# Patient Record
Sex: Female | Born: 1985 | Race: Black or African American | Hispanic: No | Smoking: Never smoker
Health system: Southern US, Community
[De-identification: ages and names within clinical notes are randomized; demographics above are authoritative.]

## PROBLEM LIST (undated history)

## (undated) ENCOUNTER — Inpatient Hospital Stay (HOSPITAL_COMMUNITY): Payer: Self-pay

## (undated) ENCOUNTER — Emergency Department (HOSPITAL_COMMUNITY): Payer: 59

## (undated) DIAGNOSIS — N83209 Unspecified ovarian cyst, unspecified side: Secondary | ICD-10-CM

## (undated) DIAGNOSIS — Z90710 Acquired absence of both cervix and uterus: Secondary | ICD-10-CM

## (undated) DIAGNOSIS — N760 Acute vaginitis: Secondary | ICD-10-CM

## (undated) DIAGNOSIS — B9689 Other specified bacterial agents as the cause of diseases classified elsewhere: Secondary | ICD-10-CM

## (undated) DIAGNOSIS — N736 Female pelvic peritoneal adhesions (postinfective): Secondary | ICD-10-CM

## (undated) DIAGNOSIS — IMO0002 Reserved for concepts with insufficient information to code with codable children: Secondary | ICD-10-CM

## (undated) DIAGNOSIS — R87619 Unspecified abnormal cytological findings in specimens from cervix uteri: Secondary | ICD-10-CM

## (undated) DIAGNOSIS — Z9289 Personal history of other medical treatment: Secondary | ICD-10-CM

## (undated) DIAGNOSIS — Z87442 Personal history of urinary calculi: Secondary | ICD-10-CM

## (undated) DIAGNOSIS — F410 Panic disorder [episodic paroxysmal anxiety] without agoraphobia: Secondary | ICD-10-CM

## (undated) DIAGNOSIS — B009 Herpesviral infection, unspecified: Secondary | ICD-10-CM

## (undated) DIAGNOSIS — J302 Other seasonal allergic rhinitis: Secondary | ICD-10-CM

## (undated) DIAGNOSIS — R51 Headache: Secondary | ICD-10-CM

## (undated) DIAGNOSIS — D649 Anemia, unspecified: Secondary | ICD-10-CM

## (undated) HISTORY — DX: Reserved for concepts with insufficient information to code with codable children: IMO0002

## (undated) HISTORY — DX: Personal history of urinary calculi: Z87.442

## (undated) HISTORY — PX: WISDOM TOOTH EXTRACTION: SHX21

## (undated) HISTORY — DX: Acute vaginitis: N76.0

## (undated) HISTORY — DX: Other specified bacterial agents as the cause of diseases classified elsewhere: B96.89

## (undated) HISTORY — PX: ABDOMINAL HYSTERECTOMY: SHX81

## (undated) HISTORY — DX: Unspecified abnormal cytological findings in specimens from cervix uteri: R87.619

---
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
## 2005-09-14 ENCOUNTER — Emergency Department (HOSPITAL_COMMUNITY): Admission: EM | Admit: 2005-09-14 | Discharge: 2005-09-15 | Payer: Self-pay | Admitting: Emergency Medicine

## 2005-10-28 ENCOUNTER — Emergency Department (HOSPITAL_COMMUNITY): Admission: EM | Admit: 2005-10-28 | Discharge: 2005-10-28 | Payer: Self-pay | Admitting: Emergency Medicine

## 2006-04-19 ENCOUNTER — Emergency Department (HOSPITAL_COMMUNITY): Admission: EM | Admit: 2006-04-19 | Discharge: 2006-04-19 | Payer: Self-pay | Admitting: Emergency Medicine

## 2006-11-09 ENCOUNTER — Emergency Department (HOSPITAL_COMMUNITY): Admission: EM | Admit: 2006-11-09 | Discharge: 2006-11-09 | Payer: Self-pay | Admitting: Emergency Medicine

## 2006-11-12 ENCOUNTER — Emergency Department (HOSPITAL_COMMUNITY): Admission: EM | Admit: 2006-11-12 | Discharge: 2006-11-12 | Payer: Self-pay | Admitting: *Deleted

## 2007-05-02 ENCOUNTER — Ambulatory Visit (HOSPITAL_COMMUNITY): Admission: RE | Admit: 2007-05-02 | Discharge: 2007-05-02 | Payer: Self-pay | Admitting: Internal Medicine

## 2008-08-13 ENCOUNTER — Inpatient Hospital Stay (HOSPITAL_COMMUNITY): Admission: AD | Admit: 2008-08-13 | Discharge: 2008-08-13 | Payer: Self-pay | Admitting: Obstetrics & Gynecology

## 2008-08-29 ENCOUNTER — Emergency Department (HOSPITAL_COMMUNITY): Admission: EM | Admit: 2008-08-29 | Discharge: 2008-08-29 | Payer: Self-pay | Admitting: Emergency Medicine

## 2008-10-26 ENCOUNTER — Inpatient Hospital Stay (HOSPITAL_COMMUNITY): Admission: AD | Admit: 2008-10-26 | Discharge: 2008-10-26 | Payer: Self-pay | Admitting: Obstetrics and Gynecology

## 2008-11-30 ENCOUNTER — Ambulatory Visit (HOSPITAL_COMMUNITY): Admission: RE | Admit: 2008-11-30 | Discharge: 2008-11-30 | Payer: Self-pay | Admitting: Obstetrics and Gynecology

## 2009-01-09 ENCOUNTER — Inpatient Hospital Stay (HOSPITAL_COMMUNITY): Admission: AD | Admit: 2009-01-09 | Discharge: 2009-01-09 | Payer: Self-pay | Admitting: Obstetrics and Gynecology

## 2009-03-30 ENCOUNTER — Emergency Department (HOSPITAL_COMMUNITY): Admission: EM | Admit: 2009-03-30 | Discharge: 2009-03-30 | Payer: Self-pay | Admitting: Emergency Medicine

## 2009-04-08 ENCOUNTER — Observation Stay (HOSPITAL_COMMUNITY): Admission: AD | Admit: 2009-04-08 | Discharge: 2009-04-08 | Payer: Self-pay | Admitting: Obstetrics and Gynecology

## 2009-04-12 ENCOUNTER — Inpatient Hospital Stay (HOSPITAL_COMMUNITY): Admission: AD | Admit: 2009-04-12 | Discharge: 2009-04-17 | Payer: Self-pay | Admitting: Obstetrics and Gynecology

## 2009-04-14 ENCOUNTER — Encounter (INDEPENDENT_AMBULATORY_CARE_PROVIDER_SITE_OTHER): Payer: Self-pay | Admitting: Obstetrics and Gynecology

## 2009-04-22 ENCOUNTER — Inpatient Hospital Stay (HOSPITAL_COMMUNITY): Admission: AD | Admit: 2009-04-22 | Discharge: 2009-04-22 | Payer: Self-pay | Admitting: Obstetrics and Gynecology

## 2009-06-15 ENCOUNTER — Emergency Department (HOSPITAL_COMMUNITY): Admission: EM | Admit: 2009-06-15 | Discharge: 2009-06-15 | Payer: Self-pay | Admitting: Family Medicine

## 2009-09-09 ENCOUNTER — Emergency Department (HOSPITAL_COMMUNITY): Admission: EM | Admit: 2009-09-09 | Discharge: 2009-09-09 | Payer: Self-pay | Admitting: Emergency Medicine

## 2010-02-16 ENCOUNTER — Encounter: Payer: Self-pay | Admitting: Internal Medicine

## 2010-04-11 LAB — POCT I-STAT, CHEM 8
BUN: 14 mg/dL (ref 6–23)
Calcium, Ion: 1.21 mmol/L (ref 1.12–1.32)
Chloride: 107 mEq/L (ref 96–112)
Creatinine, Ser: 0.9 mg/dL (ref 0.4–1.2)
Glucose, Bld: 92 mg/dL (ref 70–99)
HCT: 43 % (ref 36.0–46.0)
Hemoglobin: 14.6 g/dL (ref 12.0–15.0)
Potassium: 3.9 mEq/L (ref 3.5–5.1)
Sodium: 141 mEq/L (ref 135–145)
TCO2: 25 mmol/L (ref 0–100)

## 2010-04-11 LAB — PREGNANCY, URINE: Preg Test, Ur: NEGATIVE

## 2010-04-14 LAB — CULTURE, ROUTINE-ABSCESS: Gram Stain: NONE SEEN

## 2010-04-21 LAB — CBC
HCT: 34.3 % — ABNORMAL LOW (ref 36.0–46.0)
HCT: 34.8 % — ABNORMAL LOW (ref 36.0–46.0)
HCT: 36.2 % (ref 36.0–46.0)
HCT: 37.7 % (ref 36.0–46.0)
Hemoglobin: 11.6 g/dL — ABNORMAL LOW (ref 12.0–15.0)
Hemoglobin: 11.8 g/dL — ABNORMAL LOW (ref 12.0–15.0)
Hemoglobin: 12.3 g/dL (ref 12.0–15.0)
Hemoglobin: 12.6 g/dL (ref 12.0–15.0)
MCHC: 33.4 g/dL (ref 30.0–36.0)
MCHC: 33.8 g/dL (ref 30.0–36.0)
MCHC: 33.9 g/dL (ref 30.0–36.0)
MCHC: 33.9 g/dL (ref 30.0–36.0)
MCV: 97.4 fL (ref 78.0–100.0)
MCV: 97.6 fL (ref 78.0–100.0)
MCV: 98.3 fL (ref 78.0–100.0)
MCV: 98.8 fL (ref 78.0–100.0)
Platelets: 199 10*3/uL (ref 150–400)
Platelets: 222 10*3/uL (ref 150–400)
Platelets: 228 10*3/uL (ref 150–400)
Platelets: 445 10*3/uL — ABNORMAL HIGH (ref 150–400)
RBC: 3.51 MIL/uL — ABNORMAL LOW (ref 3.87–5.11)
RBC: 3.54 MIL/uL — ABNORMAL LOW (ref 3.87–5.11)
RBC: 3.72 MIL/uL — ABNORMAL LOW (ref 3.87–5.11)
RBC: 3.81 MIL/uL — ABNORMAL LOW (ref 3.87–5.11)
RDW: 13.1 % (ref 11.5–15.5)
RDW: 13.9 % (ref 11.5–15.5)
RDW: 14.1 % (ref 11.5–15.5)
RDW: 14.2 % (ref 11.5–15.5)
WBC: 14.9 10*3/uL — ABNORMAL HIGH (ref 4.0–10.5)
WBC: 15.8 10*3/uL — ABNORMAL HIGH (ref 4.0–10.5)
WBC: 8.1 10*3/uL (ref 4.0–10.5)
WBC: 9 10*3/uL (ref 4.0–10.5)

## 2010-04-21 LAB — URINALYSIS, ROUTINE W REFLEX MICROSCOPIC
Bilirubin Urine: NEGATIVE
Glucose, UA: NEGATIVE mg/dL
Ketones, ur: NEGATIVE mg/dL
Leukocytes, UA: NEGATIVE
Nitrite: NEGATIVE
Protein, ur: NEGATIVE mg/dL
Specific Gravity, Urine: 1.025 (ref 1.005–1.030)
Urobilinogen, UA: 0.2 mg/dL (ref 0.0–1.0)
pH: 5.5 (ref 5.0–8.0)

## 2010-04-21 LAB — RPR
RPR Ser Ql: NONREACTIVE
RPR Ser Ql: NONREACTIVE

## 2010-04-21 LAB — URINALYSIS, DIPSTICK ONLY
Bilirubin Urine: NEGATIVE
Glucose, UA: 250 mg/dL — AB
Ketones, ur: NEGATIVE mg/dL
Leukocytes, UA: NEGATIVE
Nitrite: NEGATIVE
Protein, ur: NEGATIVE mg/dL
Specific Gravity, Urine: 1.01 (ref 1.005–1.030)
Urobilinogen, UA: 0.2 mg/dL (ref 0.0–1.0)
pH: 7 (ref 5.0–8.0)

## 2010-04-21 LAB — URINE MICROSCOPIC-ADD ON

## 2010-04-21 LAB — COMPREHENSIVE METABOLIC PANEL
ALT: 20 U/L (ref 0–35)
AST: 23 U/L (ref 0–37)
Albumin: 3.1 g/dL — ABNORMAL LOW (ref 3.5–5.2)
Alkaline Phosphatase: 104 U/L (ref 39–117)
BUN: 13 mg/dL (ref 6–23)
CO2: 23 mEq/L (ref 19–32)
Calcium: 9.4 mg/dL (ref 8.4–10.5)
Chloride: 108 mEq/L (ref 96–112)
Creatinine, Ser: 0.71 mg/dL (ref 0.4–1.2)
GFR calc Af Amer: >60 mL/min (ref >60)
GFR calc non Af Amer: >60 mL/min (ref >60)
Glucose, Bld: 75 mg/dL (ref 70–99)
Potassium: 4.2 mEq/L (ref 3.5–5.1)
Sodium: 140 mEq/L (ref 135–145)
Total Bilirubin: 0.7 mg/dL (ref 0.3–1.2)
Total Protein: 6.4 g/dL (ref 6.0–8.3)

## 2010-04-21 LAB — URINE CULTURE
Colony Count: NO GROWTH
Culture: NO GROWTH
Special Requests: NEGATIVE

## 2010-04-21 LAB — CCBB MATERNAL DONOR DRAW

## 2010-04-21 LAB — URIC ACID: Uric Acid, Serum: 5.2 mg/dL (ref 2.4–7.0)

## 2010-04-29 LAB — URINALYSIS, ROUTINE W REFLEX MICROSCOPIC
Bilirubin Urine: NEGATIVE
Glucose, UA: 500 mg/dL — AB
Hgb urine dipstick: NEGATIVE
Ketones, ur: NEGATIVE mg/dL
Nitrite: NEGATIVE
Protein, ur: NEGATIVE mg/dL
Specific Gravity, Urine: 1.015 (ref 1.005–1.030)
Urobilinogen, UA: 0.2 mg/dL (ref 0.0–1.0)
pH: 6.5 (ref 5.0–8.0)

## 2010-04-29 LAB — CBC
HCT: 32 % — ABNORMAL LOW (ref 36.0–46.0)
Hemoglobin: 11 g/dL — ABNORMAL LOW (ref 12.0–15.0)
MCHC: 34.3 g/dL (ref 30.0–36.0)
MCV: 98.9 fL (ref 78.0–100.0)
Platelets: 240 10*3/uL (ref 150–400)
RBC: 3.23 MIL/uL — ABNORMAL LOW (ref 3.87–5.11)
RDW: 12.8 % (ref 11.5–15.5)
WBC: 10.6 10*3/uL — ABNORMAL HIGH (ref 4.0–10.5)

## 2010-04-29 LAB — GLUCOSE, CAPILLARY: Glucose-Capillary: 93 mg/dL (ref 70–99)

## 2010-05-03 LAB — URINALYSIS, ROUTINE W REFLEX MICROSCOPIC
Bilirubin Urine: NEGATIVE
Glucose, UA: NEGATIVE mg/dL
Ketones, ur: NEGATIVE mg/dL
Nitrite: NEGATIVE
Protein, ur: 100 mg/dL — AB
Specific Gravity, Urine: 1.021 (ref 1.005–1.030)
Urobilinogen, UA: 1 mg/dL (ref 0.0–1.0)
pH: 6.5 (ref 5.0–8.0)

## 2010-05-03 LAB — WET PREP, GENITAL
Clue Cells Wet Prep HPF POC: NONE SEEN
Trich, Wet Prep: NONE SEEN
Yeast Wet Prep HPF POC: NONE SEEN

## 2010-05-03 LAB — RPR: RPR Ser Ql: NONREACTIVE

## 2010-05-03 LAB — GC/CHLAMYDIA PROBE AMP, GENITAL
Chlamydia, DNA Probe: NEGATIVE
GC Probe Amp, Genital: NEGATIVE

## 2010-05-03 LAB — PREGNANCY, URINE: Preg Test, Ur: POSITIVE

## 2010-05-03 LAB — URINE MICROSCOPIC-ADD ON

## 2010-05-04 LAB — DIFFERENTIAL
Band Neutrophils: 0 % (ref 0–10)
Basophils Absolute: 0 10*3/uL (ref 0.0–0.1)
Basophils Relative: 0 % (ref 0–1)
Eosinophils Absolute: 0 10*3/uL (ref 0.0–0.7)
Eosinophils Relative: 0 % (ref 0–5)
Lymphocytes Relative: 34 % (ref 12–46)
Lymphs Abs: 2.8 10*3/uL (ref 0.7–4.0)
Monocytes Absolute: 0.2 10*3/uL (ref 0.1–1.0)
Monocytes Relative: 3 % (ref 3–12)
Neutro Abs: 5.2 10*3/uL (ref 1.7–7.7)
Neutrophils Relative %: 63 % (ref 43–77)

## 2010-05-04 LAB — URINALYSIS, ROUTINE W REFLEX MICROSCOPIC
Bilirubin Urine: NEGATIVE
Glucose, UA: NEGATIVE mg/dL
Hgb urine dipstick: NEGATIVE
Ketones, ur: NEGATIVE mg/dL
Nitrite: NEGATIVE
Protein, ur: NEGATIVE mg/dL
Specific Gravity, Urine: 1.01 (ref 1.005–1.030)
Urobilinogen, UA: 0.2 mg/dL (ref 0.0–1.0)
pH: 7 (ref 5.0–8.0)

## 2010-05-04 LAB — ABO/RH: ABO/RH(D): O POS

## 2010-05-04 LAB — CBC
HCT: 34.2 % — ABNORMAL LOW (ref 36.0–46.0)
Hemoglobin: 11.9 g/dL — ABNORMAL LOW (ref 12.0–15.0)
MCHC: 34.8 g/dL (ref 30.0–36.0)
MCV: 94.8 fL (ref 78.0–100.0)
Platelets: 262 10*3/uL (ref 150–400)
RBC: 3.61 MIL/uL — ABNORMAL LOW (ref 3.87–5.11)
RDW: 11.7 % (ref 11.5–15.5)
WBC: 8.2 10*3/uL (ref 4.0–10.5)

## 2010-05-04 LAB — WET PREP, GENITAL
Trich, Wet Prep: NONE SEEN
Yeast Wet Prep HPF POC: NONE SEEN

## 2010-05-04 LAB — HCG, QUANTITATIVE, PREGNANCY: hCG, Beta Chain, Quant, S: 15776 m[IU]/mL — ABNORMAL HIGH (ref <5)

## 2010-06-06 DIAGNOSIS — G43909 Migraine, unspecified, not intractable, without status migrainosus: Secondary | ICD-10-CM | POA: Insufficient documentation

## 2010-10-25 IMAGING — US US OB TRANSVAGINAL MODIFY
1 series · 14 of 28 positions shown · non-contrast
Comparison: None

CLINICAL DATA: Early pregnancy, severe pain, no quantitative beta
HCG available for correlation

OBSTETRIC <14 WK US AND TRANSVAGINAL OB US
TECHNIQUE: Both transabdominal and transvaginal ultrasound
examinations were performed for complete evaluation of the
gestation as well as the maternal uterus, adnexal regions, and
pelvic cul-de-sac.

[Series 1: us ob comp less 14 wks · 0.24mm/px · 14 of 36 slices shown]
[im 2/36]
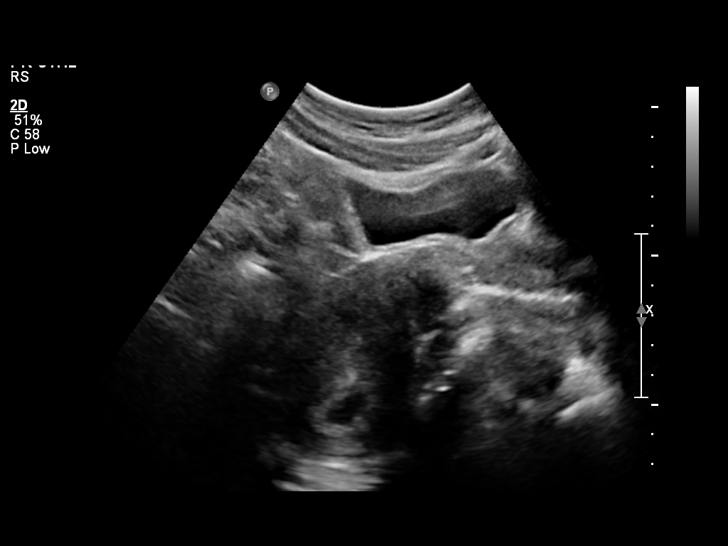
[im 4/36]
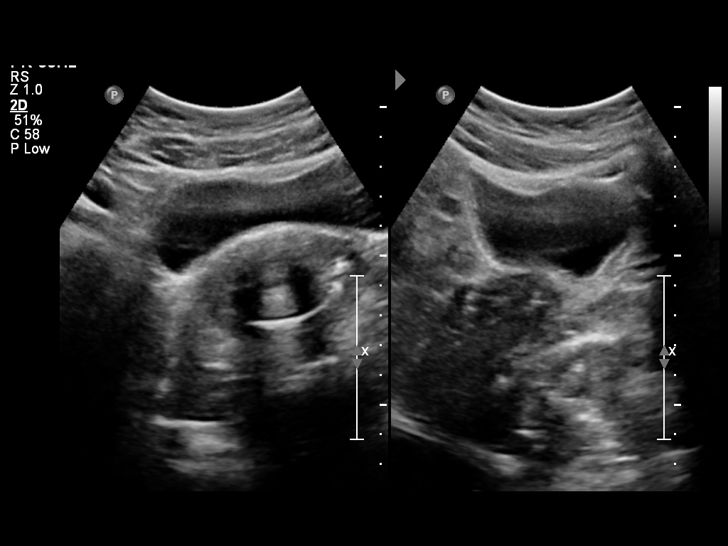
[im 7/36]
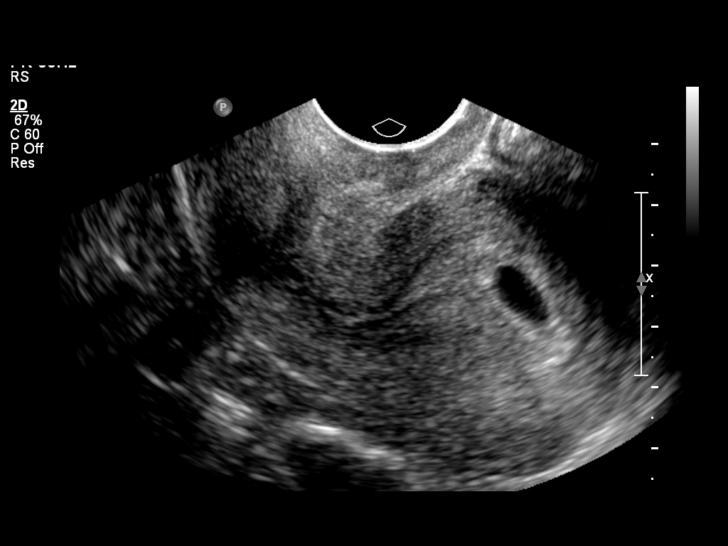
[im 10/36]
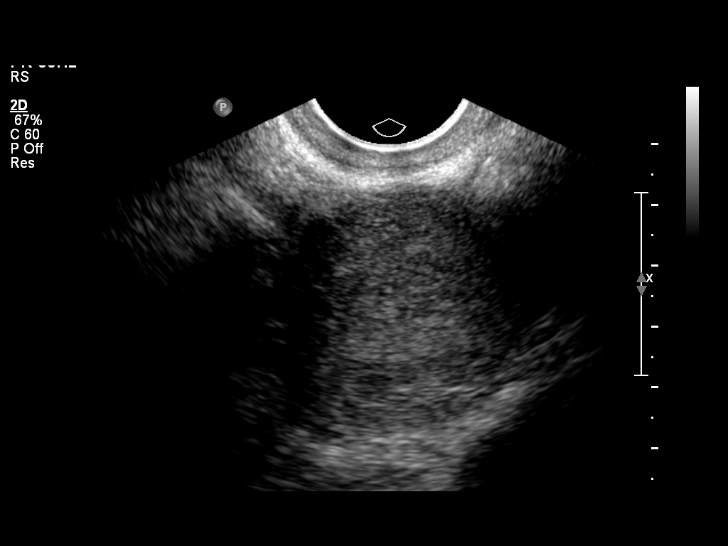
[im 12/36]
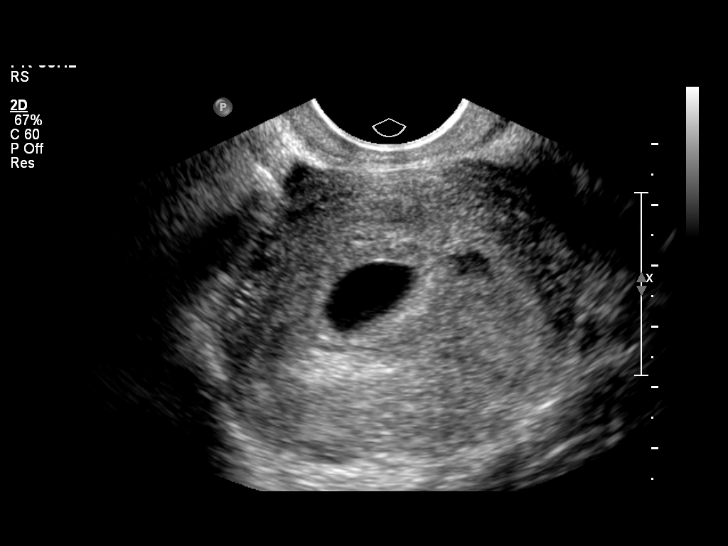
[im 15/36]
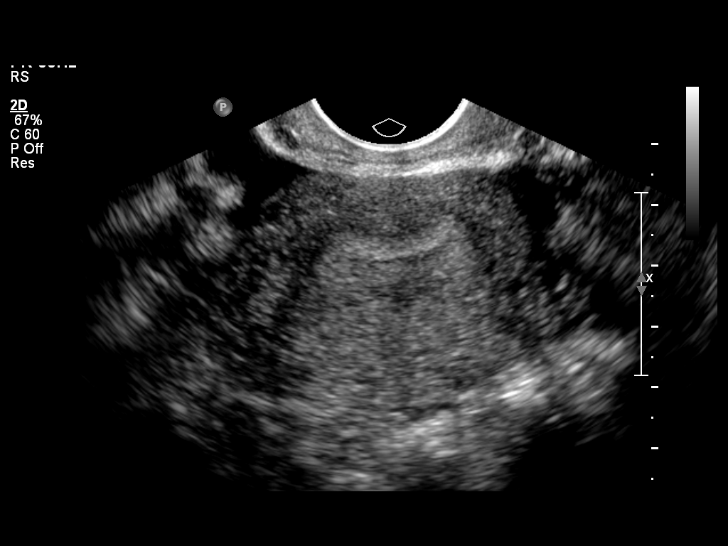
[im 17/36]
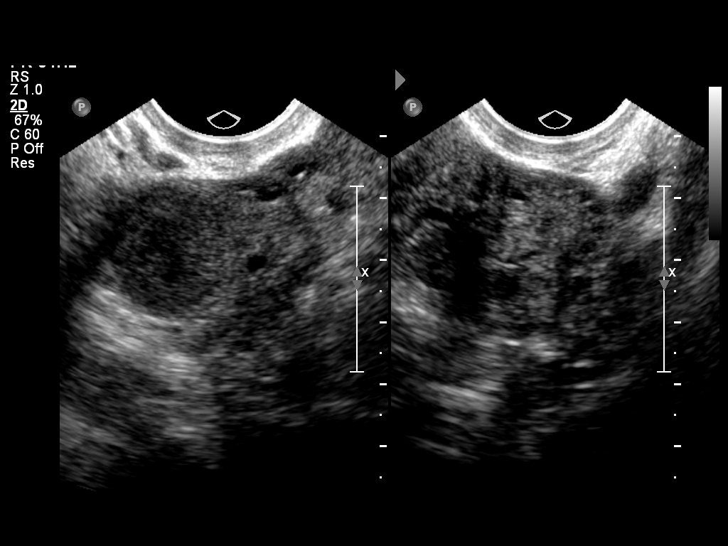
[im 20/36]
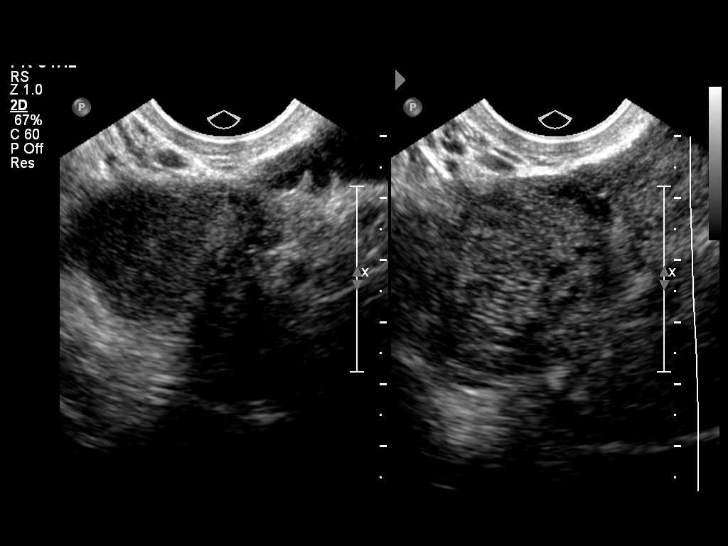
[im 23/36]
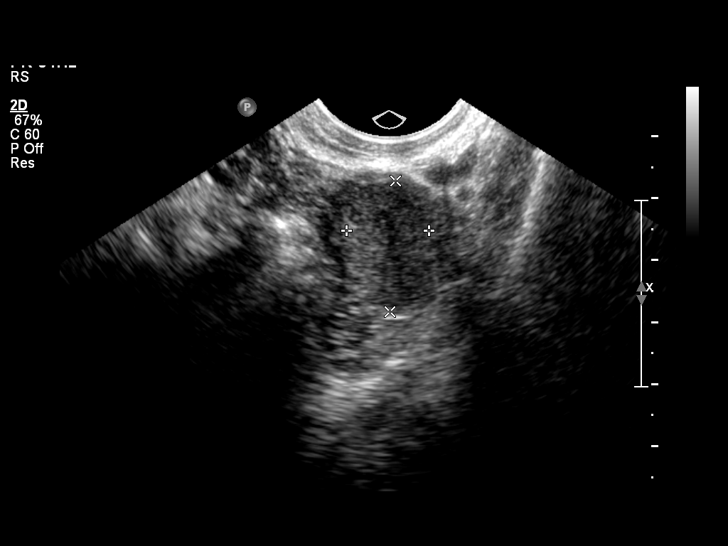
[im 25/36]
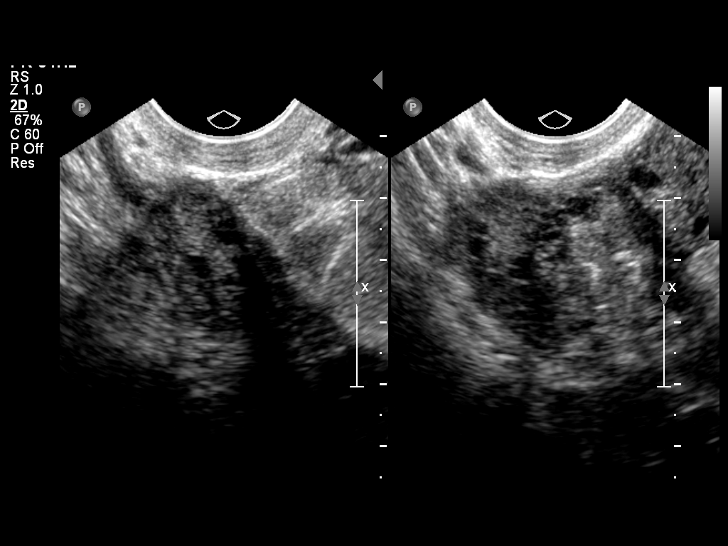
[im 28/36]
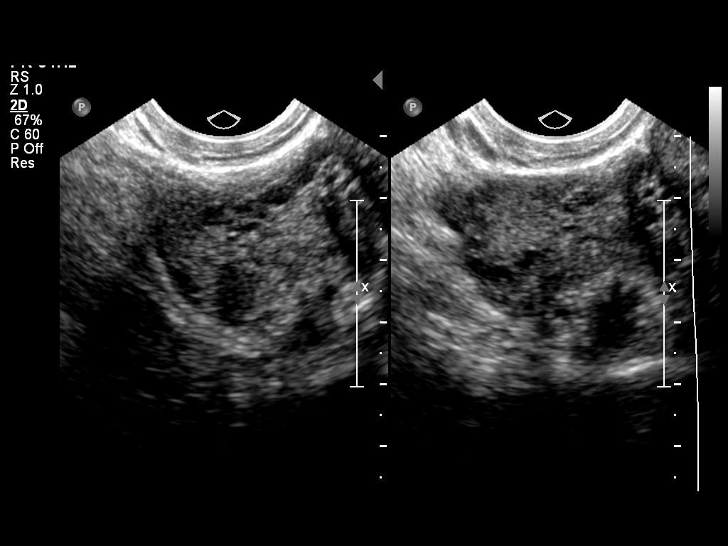
[im 30/36]
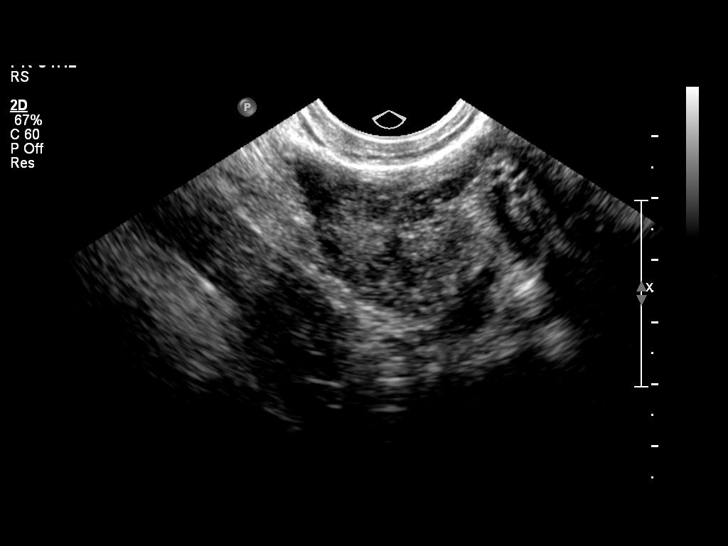
[im 33/36]
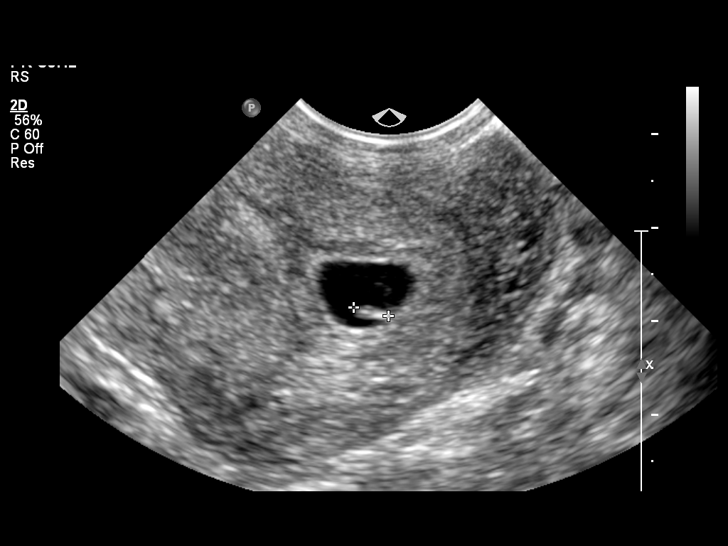
[im 36/36]
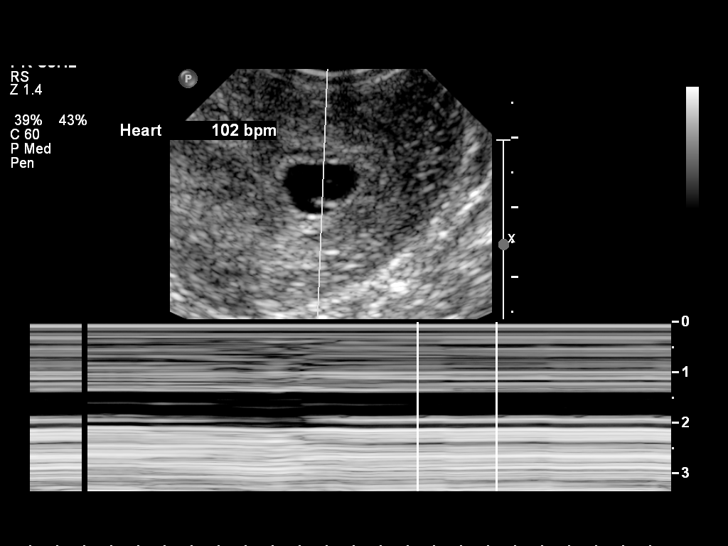

[14 of 28 positions shown; findings below may reference images not displayed]

Intrauterine gestational sac: Present
Yolk sac: Present
Embryo: Present
Cardiac Activity: Present
Heart Rate: 102 bpm

CRL: 4 mm           6 w    1 d            US EDC: 04/07/2009

Maternal uterus/adnexae:
Small subchorionic hemorrhage.
Right ovary normal size and morphology, 1.5 x 2.9 x 1.8 cm.
Left ovary normal size and morphology, 3.1 x 3.0 x 2.0 cm,
containing a 2 cm hemorrhagic corpus luteum.
No free pelvic fluid or adnexal masses.
IMPRESSION: Single live early intrauterine gestation measured at 6 weeks 1 day
EGA.
Very small subchorionic hemorrhage.
Correlation with quantitative beta HCG recommended when available.

## 2010-11-06 DIAGNOSIS — N200 Calculus of kidney: Secondary | ICD-10-CM | POA: Insufficient documentation

## 2010-11-06 DIAGNOSIS — Z98891 History of uterine scar from previous surgery: Secondary | ICD-10-CM | POA: Insufficient documentation

## 2010-11-06 LAB — OB RESULTS CONSOLE RPR: RPR: NONREACTIVE

## 2010-11-06 LAB — OB RESULTS CONSOLE RUBELLA ANTIBODY, IGM: Rubella: IMMUNE

## 2010-11-06 LAB — OB RESULTS CONSOLE ABO/RH: RH Type: POSITIVE

## 2010-11-06 LAB — OB RESULTS CONSOLE ANTIBODY SCREEN: Antibody Screen: NEGATIVE

## 2010-11-06 LAB — OB RESULTS CONSOLE HIV ANTIBODY (ROUTINE TESTING): HIV: NONREACTIVE

## 2010-11-06 LAB — OB RESULTS CONSOLE HEPATITIS B SURFACE ANTIGEN: Hepatitis B Surface Ag: NEGATIVE

## 2010-12-09 ENCOUNTER — Inpatient Hospital Stay (HOSPITAL_COMMUNITY)
Admission: AD | Admit: 2010-12-09 | Discharge: 2010-12-09 | Disposition: A | Discharge status: Home / Self Care | Payer: Medicaid Other | Source: Ambulatory Visit | Attending: Obstetrics and Gynecology | Admitting: Obstetrics and Gynecology

## 2010-12-09 ENCOUNTER — Encounter (HOSPITAL_COMMUNITY): Payer: Self-pay

## 2010-12-09 DIAGNOSIS — O99891 Other specified diseases and conditions complicating pregnancy: Secondary | ICD-10-CM | POA: Insufficient documentation

## 2010-12-09 DIAGNOSIS — R109 Unspecified abdominal pain: Secondary | ICD-10-CM | POA: Insufficient documentation

## 2010-12-09 DIAGNOSIS — O210 Mild hyperemesis gravidarum: Secondary | ICD-10-CM | POA: Insufficient documentation

## 2010-12-09 HISTORY — DX: Panic disorder (episodic paroxysmal anxiety): F41.0

## 2010-12-09 LAB — DIFFERENTIAL
Basophils Absolute: 0 10*3/uL (ref 0.0–0.1)
Basophils Relative: 1 % (ref 0–1)
Eosinophils Absolute: 0.1 10*3/uL (ref 0.0–0.7)
Eosinophils Relative: 1 % (ref 0–5)
Lymphocytes Relative: 25 % (ref 12–46)
Lymphs Abs: 2.2 10*3/uL (ref 0.7–4.0)
Monocytes Absolute: 0.5 10*3/uL (ref 0.1–1.0)
Monocytes Relative: 6 % (ref 3–12)
Neutro Abs: 6 10*3/uL (ref 1.7–7.7)
Neutrophils Relative %: 68 % (ref 43–77)

## 2010-12-09 LAB — WET PREP, GENITAL
Clue Cells Wet Prep HPF POC: NONE SEEN
Trich, Wet Prep: NONE SEEN
Yeast Wet Prep HPF POC: NONE SEEN

## 2010-12-09 LAB — URINALYSIS, ROUTINE W REFLEX MICROSCOPIC
Bilirubin Urine: NEGATIVE
Glucose, UA: NEGATIVE mg/dL
Hgb urine dipstick: NEGATIVE
Ketones, ur: NEGATIVE mg/dL
Leukocytes, UA: NEGATIVE
Nitrite: NEGATIVE
Protein, ur: NEGATIVE mg/dL
Specific Gravity, Urine: 1.02 (ref 1.005–1.030)
Urobilinogen, UA: 0.2 mg/dL (ref 0.0–1.0)
pH: 7.5 (ref 5.0–8.0)

## 2010-12-09 LAB — CBC
HCT: 33.7 % — ABNORMAL LOW (ref 36.0–46.0)
Hemoglobin: 11.2 g/dL — ABNORMAL LOW (ref 12.0–15.0)
MCH: 31.2 pg (ref 26.0–34.0)
MCHC: 33.2 g/dL (ref 30.0–36.0)
MCV: 93.9 fL (ref 78.0–100.0)
Platelets: 267 10*3/uL (ref 150–400)
RBC: 3.59 MIL/uL — ABNORMAL LOW (ref 3.87–5.11)
RDW: 12.1 % (ref 11.5–15.5)
WBC: 8.8 10*3/uL (ref 4.0–10.5)

## 2010-12-09 MED ORDER — ACETAMINOPHEN 325 MG PO TABS
650.0000 mg | ORAL_TABLET | Freq: Once | ORAL | Status: AC
  Administered 2010-12-09: 650 mg via ORAL
  Filled 2010-12-09: qty 2

## 2010-12-09 NOTE — Progress Notes (Signed)
Patient states she has been having lower abdominal cramping since last night. Has had nausea for a while. Has a Rx for Zofran but has not had it filled. Has had an increase in vaginal discharge.
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
## 2010-12-10 LAB — GC/CHLAMYDIA PROBE AMP, GENITAL
Chlamydia, DNA Probe: NEGATIVE
GC Probe Amp, Genital: NEGATIVE

## 2011-02-09 ENCOUNTER — Encounter (HOSPITAL_COMMUNITY): Payer: Self-pay | Admitting: *Deleted

## 2011-02-09 ENCOUNTER — Inpatient Hospital Stay (HOSPITAL_COMMUNITY)
Admission: AD | Admit: 2011-02-09 | Discharge: 2011-02-09 | Disposition: A | Discharge status: Home / Self Care | Payer: Medicaid Other | Source: Ambulatory Visit | Attending: Obstetrics and Gynecology | Admitting: Obstetrics and Gynecology

## 2011-02-09 DIAGNOSIS — M549 Dorsalgia, unspecified: Secondary | ICD-10-CM

## 2011-02-09 DIAGNOSIS — R51 Headache: Secondary | ICD-10-CM | POA: Insufficient documentation

## 2011-02-09 DIAGNOSIS — R109 Unspecified abdominal pain: Secondary | ICD-10-CM | POA: Insufficient documentation

## 2011-02-09 DIAGNOSIS — K5900 Constipation, unspecified: Secondary | ICD-10-CM | POA: Insufficient documentation

## 2011-02-09 DIAGNOSIS — O99891 Other specified diseases and conditions complicating pregnancy: Secondary | ICD-10-CM | POA: Insufficient documentation

## 2011-02-09 DIAGNOSIS — O26899 Other specified pregnancy related conditions, unspecified trimester: Secondary | ICD-10-CM

## 2011-02-09 DIAGNOSIS — R519 Headache, unspecified: Secondary | ICD-10-CM

## 2011-02-09 HISTORY — DX: Unspecified ovarian cyst, unspecified side: N83.209

## 2011-02-09 HISTORY — DX: Headache: R51

## 2011-02-09 LAB — URINALYSIS, ROUTINE W REFLEX MICROSCOPIC
Bilirubin Urine: NEGATIVE
Glucose, UA: NEGATIVE mg/dL
Hgb urine dipstick: NEGATIVE
Ketones, ur: NEGATIVE mg/dL
Leukocytes, UA: NEGATIVE
Nitrite: NEGATIVE
Protein, ur: NEGATIVE mg/dL
Specific Gravity, Urine: 1.01 (ref 1.005–1.030)
Urobilinogen, UA: 0.2 mg/dL (ref 0.0–1.0)
pH: 6 (ref 5.0–8.0)

## 2011-02-09 LAB — WET PREP, GENITAL
Trich, Wet Prep: NONE SEEN
Yeast Wet Prep HPF POC: NONE SEEN

## 2011-02-09 MED ORDER — CYCLOBENZAPRINE HCL 10 MG PO TABS: 10.0000 mg | ORAL_TABLET | Freq: Two times a day (BID) | ORAL | Status: AC | PRN

## 2011-02-09 MED ORDER — IBUPROFEN 800 MG PO TABS
800.0000 mg | ORAL_TABLET | Freq: Once | ORAL | Status: AC
  Administered 2011-02-09: 800 mg via ORAL
  Filled 2011-02-09: qty 1

## 2011-02-09 MED ORDER — CYCLOBENZAPRINE HCL 10 MG PO TABS
10.0000 mg | ORAL_TABLET | Freq: Once | ORAL | Status: AC
  Administered 2011-02-09: 10 mg via ORAL
  Filled 2011-02-09: qty 1

## 2011-02-09 NOTE — Progress Notes (Signed)
Pt states she has been cramping x 1 month-it is worse, she if feeling more pressure and states it "hurts" to urinate but does not feel like a UTI
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
## 2011-02-10 LAB — GC/CHLAMYDIA PROBE AMP, GENITAL
Chlamydia, DNA Probe: NEGATIVE
GC Probe Amp, Genital: NEGATIVE

## 2011-03-25 ENCOUNTER — Other Ambulatory Visit: Payer: Medicaid Other

## 2011-03-25 ENCOUNTER — Encounter (INDEPENDENT_AMBULATORY_CARE_PROVIDER_SITE_OTHER): Payer: Medicaid Other | Admitting: Obstetrics and Gynecology

## 2011-03-25 DIAGNOSIS — Z331 Pregnant state, incidental: Secondary | ICD-10-CM

## 2011-04-01 ENCOUNTER — Encounter (HOSPITAL_COMMUNITY): Payer: Self-pay | Admitting: *Deleted

## 2011-04-01 ENCOUNTER — Inpatient Hospital Stay (HOSPITAL_COMMUNITY)
Admission: AD | Admit: 2011-04-01 | Discharge: 2011-04-01 | Disposition: A | Discharge status: Home / Self Care | Payer: Medicaid Other | Source: Ambulatory Visit | Attending: Obstetrics and Gynecology | Admitting: Obstetrics and Gynecology

## 2011-04-01 DIAGNOSIS — N949 Unspecified condition associated with female genital organs and menstrual cycle: Secondary | ICD-10-CM | POA: Insufficient documentation

## 2011-04-01 DIAGNOSIS — O99891 Other specified diseases and conditions complicating pregnancy: Secondary | ICD-10-CM | POA: Insufficient documentation

## 2011-04-01 LAB — URINALYSIS, ROUTINE W REFLEX MICROSCOPIC
Bilirubin Urine: NEGATIVE
Glucose, UA: NEGATIVE mg/dL
Hgb urine dipstick: NEGATIVE
Ketones, ur: NEGATIVE mg/dL
Leukocytes, UA: NEGATIVE
Nitrite: NEGATIVE
Protein, ur: NEGATIVE mg/dL
Specific Gravity, Urine: 1.025 (ref 1.005–1.030)
Urobilinogen, UA: 0.2 mg/dL (ref 0.0–1.0)
pH: 6 (ref 5.0–8.0)

## 2011-04-01 LAB — FETAL FIBRONECTIN: Fetal Fibronectin: NEGATIVE

## 2011-04-01 NOTE — Progress Notes (Signed)
Pt reprots having lower back and abd pain/cramping and pressure most of the morning. reprots good fetal movment

## 2011-04-01 NOTE — Discharge Instructions (Signed)
[name] will call your prescriptions to your pharmacy. Keep your scheduled appointment for prenatal care. Call the office or midwife on call with further concerns.

## 2011-04-01 NOTE — ED Provider Notes (Signed)
History   26 yo G2P1001 at 56 2/7 weeks presented c/o pelvic and hip pain, cramping.  Denies leaking, bleeding, reports + FM.  Pregnancy remarkable for: Previous C/S for FTP, plans repeat Hx kidney stones Hx migraines Recent tooth extraction--currently on Clindamycin Hx anxiety/panic  Chief Complaint  Patient presents with  . Abdominal Pain     OB History    Grav Para Term Preterm Abortions TAB SAB Ect Mult Living   2 1 1       1       Past Medical History  Diagnosis Date  . Kidney stones   . Panic attack   . Kidney stones   . Ovarian cyst   . Headache   . Migraine     Past Surgical History  Procedure Date  . Cesarean section   . Wisdom tooth extraction 2008    Family History  Problem Relation Age of Onset  . Hypotension Neg Hx   . Anesthesia problems Neg Hx   . Malignant hyperthermia Neg Hx   . Pseudochol deficiency Neg Hx     History  Substance Use Topics  . Smoking status: Never Smoker   . Smokeless tobacco: Not on file  . Alcohol Use: No    Allergies: No Known Allergies  Prescriptions prior to admission  Medication Sig Dispense Refill  . clindamycin (CLEOCIN) 300 MG capsule Take 300 mg by mouth every 6 (six) hours. 7 days course, not yet completed      . HYDROcodone-acetaminophen (VICODIN) 5-500 MG per tablet Take 1 tablet by mouth every 6 (six) hours as needed. For tooth pain      . ibuprofen (ADVIL,MOTRIN) 600 MG tablet Take 600 mg by mouth every 6 (six) hours as needed. Patient took this medication for pain.      Marland Kitchen OVER THE COUNTER MEDICATION Take 2 tablets by mouth daily. Patient takes prenatal gummies         Physical Exam   Blood pressure 107/63, pulse 83, temperature 98.7 F (37.1 C), temperature source Oral, resp. rate 18, height 5' 2.5" (1.588 m), weight 76.204 kg (168 lb), last menstrual period 09/15/2010.  Chest clear Heart RRR without murmur Abd gravid, NT Pelvic:  Cervix closed, long, pp OOP Ext WNL  FFN negative FHR  reactive Mild irritability.  Results for orders placed during the hospital encounter of 04/01/11 (from the past 24 hour(s))  URINALYSIS, ROUTINE W REFLEX MICROSCOPIC     Status: Normal   Collection Time   04/01/11 12:20 PM      Component Value Range   Color, Urine YELLOW  YELLOW    APPearance CLEAR  CLEAR    Specific Gravity, Urine 1.025  1.005 - 1.030    pH 6.0  5.0 - 8.0    Glucose, UA NEGATIVE  NEGATIVE (mg/dL)   Hgb urine dipstick NEGATIVE  NEGATIVE    Bilirubin Urine NEGATIVE  NEGATIVE    Ketones, ur NEGATIVE  NEGATIVE (mg/dL)   Protein, ur NEGATIVE  NEGATIVE (mg/dL)   Urobilinogen, UA 0.2  0.0 - 1.0 (mg/dL)   Nitrite NEGATIVE  NEGATIVE    Leukocytes, UA NEGATIVE  NEGATIVE   FETAL FIBRONECTIN     Status: Normal   Collection Time   04/01/11 12:45 PM      Component Value Range   Fetal Fibronectin NEGATIVE  NEGATIVE      ED Course  IUP at 26 2/7 weeks Musculoskeletal discomforts of pregnancy Uterine irritability  Plan: D/C home with Rx for Procardia  10 mg po q 4-6 hours prn > 5 UCs per hour, Flexeril 10 mg po TID prn musculoskeletal discomforts. S/S PTL reviewed. Keep scheduled appointment at [facility] or call prn. Stop Motrin  Nigel Bridgeman, CNM, [place] 04/01/11 3pm

## 2011-04-17 ENCOUNTER — Encounter (INDEPENDENT_AMBULATORY_CARE_PROVIDER_SITE_OTHER): Payer: Medicaid Other

## 2011-04-17 DIAGNOSIS — Z331 Pregnant state, incidental: Secondary | ICD-10-CM

## 2011-04-17 DIAGNOSIS — J40 Bronchitis, not specified as acute or chronic: Secondary | ICD-10-CM

## 2011-04-20 ENCOUNTER — Encounter (INDEPENDENT_AMBULATORY_CARE_PROVIDER_SITE_OTHER): Payer: Medicaid Other | Admitting: Registered Nurse

## 2011-04-20 ENCOUNTER — Other Ambulatory Visit (INDEPENDENT_AMBULATORY_CARE_PROVIDER_SITE_OTHER): Payer: Medicaid Other

## 2011-04-20 DIAGNOSIS — O368190 Decreased fetal movements, unspecified trimester, not applicable or unspecified: Secondary | ICD-10-CM

## 2011-05-04 ENCOUNTER — Encounter (INDEPENDENT_AMBULATORY_CARE_PROVIDER_SITE_OTHER): Payer: Medicaid Other | Admitting: Obstetrics and Gynecology

## 2011-05-04 DIAGNOSIS — Z331 Pregnant state, incidental: Secondary | ICD-10-CM

## 2011-05-21 ENCOUNTER — Ambulatory Visit (INDEPENDENT_AMBULATORY_CARE_PROVIDER_SITE_OTHER): Payer: Medicaid Other | Admitting: Obstetrics and Gynecology

## 2011-05-21 ENCOUNTER — Encounter: Payer: Self-pay | Admitting: Obstetrics and Gynecology

## 2011-05-21 VITALS — BP 110/64 | Wt 178.0 lb

## 2011-05-21 DIAGNOSIS — Z331 Pregnant state, incidental: Secondary | ICD-10-CM

## 2011-05-21 DIAGNOSIS — Z9889 Other specified postprocedural states: Secondary | ICD-10-CM

## 2011-05-21 DIAGNOSIS — E663 Overweight: Secondary | ICD-10-CM | POA: Insufficient documentation

## 2011-05-21 DIAGNOSIS — Z98891 History of uterine scar from previous surgery: Secondary | ICD-10-CM

## 2011-05-21 LAB — OB RESULTS CONSOLE GBS: GBS: NEGATIVE

## 2011-05-21 NOTE — Progress Notes (Signed)
Pt c/o headache x 4 days some swelling, no blurred vision, some dizziness some relief with tylenol

## 2011-05-21 NOTE — Progress Notes (Signed)
GC, Chl, Bstrep sent. Wants me to do CS at 40 weeks. No BTL. Papers signed. Cx Cl50%/-3, vertex Reflexes nl, trace edema Abd nontender RTO 1 week.  AVS

## 2011-05-22 ENCOUNTER — Telehealth: Payer: Self-pay | Admitting: Obstetrics and Gynecology

## 2011-05-22 LAB — GC/CHLAMYDIA PROBE AMP, GENITAL
Chlamydia, DNA Probe: NEGATIVE
GC Probe Amp, Genital: NEGATIVE

## 2011-05-24 LAB — CULTURE, BETA STREP (GROUP B ONLY)

## 2011-05-25 ENCOUNTER — Other Ambulatory Visit: Payer: Self-pay | Admitting: Obstetrics and Gynecology

## 2011-05-27 ENCOUNTER — Ambulatory Visit (INDEPENDENT_AMBULATORY_CARE_PROVIDER_SITE_OTHER): Payer: Medicaid Other | Admitting: Obstetrics and Gynecology

## 2011-05-27 ENCOUNTER — Encounter: Payer: Medicaid Other | Admitting: Obstetrics and Gynecology

## 2011-05-27 VITALS — BP 100/70 | Wt 183.0 lb

## 2011-05-27 DIAGNOSIS — Z3490 Encounter for supervision of normal pregnancy, unspecified, unspecified trimester: Secondary | ICD-10-CM

## 2011-05-27 DIAGNOSIS — Z9289 Personal history of other medical treatment: Secondary | ICD-10-CM

## 2011-05-27 HISTORY — DX: Personal history of other medical treatment: Z92.89

## 2011-05-27 NOTE — Progress Notes (Signed)
Doing well, but ready for C/S scheduled on 5/31. No cervical change. Discussed labor s/s--to call if labor ensues. Reviewed negative GBS.

## 2011-05-28 NOTE — Progress Notes (Signed)
Addended by: Janine Limbo on: 05/28/2011 12:19 PM   Modules accepted: Orders

## 2011-05-28 NOTE — Progress Notes (Signed)
Addended by: Janine Limbo on: 05/28/2011 12:20 PM   Modules accepted: Orders

## 2011-06-04 ENCOUNTER — Ambulatory Visit (INDEPENDENT_AMBULATORY_CARE_PROVIDER_SITE_OTHER): Payer: Medicaid Other | Admitting: Obstetrics and Gynecology

## 2011-06-04 ENCOUNTER — Encounter: Payer: Self-pay | Admitting: Obstetrics and Gynecology

## 2011-06-04 VITALS — BP 108/70 | Wt 184.0 lb

## 2011-06-04 DIAGNOSIS — Z331 Pregnant state, incidental: Secondary | ICD-10-CM

## 2011-06-04 NOTE — Progress Notes (Signed)
PT WANT CERVIX CHECKED. PT STATES HAVING CONTRACTIONS EVERY 10 TO 12 MIN.

## 2011-06-04 NOTE — Progress Notes (Signed)
Complaints of contractions.  Cervix is closed.  Return office in 1 week.  C-section is scheduled.  Dr. Stefano Gaul

## 2011-06-08 ENCOUNTER — Encounter (HOSPITAL_COMMUNITY): Payer: Self-pay | Admitting: Pharmacist

## 2011-06-10 ENCOUNTER — Encounter: Payer: Self-pay | Admitting: Obstetrics and Gynecology

## 2011-06-10 DIAGNOSIS — N200 Calculus of kidney: Secondary | ICD-10-CM

## 2011-06-10 DIAGNOSIS — Z98891 History of uterine scar from previous surgery: Secondary | ICD-10-CM

## 2011-06-10 DIAGNOSIS — G43909 Migraine, unspecified, not intractable, without status migrainosus: Secondary | ICD-10-CM

## 2011-06-11 ENCOUNTER — Ambulatory Visit (INDEPENDENT_AMBULATORY_CARE_PROVIDER_SITE_OTHER): Payer: Medicaid Other | Admitting: Obstetrics and Gynecology

## 2011-06-11 VITALS — BP 110/60 | Wt 186.0 lb

## 2011-06-11 DIAGNOSIS — Z331 Pregnant state, incidental: Secondary | ICD-10-CM

## 2011-06-11 NOTE — Progress Notes (Signed)
Pt states she is having on and off contractions  Yesterday contractions were  3-5 mins apart then ceased for 1 hr.  Pt wants cervix checked today.

## 2011-06-11 NOTE — Progress Notes (Signed)
Cervix unchanged.  Cesarean at 39 weeks.  Return office in one week.

## 2011-06-15 ENCOUNTER — Telehealth: Payer: Self-pay | Admitting: Obstetrics and Gynecology

## 2011-06-15 ENCOUNTER — Encounter (HOSPITAL_COMMUNITY): Payer: Self-pay

## 2011-06-15 NOTE — Telephone Encounter (Signed)
Repeat C/S scheduled for 06/26/11 at 11:00 with AVS/cnm

## 2011-06-16 ENCOUNTER — Ambulatory Visit (INDEPENDENT_AMBULATORY_CARE_PROVIDER_SITE_OTHER): Payer: Medicaid Other | Admitting: Obstetrics and Gynecology

## 2011-06-16 VITALS — BP 112/72 | Wt 186.0 lb

## 2011-06-16 DIAGNOSIS — Z331 Pregnant state, incidental: Secondary | ICD-10-CM

## 2011-06-16 NOTE — Progress Notes (Signed)
C-section discussed.  Patient wants to maintain her current schedule for delivery.

## 2011-06-16 NOTE — Progress Notes (Signed)
Pt states she has no concerns today. Pt wants cervix checked today. 

## 2011-06-17 ENCOUNTER — Encounter (HOSPITAL_COMMUNITY)
Admission: RE | Admit: 2011-06-17 | Discharge: 2011-06-17 | Disposition: A | Discharge status: Home / Self Care | Payer: Medicaid Other | Source: Ambulatory Visit | Attending: Obstetrics and Gynecology | Admitting: Obstetrics and Gynecology

## 2011-06-17 ENCOUNTER — Encounter (HOSPITAL_COMMUNITY): Payer: Self-pay

## 2011-06-17 LAB — URINALYSIS, ROUTINE W REFLEX MICROSCOPIC
Bilirubin Urine: NEGATIVE
Glucose, UA: NEGATIVE mg/dL
Ketones, ur: NEGATIVE mg/dL
Leukocytes, UA: NEGATIVE
Nitrite: NEGATIVE
Protein, ur: NEGATIVE mg/dL
Specific Gravity, Urine: 1.02 (ref 1.005–1.030)
Urobilinogen, UA: 0.2 mg/dL (ref 0.0–1.0)
pH: 6 (ref 5.0–8.0)

## 2011-06-17 LAB — URINE MICROSCOPIC-ADD ON

## 2011-06-17 LAB — CBC
HCT: 35.1 % — ABNORMAL LOW (ref 36.0–46.0)
Hemoglobin: 11.4 g/dL — ABNORMAL LOW (ref 12.0–15.0)
MCH: 30.6 pg (ref 26.0–34.0)
MCHC: 32.5 g/dL (ref 30.0–36.0)
MCV: 94.1 fL (ref 78.0–100.0)
Platelets: 195 10*3/uL (ref 150–400)
RBC: 3.73 MIL/uL — ABNORMAL LOW (ref 3.87–5.11)
RDW: 13.1 % (ref 11.5–15.5)
WBC: 7.7 10*3/uL (ref 4.0–10.5)

## 2011-06-17 LAB — SURGICAL PCR SCREEN
MRSA, PCR: POSITIVE — AB
Staphylococcus aureus: POSITIVE — AB

## 2011-06-17 NOTE — Patient Instructions (Addendum)
   Your procedure is scheduled on: Friday May 31st  Enter through the Main Entrance of [facility] at: 9:30am Pick up the phone at the desk and dial [phone] and inform us of your arrival.  Please call this number if you have any problems the morning of surgery: [phone]  Remember: Do not eat food after midnight: Thursday Do not drink clear liquids after: midnight Thursday Take these medicines the morning of surgery with a SIP OF WATER: none  Do not wear jewelry, make-up, or FINGER nail polish Do not wear lotions, powders, perfumes or deodorant. Do not shave 48 hours prior to surgery. Do not bring valuables to the hospital. Contacts, dentures or bridgework may not be worn into surgery.  Leave suitcase in the car. After Surgery it may be brought to your room. For patients being admitted to the hospital, checkout time is 11:00am the day of discharge.  .     Remember to use your hibiclens as instructed.Please shower with 1/2 bottle the evening before your surgery and the other 1/2 bottle the morning of surgery. Neck down avoiding private area.

## 2011-06-18 LAB — RPR: RPR Ser Ql: NONREACTIVE

## 2011-06-19 NOTE — H&P (Signed)
[name], [name]           ACCOUNT NO.:  [id number]  MEDICAL RECORD NO.:  [id number]  LOCATION:                                 FACILITY:  PHYSICIAN:  [name], M.D.DATE OF BIRTH:  August 19, 1985  DATE OF ADMISSION: DATE OF DISCHARGE:                             HISTORY & PHYSICAL   HISTORY OF PRESENT ILLNESS:  The patient is a 25 year old female, gravida 2, para 1-0-0-1, who presents at 40 weeks and 4 days gestation (EDC is Jun 22, 2011).  The patient has been followed at the [facility].  Her pregnancy has been complicated by the fact that she has had a prior cesarean section for failure to progress in labor.  She also has a history of migraine headaches and kidney stones.  She has done well during this pregnancy.  OBSTETRICAL HISTORY:  In 2011, the patient had a cesarean section at term, where she delivered an 8 pounds 6 ounce female infant.  Her cesarean section was for failure to progress in labor.  DRUG ALLERGIES:  No known drug allergies.  PAST MEDICAL HISTORY:  The patient has history of migraine headaches as mentioned above.  She has a history of kidney stones.  The patient had her wisdom teeth removed in 2008.  SOCIAL HISTORY:  The patient denies cigarette use, alcohol use, and recreational drug use.  REVIEW OF SYSTEMS:  See history of present illness.  She has normal pregnancy complaints.  FAMILY HISTORY:  Noncontributory.  PHYSICAL EXAMINATION:  VITAL SIGNS:  Her height is 5 feet 3 inches tall. Her weight is 186 pounds.  HEENT:  Within normal limits.  CHEST:  Clear.  HEART:  Regular rate and rhythm.  ABDOMEN:  Gravid.  BREASTS:  Without masses.  EXTREMITIES:  Grossly normal.  NEUROLOGIC:  Grossly normal.  PELVIC:  Cervix was fingertip.  LABORATORY VALUES:  Blood type is O positive.  Antibody screen negative. Sickle cell negative.  VDRL nonreactive.  Rubella  immune.  HBsAg negative.  HIV nonreactive.  Third trimester beta strep is negative.  ASSESSMENT: 1. 40 weeks and 4 days gestation. 2. Prior cesarean section, and desires repeat cesarean section if she     does not labor prior to that date.  PLAN:  The patient will undergo a repeat low-transverse cesarean section.  She understands the indications for her surgical procedure, and she accepts the risks of, but not limited to, anesthetic complications, bleeding, infection, and possible damage to the surrounding organs.     [name], M.D.     AVS/MEDQ  D:  06/18/2011  T:  06/19/2011  Job:  [id number]

## 2011-06-24 ENCOUNTER — Encounter (HOSPITAL_COMMUNITY): Payer: Self-pay | Admitting: *Deleted

## 2011-06-24 ENCOUNTER — Inpatient Hospital Stay (HOSPITAL_COMMUNITY)
Admission: AD | Admit: 2011-06-24 | Discharge: 2011-07-04 | DRG: 765 | Disposition: A | Discharge status: Home / Self Care | Payer: Medicaid Other | Source: Ambulatory Visit | Attending: Obstetrics and Gynecology | Admitting: Obstetrics and Gynecology

## 2011-06-24 ENCOUNTER — Encounter (HOSPITAL_COMMUNITY): Payer: Self-pay | Admitting: Anesthesiology

## 2011-06-24 ENCOUNTER — Encounter (HOSPITAL_COMMUNITY)
Admission: AD | Disposition: A | Discharge status: Home / Self Care | Payer: Self-pay | Source: Ambulatory Visit | Attending: Obstetrics and Gynecology

## 2011-06-24 ENCOUNTER — Inpatient Hospital Stay (HOSPITAL_COMMUNITY): Payer: Medicaid Other | Admitting: Anesthesiology

## 2011-06-24 DIAGNOSIS — Z01818 Encounter for other preprocedural examination: Secondary | ICD-10-CM

## 2011-06-24 DIAGNOSIS — O411090 Infection of amniotic sac and membranes, unspecified, unspecified trimester, not applicable or unspecified: Secondary | ICD-10-CM | POA: Diagnosis present

## 2011-06-24 DIAGNOSIS — IMO0002 Reserved for concepts with insufficient information to code with codable children: Secondary | ICD-10-CM | POA: Diagnosis not present

## 2011-06-24 DIAGNOSIS — O165 Unspecified maternal hypertension, complicating the puerperium: Secondary | ICD-10-CM | POA: Diagnosis not present

## 2011-06-24 DIAGNOSIS — D649 Anemia, unspecified: Secondary | ICD-10-CM | POA: Diagnosis not present

## 2011-06-24 DIAGNOSIS — K9189 Other postprocedural complications and disorders of digestive system: Secondary | ICD-10-CM | POA: Diagnosis not present

## 2011-06-24 DIAGNOSIS — K560 Paralytic ileus: Secondary | ICD-10-CM | POA: Diagnosis not present

## 2011-06-24 DIAGNOSIS — O34219 Maternal care for unspecified type scar from previous cesarean delivery: Secondary | ICD-10-CM

## 2011-06-24 DIAGNOSIS — O9903 Anemia complicating the puerperium: Secondary | ICD-10-CM | POA: Diagnosis not present

## 2011-06-24 DIAGNOSIS — K567 Ileus, unspecified: Secondary | ICD-10-CM | POA: Diagnosis not present

## 2011-06-24 DIAGNOSIS — Z01812 Encounter for preprocedural laboratory examination: Secondary | ICD-10-CM

## 2011-06-24 DIAGNOSIS — Z98891 History of uterine scar from previous surgery: Secondary | ICD-10-CM | POA: Diagnosis not present

## 2011-06-24 LAB — URINE MICROSCOPIC-ADD ON

## 2011-06-24 LAB — CBC
HCT: 36 % (ref 36.0–46.0)
HCT: 33.8 % — ABNORMAL LOW (ref 36.0–46.0)
Hemoglobin: 11.9 g/dL — ABNORMAL LOW (ref 12.0–15.0)
Hemoglobin: 11.3 g/dL — ABNORMAL LOW (ref 12.0–15.0)
MCH: 31.2 pg (ref 26.0–34.0)
MCH: 31.6 pg (ref 26.0–34.0)
MCHC: 33.1 g/dL (ref 30.0–36.0)
MCHC: 33.4 g/dL (ref 30.0–36.0)
MCV: 94.2 fL (ref 78.0–100.0)
MCV: 94.4 fL (ref 78.0–100.0)
Platelets: 196 10*3/uL (ref 150–400)
Platelets: 189 10*3/uL (ref 150–400)
RBC: 3.82 MIL/uL — ABNORMAL LOW (ref 3.87–5.11)
RBC: 3.58 MIL/uL — ABNORMAL LOW (ref 3.87–5.11)
RDW: 13.3 % (ref 11.5–15.5)
RDW: 13.1 % (ref 11.5–15.5)
WBC: 9.6 10*3/uL (ref 4.0–10.5)
WBC: 14 10*3/uL — ABNORMAL HIGH (ref 4.0–10.5)

## 2011-06-24 LAB — URINALYSIS, ROUTINE W REFLEX MICROSCOPIC
Bilirubin Urine: NEGATIVE
Glucose, UA: NEGATIVE mg/dL
Ketones, ur: NEGATIVE mg/dL
Nitrite: NEGATIVE
Protein, ur: NEGATIVE mg/dL
Specific Gravity, Urine: 1.01 (ref 1.005–1.030)
Urobilinogen, UA: 0.2 mg/dL (ref 0.0–1.0)
pH: 8 (ref 5.0–8.0)

## 2011-06-24 SURGERY — Surgical Case
Anesthesia: Spinal | Site: Abdomen | Wound class: Clean Contaminated

## 2011-06-24 MED ORDER — FERROUS SULFATE 325 (65 FE) MG PO TABS
325.0000 mg | ORAL_TABLET | Freq: Two times a day (BID) | ORAL | Status: DC
  Administered 2011-06-25: 325 mg via ORAL
  Filled 2011-06-24: qty 1

## 2011-06-24 MED ORDER — ONDANSETRON HCL 4 MG/2ML IJ SOLN: 4.0000 mg | INTRAMUSCULAR | Status: DC | PRN

## 2011-06-24 MED ORDER — ONDANSETRON HCL 4 MG/2ML IJ SOLN
INTRAMUSCULAR | Status: AC
  Filled 2011-06-24: qty 2

## 2011-06-24 MED ORDER — MUPIROCIN CALCIUM 2 % NA OINT
TOPICAL_OINTMENT | Freq: Two times a day (BID) | NASAL | Status: DC
  Administered 2011-06-24 – 2011-06-25 (×3): via NASAL

## 2011-06-24 MED ORDER — OXYTOCIN 20 UNITS IN LACTATED RINGERS INFUSION - SIMPLE: 125.0000 mL/h | INTRAVENOUS | Status: DC

## 2011-06-24 MED ORDER — KETOROLAC TROMETHAMINE 30 MG/ML IJ SOLN: 30.0000 mg | Freq: Four times a day (QID) | INTRAMUSCULAR | Status: DC | PRN

## 2011-06-24 MED ORDER — CEFAZOLIN SODIUM-DEXTROSE 2-3 GM-% IV SOLR: 2.0000 g | INTRAVENOUS | Status: DC

## 2011-06-24 MED ORDER — ZOLPIDEM TARTRATE 5 MG PO TABS: 5.0000 mg | ORAL_TABLET | Freq: Every evening | ORAL | Status: DC | PRN

## 2011-06-24 MED ORDER — MEPERIDINE HCL 25 MG/ML IJ SOLN: 6.2500 mg | INTRAMUSCULAR | Status: DC | PRN

## 2011-06-24 MED ORDER — METHYLERGONOVINE MALEATE 0.2 MG/ML IJ SOLN: 0.2000 mg | INTRAMUSCULAR | Status: DC | PRN

## 2011-06-24 MED ORDER — SODIUM CHLORIDE 0.9 % IJ SOLN
INTRAMUSCULAR | Status: AC
  Filled 2011-06-24: qty 3

## 2011-06-24 MED ORDER — FAMOTIDINE IN NACL 20-0.9 MG/50ML-% IV SOLN
20.0000 mg | Freq: Once | INTRAVENOUS | Status: AC
  Administered 2011-06-24: 20 mg via INTRAVENOUS
  Filled 2011-06-24: qty 50

## 2011-06-24 MED ORDER — OXYTOCIN 10 UNIT/ML IJ SOLN
INTRAMUSCULAR | Status: AC
  Filled 2011-06-24: qty 2

## 2011-06-24 MED ORDER — PROMETHAZINE HCL 25 MG/ML IJ SOLN: 6.2500 mg | INTRAMUSCULAR | Status: DC | PRN

## 2011-06-24 MED ORDER — BUTORPHANOL TARTRATE 2 MG/ML IJ SOLN
2.0000 mg | Freq: Once | INTRAMUSCULAR | Status: AC
  Administered 2011-06-24: 2 mg via INTRAVENOUS
  Filled 2011-06-24: qty 1

## 2011-06-24 MED ORDER — OXYCODONE-ACETAMINOPHEN 5-325 MG PO TABS
1.0000 | ORAL_TABLET | ORAL | Status: DC | PRN
  Administered 2011-06-24 – 2011-06-25 (×4): 1 via ORAL
  Administered 2011-06-25: 2 via ORAL
  Filled 2011-06-24: qty 1
  Filled 2011-06-24: qty 2
  Filled 2011-06-24 (×4): qty 1

## 2011-06-24 MED ORDER — PHENYLEPHRINE 40 MCG/ML (10ML) SYRINGE FOR IV PUSH (FOR BLOOD PRESSURE SUPPORT)
PREFILLED_SYRINGE | INTRAVENOUS | Status: AC
  Filled 2011-06-24: qty 5

## 2011-06-24 MED ORDER — MEASLES, MUMPS & RUBELLA VAC ~~LOC~~ INJ
0.5000 mL | INJECTION | Freq: Once | SUBCUTANEOUS | Status: DC
  Filled 2011-06-24: qty 0.5

## 2011-06-24 MED ORDER — SIMETHICONE 80 MG PO CHEW: 80.0000 mg | CHEWABLE_TABLET | ORAL | Status: DC | PRN

## 2011-06-24 MED ORDER — BUPIVACAINE IN DEXTROSE 0.75-8.25 % IT SOLN
INTRATHECAL | Status: DC | PRN
  Administered 2011-06-24: 1.4 mL via INTRATHECAL

## 2011-06-24 MED ORDER — METOCLOPRAMIDE HCL 5 MG/ML IJ SOLN: 10.0000 mg | Freq: Three times a day (TID) | INTRAMUSCULAR | Status: DC | PRN

## 2011-06-24 MED ORDER — KETOROLAC TROMETHAMINE 60 MG/2ML IM SOLN
INTRAMUSCULAR | Status: AC
  Administered 2011-06-24: 60 mg via INTRAMUSCULAR
  Filled 2011-06-24: qty 2

## 2011-06-24 MED ORDER — ONDANSETRON HCL 4 MG PO TABS: 4.0000 mg | ORAL_TABLET | ORAL | Status: DC | PRN

## 2011-06-24 MED ORDER — BUPIVACAINE HCL (PF) 0.25 % IJ SOLN
INTRAMUSCULAR | Status: AC
  Filled 2011-06-24: qty 30

## 2011-06-24 MED ORDER — NALOXONE HCL 0.4 MG/ML IJ SOLN: 0.4000 mg | INTRAMUSCULAR | Status: DC | PRN

## 2011-06-24 MED ORDER — METHYLERGONOVINE MALEATE 0.2 MG PO TABS: 0.2000 mg | ORAL_TABLET | ORAL | Status: DC | PRN

## 2011-06-24 MED ORDER — WITCH HAZEL-GLYCERIN EX PADS: 1.0000 "application " | MEDICATED_PAD | CUTANEOUS | Status: DC | PRN

## 2011-06-24 MED ORDER — LACTATED RINGERS IV SOLN
INTRAVENOUS | Status: DC | PRN
  Administered 2011-06-24 (×3): via INTRAVENOUS

## 2011-06-24 MED ORDER — DIPHENHYDRAMINE HCL 50 MG/ML IJ SOLN: 12.5000 mg | INTRAMUSCULAR | Status: DC | PRN

## 2011-06-24 MED ORDER — BUTORPHANOL TARTRATE 2 MG/ML IJ SOLN
1.0000 mg | Freq: Once | INTRAMUSCULAR | Status: AC
  Administered 2011-06-24: 1 mg via INTRAVENOUS
  Filled 2011-06-24: qty 1

## 2011-06-24 MED ORDER — MORPHINE SULFATE 0.5 MG/ML IJ SOLN
INTRAMUSCULAR | Status: AC
  Filled 2011-06-24: qty 10

## 2011-06-24 MED ORDER — NALBUPHINE HCL 10 MG/ML IJ SOLN
5.0000 mg | INTRAMUSCULAR | Status: DC | PRN
  Administered 2011-06-24: 10 mg via INTRAVENOUS
  Filled 2011-06-24 (×2): qty 1

## 2011-06-24 MED ORDER — PRENATAL MULTIVITAMIN CH
1.0000 | ORAL_TABLET | Freq: Every day | ORAL | Status: DC
  Filled 2011-06-24: qty 1

## 2011-06-24 MED ORDER — SCOPOLAMINE 1 MG/3DAYS TD PT72
1.0000 | MEDICATED_PATCH | Freq: Once | TRANSDERMAL | Status: DC
  Administered 2011-06-24: 1.5 mg via TRANSDERMAL

## 2011-06-24 MED ORDER — TETANUS-DIPHTH-ACELL PERTUSSIS 5-2.5-18.5 LF-MCG/0.5 IM SUSP: 0.5000 mL | Freq: Once | INTRAMUSCULAR | Status: DC

## 2011-06-24 MED ORDER — LANOLIN HYDROUS EX OINT: 1.0000 "application " | TOPICAL_OINTMENT | CUTANEOUS | Status: DC | PRN

## 2011-06-24 MED ORDER — SIMETHICONE 80 MG PO CHEW
80.0000 mg | CHEWABLE_TABLET | Freq: Three times a day (TID) | ORAL | Status: DC
  Administered 2011-06-24 – 2011-06-25 (×3): 80 mg via ORAL

## 2011-06-24 MED ORDER — MENTHOL 3 MG MT LOZG: 1.0000 | LOZENGE | OROMUCOSAL | Status: DC | PRN

## 2011-06-24 MED ORDER — KETOROLAC TROMETHAMINE 30 MG/ML IJ SOLN: 15.0000 mg | Freq: Once | INTRAMUSCULAR | Status: DC | PRN

## 2011-06-24 MED ORDER — DIPHENHYDRAMINE HCL 50 MG/ML IJ SOLN: 25.0000 mg | INTRAMUSCULAR | Status: DC | PRN

## 2011-06-24 MED ORDER — MEPERIDINE HCL 25 MG/ML IJ SOLN
INTRAMUSCULAR | Status: AC
  Filled 2011-06-24: qty 1

## 2011-06-24 MED ORDER — IBUPROFEN 600 MG PO TABS
600.0000 mg | ORAL_TABLET | Freq: Four times a day (QID) | ORAL | Status: DC
  Administered 2011-06-25 (×3): 600 mg via ORAL
  Filled 2011-06-24 (×3): qty 1

## 2011-06-24 MED ORDER — FENTANYL CITRATE 0.05 MG/ML IJ SOLN
INTRAMUSCULAR | Status: AC
  Filled 2011-06-24: qty 2

## 2011-06-24 MED ORDER — BUPIVACAINE HCL (PF) 0.25 % IJ SOLN
INTRAMUSCULAR | Status: DC | PRN
  Administered 2011-06-24: 30 mL

## 2011-06-24 MED ORDER — MEPERIDINE HCL 25 MG/ML IJ SOLN
INTRAMUSCULAR | Status: DC | PRN
  Administered 2011-06-24 (×2): 12.5 mg via INTRAVENOUS

## 2011-06-24 MED ORDER — DIBUCAINE 1 % RE OINT: 1.0000 "application " | TOPICAL_OINTMENT | RECTAL | Status: DC | PRN

## 2011-06-24 MED ORDER — HYDROMORPHONE HCL PF 1 MG/ML IJ SOLN: 0.2500 mg | INTRAMUSCULAR | Status: DC | PRN

## 2011-06-24 MED ORDER — LACTATED RINGERS IV BOLUS (SEPSIS)
1000.0000 mL | Freq: Once | INTRAVENOUS | Status: AC
  Administered 2011-06-24: 1000 mL via INTRAVENOUS

## 2011-06-24 MED ORDER — FENTANYL CITRATE 0.05 MG/ML IJ SOLN
INTRAMUSCULAR | Status: DC | PRN
  Administered 2011-06-24: 12.5 ug via INTRATHECAL

## 2011-06-24 MED ORDER — ONDANSETRON HCL 4 MG/2ML IJ SOLN
INTRAMUSCULAR | Status: DC | PRN
  Administered 2011-06-24: 4 mg via INTRAVENOUS

## 2011-06-24 MED ORDER — SENNOSIDES-DOCUSATE SODIUM 8.6-50 MG PO TABS
2.0000 | ORAL_TABLET | Freq: Every day | ORAL | Status: DC
  Administered 2011-06-24: 2 via ORAL

## 2011-06-24 MED ORDER — NALBUPHINE HCL 10 MG/ML IJ SOLN
5.0000 mg | INTRAMUSCULAR | Status: DC | PRN
  Filled 2011-06-24: qty 1

## 2011-06-24 MED ORDER — OXYTOCIN 20 UNITS IN LACTATED RINGERS INFUSION - SIMPLE
INTRAVENOUS | Status: AC
  Administered 2011-06-24: 20 [IU]
  Filled 2011-06-24: qty 1000

## 2011-06-24 MED ORDER — SODIUM CHLORIDE 0.9 % IJ SOLN: 3.0000 mL | INTRAMUSCULAR | Status: DC | PRN

## 2011-06-24 MED ORDER — KETOROLAC TROMETHAMINE 60 MG/2ML IM SOLN
60.0000 mg | Freq: Once | INTRAMUSCULAR | Status: AC | PRN
  Administered 2011-06-24: 60 mg via INTRAMUSCULAR

## 2011-06-24 MED ORDER — SODIUM CHLORIDE 0.9 % IV SOLN
1.0000 ug/kg/h | INTRAVENOUS | Status: DC | PRN
  Filled 2011-06-24: qty 2.5

## 2011-06-24 MED ORDER — KETOROLAC TROMETHAMINE 30 MG/ML IJ SOLN
30.0000 mg | Freq: Four times a day (QID) | INTRAMUSCULAR | Status: DC | PRN
  Administered 2011-06-24: 30 mg via INTRAVENOUS
  Filled 2011-06-24: qty 1

## 2011-06-24 MED ORDER — CEFAZOLIN SODIUM-DEXTROSE 2-3 GM-% IV SOLR
2.0000 g | Freq: Once | INTRAVENOUS | Status: AC
  Administered 2011-06-24: 2 g via INTRAVENOUS
  Filled 2011-06-24: qty 50

## 2011-06-24 MED ORDER — DIPHENHYDRAMINE HCL 25 MG PO CAPS: 25.0000 mg | ORAL_CAPSULE | Freq: Four times a day (QID) | ORAL | Status: DC | PRN

## 2011-06-24 MED ORDER — ONDANSETRON HCL 4 MG/2ML IJ SOLN: 4.0000 mg | Freq: Three times a day (TID) | INTRAMUSCULAR | Status: DC | PRN

## 2011-06-24 MED ORDER — SCOPOLAMINE 1 MG/3DAYS TD PT72
MEDICATED_PATCH | TRANSDERMAL | Status: AC
  Administered 2011-06-24: 1.5 mg via TRANSDERMAL
  Filled 2011-06-24: qty 1

## 2011-06-24 MED ORDER — DIPHENHYDRAMINE HCL 25 MG PO CAPS
25.0000 mg | ORAL_CAPSULE | ORAL | Status: DC | PRN
  Administered 2011-06-24: 25 mg via ORAL
  Filled 2011-06-24: qty 1

## 2011-06-24 MED ORDER — MORPHINE SULFATE (PF) 0.5 MG/ML IJ SOLN
INTRAMUSCULAR | Status: DC | PRN
  Administered 2011-06-24: .1 mg via INTRATHECAL

## 2011-06-24 MED ORDER — CITRIC ACID-SODIUM CITRATE 334-500 MG/5ML PO SOLN
30.0000 mL | Freq: Once | ORAL | Status: AC
  Administered 2011-06-24: 30 mL via ORAL
  Filled 2011-06-24: qty 15

## 2011-06-24 MED ORDER — PHENYLEPHRINE HCL 10 MG/ML IJ SOLN
INTRAMUSCULAR | Status: DC | PRN
  Administered 2011-06-24 (×2): 80 ug via INTRAVENOUS
  Administered 2011-06-24: 40 ug via INTRAVENOUS

## 2011-06-24 SURGICAL SUPPLY — 38 items
APL SKNCLS STERI-STRIP NONHPOA (GAUZE/BANDAGES/DRESSINGS) ×1
BENZOIN TINCTURE PRP APPL 2/3 (GAUZE/BANDAGES/DRESSINGS) ×2 IMPLANT
BOOTIES KNEE HIGH SLOAN (MISCELLANEOUS) ×4 IMPLANT
CHLORAPREP W/TINT 26ML (MISCELLANEOUS) ×2 IMPLANT
CLOTH BEACON ORANGE TIMEOUT ST (SAFETY) ×2 IMPLANT
DRAIN JACKSON PRT FLT 10 (DRAIN) IMPLANT
DRESSING TELFA 8X3 (GAUZE/BANDAGES/DRESSINGS) ×2 IMPLANT
ELECT REM PT RETURN 9FT ADLT (ELECTROSURGICAL) ×2
ELECTRODE REM PT RTRN 9FT ADLT (ELECTROSURGICAL) ×1 IMPLANT
EVACUATOR SILICONE 100CC (DRAIN) IMPLANT
EXTRACTOR VACUUM M CUP 4 TUBE (SUCTIONS) IMPLANT
GAUZE SPONGE 4X4 12PLY STRL LF (GAUZE/BANDAGES/DRESSINGS) ×4 IMPLANT
GLOVE BIOGEL PI IND STRL 7.0 (GLOVE) ×2 IMPLANT
GLOVE BIOGEL PI INDICATOR 7.0 (GLOVE) ×2
GLOVE ECLIPSE 6.5 STRL STRAW (GLOVE) ×2 IMPLANT
GOWN PREVENTION PLUS LG XLONG (DISPOSABLE) ×6 IMPLANT
KIT ABG SYR 3ML LUER SLIP (SYRINGE) IMPLANT
NDL HYPO 25X5/8 SAFETYGLIDE (NEEDLE) IMPLANT
NEEDLE HYPO 22GX1.5 SAFETY (NEEDLE) ×2 IMPLANT
NEEDLE HYPO 25X5/8 SAFETYGLIDE (NEEDLE) IMPLANT
NS IRRIG 1000ML POUR BTL (IV SOLUTION) ×4 IMPLANT
PACK C SECTION WH (CUSTOM PROCEDURE TRAY) ×2 IMPLANT
PAD ABD 7.5X8 STRL (GAUZE/BANDAGES/DRESSINGS) ×2 IMPLANT
RTRCTR C-SECT PINK 25CM LRG (MISCELLANEOUS) IMPLANT
SLEEVE SCD COMPRESS KNEE MED (MISCELLANEOUS) IMPLANT
SPONGE GAUZE 4X4 12PLY (GAUZE/BANDAGES/DRESSINGS) ×1 IMPLANT
STRIP CLOSURE SKIN 1/2X4 (GAUZE/BANDAGES/DRESSINGS) ×2 IMPLANT
SUT CHROMIC GUT AB #0 18 (SUTURE) IMPLANT
SUT MNCRL AB 3-0 PS2 27 (SUTURE) ×2 IMPLANT
SUT SILK 2 0 FSL 18 (SUTURE) IMPLANT
SUT VIC AB 0 CTX 36 (SUTURE) ×4
SUT VIC AB 0 CTX36XBRD ANBCTRL (SUTURE) ×2 IMPLANT
SUT VIC AB 1 CT1 36 (SUTURE) ×4 IMPLANT
SYR 20CC LL (SYRINGE) ×2 IMPLANT
TAPE CLOTH SURG 4X10 WHT LF (GAUZE/BANDAGES/DRESSINGS) ×1 IMPLANT
TOWEL OR 17X24 6PK STRL BLUE (TOWEL DISPOSABLE) ×4 IMPLANT
TRAY FOLEY CATH 14FR (SET/KITS/TRAYS/PACK) ×2 IMPLANT
WATER STERILE IRR 1000ML POUR (IV SOLUTION) ×2 IMPLANT

## 2011-06-24 NOTE — Anesthesia Preprocedure Evaluation (Signed)
Anesthesia Evaluation  Patient identified by MRN, date of birth, ID band Patient awake    Reviewed: Allergy & Precautions, H&P , NPO status , Patient's Chart, lab work & pertinent test results  Airway Mallampati: II TM Distance: >3 FB Neck ROM: full    Dental No notable dental hx.    Pulmonary neg pulmonary ROS,  breath sounds clear to auscultation  Pulmonary exam normal       Cardiovascular negative cardio ROS      Neuro/Psych    GI/Hepatic negative GI ROS, Neg liver ROS,   Endo/Other  negative endocrine ROS  Renal/GU negative Renal ROS  negative genitourinary   Musculoskeletal   Abdominal Normal abdominal exam  (+)   Peds negative pediatric ROS (+)  Hematology negative hematology ROS (+)   Anesthesia Other Findings   Reproductive/Obstetrics (+) Pregnancy                           Anesthesia Physical Anesthesia Plan  ASA: II and Emergent  Anesthesia Plan: Spinal   Post-op Pain Management:    Induction:   Airway Management Planned:   Additional Equipment:   Intra-op Plan:   Post-operative Plan:   Informed Consent: I have reviewed the patients History and Physical, chart, labs and discussed the procedure including the risks, benefits and alternatives for the proposed anesthesia with the patient or authorized representative who has indicated his/her understanding and acceptance.     Plan Discussed with: CRNA and Surgeon  Anesthesia Plan Comments:         Anesthesia Quick Evaluation

## 2011-06-24 NOTE — Anesthesia Procedure Notes (Signed)
Spinal  Patient location during procedure: OR Start time: 06/24/2011 8:37 AM End time: 06/24/2011 8:41 AM Staffing Anesthesiologist: Sandrea Hughs Performed by: anesthesiologist  Preanesthetic Checklist Completed: patient identified, site marked, surgical consent, pre-op evaluation, timeout performed, IV checked, risks and benefits discussed and monitors and equipment checked Spinal Block Patient position: sitting Prep: DuraPrep Patient monitoring: heart rate, cardiac monitor, continuous pulse ox and blood pressure Approach: midline Location: L3-4 Injection technique: single-shot Needle Needle type: Sprotte  Needle gauge: 24 G Needle length: 9 cm Needle insertion depth: 7 cm Assessment Sensory level: T4

## 2011-06-24 NOTE — H&P (Signed)
H&P addendum See H&Pdictated by Dr. Stefano Gaul for repeat cesarean section. Pt presented with c/o of uterine contractions all night, medicated at 0430 and again before 0800 for complaint of painful contractions. Plan to proceed with repeat cesarean section, collaboration with Dr. Estanislado Pandy per telephone. Lavera Guise, CNM

## 2011-06-24 NOTE — OR Nursing (Signed)
Called for pt 0801 surgeon has not seen pt no consent signed no update in chart to H/P.Called surgeon  and discussed with her what we needed from her and that we were ready when she was

## 2011-06-24 NOTE — Progress Notes (Signed)
Pt did not have any continuing fluid orders after second bag of pitocin. Called the nurse midwife on call to receive orders. The nurse midwife has not called back by 1930. Lactated Ringers were given to pt to keep fluids running until further orders received.

## 2011-06-24 NOTE — H&P (Signed)
[name] is a 26 y.o.black female presenting at 40.2 weeks with CC of ctxs since 2130 last night that she reports as being "5-7" min apart.  Pt called around 2245 to report ctxs, and was instructed to come for eval then, but didn't come in until around 0400 this morning, stating, she tried to "sleep it off," but has been up and down throughout the night with them waking her up.  She also stated her continued desire to have repeat c/s this Friday 06/26/11 with Dr. Stefano Gaul, as well as it being more convenient that it is preceding the weekend and her family will be able to help more easily b/c of her hospitalization spanning the weekend vs now.  On arrival, pt was asking if any "medications," could stop contractions.    She denies LOF or VB.  No UTI or PIH s/s.  No recent illness or fever.  No recent u/s at office.  Initially consulted w/ Dr. Normand Sloop around 0430 r/e pt's desires and requests, and plan made to try IVF and Stadol and observe for labor progression.  Pt was offered c/s at time of arrival, but she continued to decline.  Pt was able to rest some in between ctxs after Stadol at 0431, but would still awake during ctxs w/ discomfort.  Re-evaluated pt around 0645, and ctxs had since increased in intensity and frequency, despite interventions, and c/s rec'd and pt now agreeable to proceed with repeat this morning.    Maternal Medical History:  Reason for admission: Reason for admission: contractions.  Contractions: Onset was 6-12 hours ago.   Frequency: irregular.   Perceived severity is moderate.   Ctx onset at 2130 06/23/11  Fetal activity: Perceived fetal activity is normal.   Last perceived fetal movement was within the past hour.    Prenatal complications: 1.  Prev c/s for FTP (8+6lb) and desires repeat this pregnancy 2.  H/o abnl pap 3.  H/o ov cysts 4.  Migraines 5.  H/o kidney stones        OB History    Grav Para Term Preterm Abortions TAB SAB Ect Mult Living   2 1 1        1      Past Medical History  Diagnosis Date  . Kidney stones   . Panic attack   . Kidney stones   . Ovarian cyst   . Headache   . Migraine   . History of renal stone   . BV (bacterial vaginosis)   . History of kidney stones   . Cyst   . Abnormal Pap smear    Past Surgical History  Procedure Date  . Cesarean section 03/2009  . Wisdom tooth extraction 2008, 02/2010   Family History: family history includes Alzheimer's disease in her maternal grandfather; Anemia in her mother; Asperger's syndrome in her brother; Bipolar disorder in her maternal grandfather; Diabetes in her father; Heart attack in her maternal grandmother; Heart disease in her father and maternal grandmother; Heart murmur in her maternal grandfather; Hypertension in her father and mother; Kidney disease in her father; Lupus in her maternal aunt and paternal aunt; Mental illness in her maternal grandfather; Sickle cell trait in her sister; Thrombophlebitis in her maternal grandmother and mother; and Transient ischemic attack in her maternal grandmother.  There is no history of Hypotension, and Anesthesia problems, and Malignant hyperthermia, and Pseudochol deficiency, . Social History:  reports that she has never smoked. She has never used smokeless tobacco. She reports that she  does not drink alcohol. Her drug history not on file.  Review of Systems  Constitutional: Negative.   HENT: Negative.   Eyes: Negative.   Respiratory: Negative.   Cardiovascular: Negative.   Gastrointestinal: Positive for diarrhea.       Loose stools last 2 days  Genitourinary: Negative.   Skin: Negative.   Neurological: Negative.     Dilation: Closed Effacement (%): Thick Station: -3 Exam by:: Eustace Pen CNM  Maternal Exam:  Uterine Assessment: Contraction strength is moderate.  Contraction frequency is regular.  On arrival to [facility], ctxs initially every 7-8 min, since observation, IVF, and 2mg  IV Stadol, now q 4-6 min  Abdomen:  Patient reports no abdominal tenderness. Surgical scars: low transverse.   Estimated fetal weight is 7.5-8.5lbs.   Fetal presentation: vertex  Introitus: Normal vulva. Cervix: Cervix evaluated by digital exam.     Fetal Exam Fetal Monitor Review: Mode: ultrasound.   Baseline rate: 145.  Variability: moderate (6-25 bpm).   Pattern: accelerations present and no decelerations.    Fetal State Assessment: Category I - tracings are normal.    .. Results for orders placed during the hospital encounter of 06/24/11 (from the past 24 hour(s))  CBC     Status: Abnormal   Collection Time   06/24/11  7:20 AM      Component Value Range   WBC 9.6  4.0 - 10.5 (K/uL)   RBC 3.82 (*) 3.87 - 5.11 (MIL/uL)   Hemoglobin 11.9 (*) 12.0 - 15.0 (g/dL)   HCT 40.9  81.1 - 91.4 (%)   MCV 94.2  78.0 - 100.0 (fL)   MCH 31.2  26.0 - 34.0 (pg)   MCHC 33.1  30.0 - 36.0 (g/dL)   RDW 78.2  95.6 - 21.3 (%)   Platelets 196  150 - 400 (K/uL)   Filed Vitals:   06/24/11 0334 06/24/11 0633  BP: 119/64   Pulse: 74   Temp: 98.8 F (37.1 C) 97.5 F (36.4 C)  TempSrc: Oral Oral  Resp: 20   Height: 5\' 3"  (1.6 m)   Weight: 187 lb 8 oz (85.049 kg)   SpO2: 100%    Physical Exam  Constitutional: She is oriented to person, place, and time. She appears well-developed and well-nourished. No distress.       Does squirm in bed now w/ ctxs, where on arrival was not  HENT:  Head: Normocephalic and atraumatic.  Eyes: Pupils are equal, round, and reactive to light.  Cardiovascular: Normal rate.   Respiratory: Effort normal.  GI: Soft.       gravid  Genitourinary:       Cx: closed/long/-2  Musculoskeletal: She exhibits no edema.  Neurological: She is alert and oriented to person, place, and time. She has normal reflexes.  Skin: Skin is warm and dry.  Psychiatric: She has a normal mood and affect. Her behavior is normal. Judgment and thought content normal.    Prenatal labs: ABO, Rh: O/Positive/-- (10/11  0000) Antibody: Negative (10/11 0000) Rubella: Immune (10/11 0000) RPR: NON REACTIVE (05/22 1059)  HBsAg: Negative (10/11 0000)  HIV: Non-reactive (10/11 0000)  GBS: Negative (04/25 0000)   Assessment/Plan: 1.  40.2 weeks 2.  Early labor 3.  Previous c/s w/ desire for repeat 4.  Preop MRSA & staph screens both positive (3d of antibiotic trx only)  1.  Admit to [facility] w/ routine preop orders w/ Dr. Estanislado Pandy as attending 2.  Dr. Stefano Gaul has consented pt for repeat low transverse  c/s and pt verbalizes r/b/a, and desires to proceed w/ repeat for delivery 3.  MD to follow 4.  Please see dr. Debria Garret complete H&P for further details, as this is an addendum with today's pertinent HPI and exam.  Jakari Jacot H 06/24/2011, 7:46 AM

## 2011-06-24 NOTE — Transfer of Care (Signed)
Immediate Anesthesia Transfer of Care Note  Patient: [name]  Procedure(s) Performed: Procedure(s) (LRB): CESAREAN SECTION (N/A)  Patient Location: PACU  Anesthesia Type: Spinal  Level of Consciousness: awake, alert  and oriented  Airway & Oxygen Therapy: Patient Spontanous Breathing  Post-op Assessment: Report given to PACU RN and Post -op Vital signs reviewed and stable  Post vital signs: stable  Complications: No apparent anesthesia complications

## 2011-06-24 NOTE — Addendum Note (Signed)
Addendum  created 06/24/11 1800 by Armanda Heritage, RN   Modules edited:Charges VN, Notes Section

## 2011-06-24 NOTE — Op Note (Signed)
Preoperative diagnosis: Intrauterine pregnancy at 40 weeks and 2 days, previous cesarean delivery, active labor  Post operative diagnosis: Same  Anesthesia: Spinal  Anesthesiologist: Dr. Arby Barrette  Procedure: Repeat low transverse cesarean section  Surgeon: Dr. Dois Davenport Symia Herdt  Assistant: Lavera Guise CNM  Estimated blood loss: 700 cc   Procedure:  After being informed of the planned procedure and possible complications including bleeding, infection, injury to other organs, informed consent is obtained. The patient is taken to OR #1 and given spinal anesthesia without complication. She is placed in the dorsal decubitus position with the pelvis tilted to the left. She is then prepped and draped in a sterile fashion. A Foley catheter is inserted in her bladder.  After assessing adequate level of anesthesia, we infiltrate the suprapubic area with 20 cc of Marcaine 0.25 and perform a Pfannenstiel incision which is brought down sharply to the fascia. The fascia is entered in a low transverse fashion. Linea alba is dissected. Peritoneum is entered in a midline fashion. An Alexis retractor is easily positioned. Visceral peritoneum is entered in a low transverse fashion allowing [place] to safely retract bladder by developing a bladder flap.  The myometrium is then entered in a low transverse fashion; first with knife and then extended bluntly. Amniotic fluid is meconium stained. We assist the birth of a female  infant in vertex presentation. Mouth and nose are suctioned. The baby is delivered. The cord is clamped and sectioned. The baby is given to the neonatologist present in the room.  10 cc of blood is drawn from the umbilical vein.The placenta is allowed to deliver spontaneously. It is complete and the cord has 3 vessels. Uterine revision is negative.  We proceed with closure of the myometrium in 2 layers: First with a running locked suture of 0 Vicryl, then with a Lembert suture of 0 Vicryl  imbricating the first one. Hemostasis is completed with cauterization on peritoneal edges.  Both paracolic gutters are cleaned. Both tubes and ovaries are assessed and normal. The pelvis is profusely irrigated with warm saline to confirm a satisfactory hemostasis.  Retractors and sponges are removed. Under fascia hemostasis is completed with cauterization. The fascia is then closed with 2 running sutures of 0 Vicryl meeting midline. The wound is irrigated with warm saline and hemostasis is completed with cauterization. A # 10 Jackson-Pratt drain is placed in the wound with a right incision held in place with 0 Silk suture.The skin is closed with a subcuticular suture of 3-0 Monocryl and Steri-Strips.  Instrument and sponge count is complete x2. Estimated blood loss is 700 cc.  The procedure is well tolerated by the patient who is taken to recovery room in a well and stable condition.  female baby named [name] was born at 9:06 and received an Apgar of 8  at 1 minute and 9 at 5 minutes.    Specimen: Placenta sent to L & D   Maicy Filip A MD 5/29/201310:13 AM

## 2011-06-24 NOTE — Brief Op Note (Signed)
06/24/2011  10:12 AM  PATIENT:  [name]  25 y.o. female  PRE-OPERATIVE DIAGNOSIS:  previous cesarean in labor at 40+2 weeks  POST-OPERATIVE DIAGNOSIS: same    Procedure(s): CESAREAN SECTION    Surgeon(s): Esmeralda Arthur, MD   ASSISTANTS: Lavera Guise CNM   ANESTHESIA:   spinal  LOCAL MEDICATIONS USED:  MARCAINE     SPECIMEN:  Placenta  DISPOSITION OF SPECIMEN:  L & D  COUNTS:  YES  ESTIMATED BLOOD LOSS: 700 cc  PATIENT DISPOSITION:  PACU - hemodynamically stable.       Maher Shon A MD 06/24/2011 10:12 AM

## 2011-06-24 NOTE — Anesthesia Postprocedure Evaluation (Signed)
  Anesthesia Post-op Note  Patient: [name]  Procedure(s) Performed: Procedure(s) (LRB): CESAREAN SECTION (N/A)  Patient Location: PACU and Mother/Baby  Anesthesia Type: Spinal  Level of Consciousness: awake, alert  and oriented  Airway and Oxygen Therapy: Patient Spontanous Breathing  Post-op Pain: mild  Post-op Assessment: Post-op Vital signs reviewed  Post-op Vital Signs: Reviewed and stable  Complications: No apparent anesthesia complications2

## 2011-06-24 NOTE — MAU Note (Signed)
PT SAYS  HAS [facility] C/S ON 5-31.-  REPEAT-   - BUT HAS BEEN HURTING  930PM TONIGHT.   DR CHECKED  CERVIX LAST WEEK- CLOSED

## 2011-06-24 NOTE — Anesthesia Postprocedure Evaluation (Signed)
  Anesthesia Post-op Note  Patient: [name]  Procedure(s) Performed: Procedure(s) (LRB): CESAREAN SECTION (N/A)  Patient Location: PACU  Anesthesia Type: Spinal  Level of Consciousness: awake, alert  and oriented  Airway and Oxygen Therapy: Patient Spontanous Breathing  Post-op Pain: none  Post-op Assessment: Post-op Vital signs reviewed, Patient's Cardiovascular Status Stable, Respiratory Function Stable, Patent Airway, No signs of Nausea or vomiting, Pain level controlled, No headache and No backache  Post-op Vital Signs: Reviewed and stable  Complications: No apparent anesthesia complications

## 2011-06-25 ENCOUNTER — Encounter (HOSPITAL_COMMUNITY)
Admission: AD | Disposition: A | Discharge status: Home / Self Care | Payer: Self-pay | Source: Ambulatory Visit | Attending: Obstetrics and Gynecology

## 2011-06-25 ENCOUNTER — Encounter (HOSPITAL_COMMUNITY): Payer: Self-pay | Admitting: Anesthesiology

## 2011-06-25 ENCOUNTER — Encounter (HOSPITAL_COMMUNITY): Payer: Self-pay | Admitting: Obstetrics and Gynecology

## 2011-06-25 ENCOUNTER — Inpatient Hospital Stay (HOSPITAL_COMMUNITY): Payer: Medicaid Other | Admitting: Anesthesiology

## 2011-06-25 DIAGNOSIS — IMO0002 Reserved for concepts with insufficient information to code with codable children: Secondary | ICD-10-CM

## 2011-06-25 HISTORY — PX: LAPAROTOMY: SHX154

## 2011-06-25 LAB — CBC
HCT: 33.6 % — ABNORMAL LOW (ref 36.0–46.0)
HCT: 29.5 % — ABNORMAL LOW (ref 36.0–46.0)
HCT: 25.2 % — ABNORMAL LOW (ref 36.0–46.0)
HCT: 25.4 % — ABNORMAL LOW (ref 36.0–46.0)
Hemoglobin: 11 g/dL — ABNORMAL LOW (ref 12.0–15.0)
Hemoglobin: 9.7 g/dL — ABNORMAL LOW (ref 12.0–15.0)
Hemoglobin: 8.3 g/dL — ABNORMAL LOW (ref 12.0–15.0)
Hemoglobin: 8.4 g/dL — ABNORMAL LOW (ref 12.0–15.0)
MCH: 31.1 pg (ref 26.0–34.0)
MCH: 31.2 pg (ref 26.0–34.0)
MCH: 31.2 pg (ref 26.0–34.0)
MCH: 31.2 pg (ref 26.0–34.0)
MCHC: 32.7 g/dL (ref 30.0–36.0)
MCHC: 32.9 g/dL (ref 30.0–36.0)
MCHC: 32.9 g/dL (ref 30.0–36.0)
MCHC: 33.1 g/dL (ref 30.0–36.0)
MCV: 94.9 fL (ref 78.0–100.0)
MCV: 94.9 fL (ref 78.0–100.0)
MCV: 94.7 fL (ref 78.0–100.0)
MCV: 94.4 fL (ref 78.0–100.0)
Platelets: 173 10*3/uL (ref 150–400)
Platelets: 261 10*3/uL (ref 150–400)
Platelets: 206 10*3/uL (ref 150–400)
Platelets: 193 10*3/uL (ref 150–400)
RBC: 3.54 MIL/uL — ABNORMAL LOW (ref 3.87–5.11)
RBC: 3.11 MIL/uL — ABNORMAL LOW (ref 3.87–5.11)
RBC: 2.66 MIL/uL — ABNORMAL LOW (ref 3.87–5.11)
RBC: 2.69 MIL/uL — ABNORMAL LOW (ref 3.87–5.11)
RDW: 13.2 % (ref 11.5–15.5)
RDW: 13.3 % (ref 11.5–15.5)
RDW: 13.3 % (ref 11.5–15.5)
RDW: 13.3 % (ref 11.5–15.5)
WBC: 11.1 10*3/uL — ABNORMAL HIGH (ref 4.0–10.5)
WBC: 18.1 10*3/uL — ABNORMAL HIGH (ref 4.0–10.5)
WBC: 17.8 10*3/uL — ABNORMAL HIGH (ref 4.0–10.5)
WBC: 14.1 10*3/uL — ABNORMAL HIGH (ref 4.0–10.5)

## 2011-06-25 LAB — PROTIME-INR
INR: 1.01 (ref 0.00–1.49)
Prothrombin Time: 13.5 seconds (ref 11.6–15.2)

## 2011-06-25 LAB — RPR: RPR Ser Ql: NONREACTIVE

## 2011-06-25 LAB — APTT: aPTT: 27 seconds (ref 24–37)

## 2011-06-25 SURGERY — LAPAROTOMY, EXPLORATORY
Anesthesia: General | Site: Abdomen | Wound class: Clean Contaminated

## 2011-06-25 MED ORDER — ROCURONIUM BROMIDE 50 MG/5ML IV SOLN
INTRAVENOUS | Status: AC
  Filled 2011-06-25: qty 1

## 2011-06-25 MED ORDER — HEMOSTATIC AGENTS (NO CHARGE) OPTIME
TOPICAL | Status: DC | PRN
  Administered 2011-06-25: 3 via TOPICAL

## 2011-06-25 MED ORDER — PHENYLEPHRINE 40 MCG/ML (10ML) SYRINGE FOR IV PUSH (FOR BLOOD PRESSURE SUPPORT)
PREFILLED_SYRINGE | INTRAVENOUS | Status: AC
  Filled 2011-06-25: qty 5

## 2011-06-25 MED ORDER — FENTANYL CITRATE 0.05 MG/ML IJ SOLN
INTRAMUSCULAR | Status: AC
  Filled 2011-06-25: qty 2

## 2011-06-25 MED ORDER — SUCCINYLCHOLINE CHLORIDE 20 MG/ML IJ SOLN
INTRAMUSCULAR | Status: AC
  Filled 2011-06-25: qty 10

## 2011-06-25 MED ORDER — LACTATED RINGERS IV SOLN
INTRAVENOUS | Status: DC | PRN
  Administered 2011-06-25 (×3): via INTRAVENOUS

## 2011-06-25 MED ORDER — WITCH HAZEL-GLYCERIN EX PADS: 1.0000 "application " | MEDICATED_PAD | CUTANEOUS | Status: DC | PRN

## 2011-06-25 MED ORDER — PHENYLEPHRINE HCL 10 MG/ML IJ SOLN
INTRAMUSCULAR | Status: DC | PRN
  Administered 2011-06-25 (×3): .08 mg via INTRAVENOUS
  Administered 2011-06-25: .04 mg via INTRAVENOUS

## 2011-06-25 MED ORDER — GLYCOPYRROLATE 0.2 MG/ML IJ SOLN
INTRAMUSCULAR | Status: DC | PRN
  Administered 2011-06-25: 0.4 mg via INTRAVENOUS

## 2011-06-25 MED ORDER — MEPERIDINE HCL 25 MG/ML IJ SOLN
INTRAMUSCULAR | Status: AC
  Filled 2011-06-25: qty 1

## 2011-06-25 MED ORDER — MIDAZOLAM HCL 5 MG/5ML IJ SOLN
INTRAMUSCULAR | Status: DC | PRN
  Administered 2011-06-25: 2 mg via INTRAVENOUS

## 2011-06-25 MED ORDER — ZOLPIDEM TARTRATE 5 MG PO TABS: 5.0000 mg | ORAL_TABLET | Freq: Every evening | ORAL | Status: DC | PRN

## 2011-06-25 MED ORDER — FENTANYL CITRATE 0.05 MG/ML IJ SOLN
INTRAMUSCULAR | Status: AC
  Filled 2011-06-25: qty 5

## 2011-06-25 MED ORDER — MEPERIDINE HCL 25 MG/ML IJ SOLN
6.2500 mg | INTRAMUSCULAR | Status: DC | PRN
  Administered 2011-06-25: 6.25 mg via INTRAVENOUS

## 2011-06-25 MED ORDER — OXYTOCIN 10 UNIT/ML IJ SOLN
INTRAMUSCULAR | Status: AC
  Filled 2011-06-25: qty 2

## 2011-06-25 MED ORDER — METOCLOPRAMIDE HCL 5 MG/ML IJ SOLN: 10.0000 mg | Freq: Once | INTRAMUSCULAR | Status: DC | PRN

## 2011-06-25 MED ORDER — OXYCODONE-ACETAMINOPHEN 5-325 MG PO TABS
1.0000 | ORAL_TABLET | ORAL | Status: DC | PRN
  Administered 2011-06-25: 2 via ORAL
  Administered 2011-06-30: 1 via ORAL
  Filled 2011-06-25: qty 2
  Filled 2011-06-25: qty 1
  Filled 2011-06-25: qty 2

## 2011-06-25 MED ORDER — EPHEDRINE SULFATE 50 MG/ML IJ SOLN: INTRAMUSCULAR | Status: DC | PRN

## 2011-06-25 MED ORDER — ONDANSETRON HCL 4 MG/2ML IJ SOLN
4.0000 mg | INTRAMUSCULAR | Status: DC | PRN
  Administered 2011-06-26 – 2011-07-02 (×7): 4 mg via INTRAVENOUS
  Filled 2011-06-25 (×7): qty 2

## 2011-06-25 MED ORDER — IBUPROFEN 600 MG PO TABS
600.0000 mg | ORAL_TABLET | Freq: Four times a day (QID) | ORAL | Status: DC
  Administered 2011-06-25 – 2011-06-26 (×3): 600 mg via ORAL
  Filled 2011-06-25 (×4): qty 1

## 2011-06-25 MED ORDER — SENNOSIDES-DOCUSATE SODIUM 8.6-50 MG PO TABS
2.0000 | ORAL_TABLET | Freq: Every day | ORAL | Status: DC
  Administered 2011-06-25 – 2011-06-26 (×2): 2 via ORAL

## 2011-06-25 MED ORDER — LANOLIN HYDROUS EX OINT: 1.0000 "application " | TOPICAL_OINTMENT | CUTANEOUS | Status: DC | PRN

## 2011-06-25 MED ORDER — NEOSTIGMINE METHYLSULFATE 1 MG/ML IJ SOLN
INTRAMUSCULAR | Status: DC | PRN
  Administered 2011-06-25: 2 mg via INTRAVENOUS

## 2011-06-25 MED ORDER — PROPOFOL 10 MG/ML IV EMUL
INTRAVENOUS | Status: AC
  Filled 2011-06-25: qty 20

## 2011-06-25 MED ORDER — ONDANSETRON HCL 4 MG/2ML IJ SOLN
INTRAMUSCULAR | Status: AC
  Filled 2011-06-25: qty 2

## 2011-06-25 MED ORDER — LIDOCAINE HCL (CARDIAC) 20 MG/ML IV SOLN
INTRAVENOUS | Status: AC
  Filled 2011-06-25: qty 5

## 2011-06-25 MED ORDER — TETANUS-DIPHTH-ACELL PERTUSSIS 5-2.5-18.5 LF-MCG/0.5 IM SUSP
0.5000 mL | Freq: Once | INTRAMUSCULAR | Status: AC
  Administered 2011-07-01: 0.5 mL via INTRAMUSCULAR
  Filled 2011-06-25 (×2): qty 0.5

## 2011-06-25 MED ORDER — SUCCINYLCHOLINE CHLORIDE 20 MG/ML IJ SOLN
INTRAMUSCULAR | Status: DC | PRN
  Administered 2011-06-25: 120 mg via INTRAVENOUS

## 2011-06-25 MED ORDER — MENTHOL 3 MG MT LOZG
1.0000 | LOZENGE | OROMUCOSAL | Status: DC | PRN
  Filled 2011-06-25: qty 9

## 2011-06-25 MED ORDER — MIDAZOLAM HCL 2 MG/2ML IJ SOLN
INTRAMUSCULAR | Status: AC
  Filled 2011-06-25: qty 2

## 2011-06-25 MED ORDER — HYDROMORPHONE HCL PF 1 MG/ML IJ SOLN
INTRAMUSCULAR | Status: AC
  Filled 2011-06-25: qty 1

## 2011-06-25 MED ORDER — FENTANYL CITRATE 0.05 MG/ML IJ SOLN
25.0000 ug | INTRAMUSCULAR | Status: DC | PRN
  Administered 2011-06-25 (×4): 50 ug via INTRAVENOUS

## 2011-06-25 MED ORDER — BISACODYL 10 MG RE SUPP
10.0000 mg | Freq: Every day | RECTAL | Status: DC | PRN
  Filled 2011-06-25: qty 1

## 2011-06-25 MED ORDER — HYDROMORPHONE HCL PF 1 MG/ML IJ SOLN
INTRAMUSCULAR | Status: DC | PRN
  Administered 2011-06-25: 1 mg via INTRAVENOUS

## 2011-06-25 MED ORDER — PROPOFOL 10 MG/ML IV EMUL
INTRAVENOUS | Status: DC | PRN
  Administered 2011-06-25: 180 mg via INTRAVENOUS
  Administered 2011-06-25: 20 mg via INTRAVENOUS

## 2011-06-25 MED ORDER — SIMETHICONE 80 MG PO CHEW
80.0000 mg | CHEWABLE_TABLET | Freq: Three times a day (TID) | ORAL | Status: DC
  Administered 2011-06-25 – 2011-06-26 (×4): 80 mg via ORAL

## 2011-06-25 MED ORDER — SIMETHICONE 80 MG PO CHEW
80.0000 mg | CHEWABLE_TABLET | ORAL | Status: DC | PRN
  Administered 2011-06-26: 80 mg via ORAL

## 2011-06-25 MED ORDER — ONDANSETRON HCL 4 MG PO TABS
4.0000 mg | ORAL_TABLET | ORAL | Status: DC | PRN
  Administered 2011-06-30: 4 mg via ORAL
  Filled 2011-06-25: qty 1

## 2011-06-25 MED ORDER — PRENATAL MULTIVITAMIN CH: 1.0000 | ORAL_TABLET | Freq: Every day | ORAL | Status: DC

## 2011-06-25 MED ORDER — BUPIVACAINE HCL (PF) 0.25 % IJ SOLN
INTRAMUSCULAR | Status: AC
  Filled 2011-06-25: qty 30

## 2011-06-25 MED ORDER — NEOSTIGMINE METHYLSULFATE 1 MG/ML IJ SOLN
INTRAMUSCULAR | Status: AC
  Filled 2011-06-25: qty 10

## 2011-06-25 MED ORDER — LACTATED RINGERS IV SOLN
INTRAVENOUS | Status: DC
  Administered 2011-06-25 – 2011-06-27 (×2): via INTRAVENOUS

## 2011-06-25 MED ORDER — FENTANYL CITRATE 0.05 MG/ML IJ SOLN
INTRAMUSCULAR | Status: DC | PRN
  Administered 2011-06-25 (×4): 50 ug via INTRAVENOUS
  Administered 2011-06-25: 150 ug via INTRAVENOUS

## 2011-06-25 MED ORDER — OXYTOCIN 20 UNITS IN LACTATED RINGERS INFUSION - SIMPLE
125.0000 mL/h | INTRAVENOUS | Status: DC
  Administered 2011-06-25: 125 mL/h via INTRAVENOUS
  Filled 2011-06-25 (×2): qty 1000

## 2011-06-25 MED ORDER — DIBUCAINE 1 % RE OINT: 1.0000 "application " | TOPICAL_OINTMENT | RECTAL | Status: DC | PRN

## 2011-06-25 MED ORDER — OXYTOCIN 20 UNITS IN LACTATED RINGERS INFUSION - SIMPLE
INTRAVENOUS | Status: DC | PRN
  Administered 2011-06-25: 20 [IU] via INTRAVENOUS

## 2011-06-25 MED ORDER — DIPHENHYDRAMINE HCL 25 MG PO CAPS: 25.0000 mg | ORAL_CAPSULE | Freq: Four times a day (QID) | ORAL | Status: DC | PRN

## 2011-06-25 MED ORDER — GLYCOPYRROLATE 0.2 MG/ML IJ SOLN
INTRAMUSCULAR | Status: AC
  Filled 2011-06-25: qty 2

## 2011-06-25 MED ORDER — LIDOCAINE HCL (CARDIAC) 20 MG/ML IV SOLN
INTRAVENOUS | Status: DC | PRN
  Administered 2011-06-25: 50 mg via INTRAVENOUS

## 2011-06-25 MED ORDER — 0.9 % SODIUM CHLORIDE (POUR BTL) OPTIME
TOPICAL | Status: DC | PRN
  Administered 2011-06-25 (×2): 1000 mL

## 2011-06-25 MED ORDER — ROCURONIUM BROMIDE 100 MG/10ML IV SOLN
INTRAVENOUS | Status: DC | PRN
  Administered 2011-06-25: 10 mg via INTRAVENOUS
  Administered 2011-06-25: 5 mg via INTRAVENOUS
  Administered 2011-06-25: 45 mg via INTRAVENOUS

## 2011-06-25 SURGICAL SUPPLY — 47 items
BARRIER ADHS 3X4 INTERCEED (GAUZE/BANDAGES/DRESSINGS) IMPLANT
BRR ADH 4X3 ABS CNTRL BYND (GAUZE/BANDAGES/DRESSINGS)
CANISTER SUCTION 2500CC (MISCELLANEOUS) ×2 IMPLANT
CHLORAPREP W/TINT 26ML (MISCELLANEOUS) ×2 IMPLANT
CLOTH BEACON ORANGE TIMEOUT ST (SAFETY) ×2 IMPLANT
CONT PATH 16OZ SNAP LID 3702 (MISCELLANEOUS) ×1 IMPLANT
DECANTER SPIKE VIAL GLASS SM (MISCELLANEOUS) IMPLANT
DISSECTOR SPONGE CHERRY (GAUZE/BANDAGES/DRESSINGS) IMPLANT
DRAIN JACKSON RD 7FR 3/32 (WOUND CARE) ×1 IMPLANT
DRESSING TELFA 8X3 (GAUZE/BANDAGES/DRESSINGS) ×1 IMPLANT
DRSG COVADERM 4X10 (GAUZE/BANDAGES/DRESSINGS) ×1 IMPLANT
EVACUATOR SILICONE 100CC (DRAIN) ×1 IMPLANT
GAUZE SPONGE 4X4 16PLY XRAY LF (GAUZE/BANDAGES/DRESSINGS) ×2 IMPLANT
GLOVE BIO SURGEON STRL SZ7.5 (GLOVE) ×4 IMPLANT
GLOVE BIOGEL PI IND STRL 7.5 (GLOVE) ×1 IMPLANT
GLOVE BIOGEL PI INDICATOR 7.5 (GLOVE) ×1
GOWN PREVENTION PLUS LG XLONG (DISPOSABLE) ×4 IMPLANT
HEMOSTAT SURGICEL 2X14 (HEMOSTASIS) ×2 IMPLANT
HEMOSTAT SURGICEL 2X3 (HEMOSTASIS) ×2 IMPLANT
HEMOSTAT SURGICEL 4X8 (HEMOSTASIS) IMPLANT
NDL HYPO 25X1 1.5 SAFETY (NEEDLE) IMPLANT
NEEDLE HYPO 25X1 1.5 SAFETY (NEEDLE) ×2 IMPLANT
NS IRRIG 1000ML POUR BTL (IV SOLUTION) ×3 IMPLANT
PACK ABDOMINAL GYN (CUSTOM PROCEDURE TRAY) ×2 IMPLANT
PAD OB MATERNITY 4.3X12.25 (PERSONAL CARE ITEMS) ×2 IMPLANT
RETRACTOR WND ALEXIS 25 LRG (MISCELLANEOUS) IMPLANT
RTRCTR WOUND ALEXIS 25CM LRG (MISCELLANEOUS) ×2
SPONGE LAP 18X18 X RAY DECT (DISPOSABLE) ×7 IMPLANT
STAPLER VISISTAT 35W (STAPLE) IMPLANT
SUT CHROMIC 2 0 CT 1 (SUTURE) ×2 IMPLANT
SUT CHROMIC 2 0 SH (SUTURE) ×1 IMPLANT
SUT MNCRL AB 3-0 PS2 27 (SUTURE) ×2 IMPLANT
SUT MON AB 3-0 SH 27 (SUTURE)
SUT MON AB 3-0 SH27 (SUTURE) IMPLANT
SUT PDS AB 1 CTX 36 (SUTURE) IMPLANT
SUT PLAIN 2 0 XLH (SUTURE) ×2 IMPLANT
SUT VIC AB 0 CT1 18XCR BRD8 (SUTURE) IMPLANT
SUT VIC AB 0 CT1 27 (SUTURE) ×4
SUT VIC AB 0 CT1 27XBRD ANBCTR (SUTURE) ×4 IMPLANT
SUT VIC AB 0 CT1 8-18 (SUTURE) ×2
SUT VICRYL 0 TIES 12 18 (SUTURE) ×2 IMPLANT
SYR CONTROL 10ML LL (SYRINGE) ×1 IMPLANT
TAPE CLOTH SURG 4X10 WHT LF (GAUZE/BANDAGES/DRESSINGS) ×1 IMPLANT
TOWEL OR 17X24 6PK STRL BLUE (TOWEL DISPOSABLE) ×4 IMPLANT
TRAY FOLEY CATH 14FR (SET/KITS/TRAYS/PACK) ×2 IMPLANT
WATER STERILE IRR 1000ML POUR (IV SOLUTION) ×1 IMPLANT
YANKAUER SUCT BULB TIP NO VENT (SUCTIONS) ×1 IMPLANT

## 2011-06-25 NOTE — Progress Notes (Signed)
Called to see patient with worsening gaseous distension and abdominal pain, SOB.  "Doesn't feel right". BP 110/62, pulse 130, 02 sat 99%.  Diaphoretic. Received Dulcolax supp this am without results. Vomited x 1. Symptoms started after getting up to BR.  Had more pain on right than left. Also feels severe pain in rectum and vagina.  Assessment: Patient with abdominal pain. Faint bowel sounds. Abdomen distended but soft, painful to palpation. CBC done--Hgb 9.7 (11 this am) Dr. Su Hilt in to see patient--utilized bedside US for abdominal eval. Appears to have fluid in abdomen.  Dr. Su Hilt plans exploratory laparotomy. R&B reviewed with patient and husband--they wish to proceed.  Nigel Bridgeman, CNM, [place] 06/25/11 2:35p

## 2011-06-25 NOTE — Anesthesia Preprocedure Evaluation (Signed)
Anesthesia Evaluation  Patient identified by MRN, date of birth, ID band Patient awake    Reviewed: Allergy & Precautions, H&P , NPO status , Patient's Chart, lab work & pertinent test results  Airway Mallampati: II TM Distance: >3 FB Neck ROM: full    Dental No notable dental hx. (+) Teeth Intact   Pulmonary neg pulmonary ROS,  breath sounds clear to auscultation  Pulmonary exam normal       Cardiovascular negative cardio ROS  Rhythm:Regular Rate:Normal     Neuro/Psych  Headaches, Anxiety    GI/Hepatic negative GI ROS, Neg liver ROS,   Endo/Other  negative endocrine ROS  Renal/GU Hx.o renal calculi   negative genitourinary   Musculoskeletal negative musculoskeletal ROS (+)   Abdominal Normal abdominal exam  (+)   Peds negative pediatric ROS (+)  Hematology Post operative hemorrhage    Anesthesia Other Findings   Reproductive/Obstetrics negative OB ROS                           Anesthesia Physical  Anesthesia Plan  ASA: II and Emergent  Anesthesia Plan: General   Post-op Pain Management:    Induction: Intravenous, Rapid sequence and Cricoid pressure planned  Airway Management Planned: Oral ETT  Additional Equipment:   Intra-op Plan:   Post-operative Plan: Extubation in OR  Informed Consent: I have reviewed the patients History and Physical, chart, labs and discussed the procedure including the risks, benefits and alternatives for the proposed anesthesia with the patient or authorized representative who has indicated his/her understanding and acceptance.   Dental advisory given  Plan Discussed with: CRNA, Surgeon and Anesthesiologist  Anesthesia Plan Comments:         Anesthesia Quick Evaluation

## 2011-06-25 NOTE — Transfer of Care (Signed)
Immediate Anesthesia Transfer of Care Note  Patient: [name]  Procedure(s) Performed: Procedure(s) (LRB): EXPLORATORY LAPAROTOMY (N/A)  Patient Location: PACU  Anesthesia Type: General  Level of Consciousness: awake, alert  and oriented  Airway & Oxygen Therapy: Patient Spontanous Breathing and Patient connected to nasal cannula oxygen  Post-op Assessment: Report given to PACU RN and Post -op Vital signs reviewed and stable  Post vital signs: Reviewed and stable  Complications: No apparent anesthesia complications

## 2011-06-25 NOTE — OR Nursing (Signed)
Patient put in stirrups at end of surgical procedure for Dr. Su Hilt to do a vaginal exam then returned to supine position. Ultrasound per Dr. Su Hilt in OR.

## 2011-06-25 NOTE — Progress Notes (Signed)
UR chart review completed.  

## 2011-06-25 NOTE — Anesthesia Procedure Notes (Signed)
Procedure Name: Intubation Date/Time: 06/25/2011 3:32 PM Performed by: Shanon Payor Pre-anesthesia Checklist: Patient identified, Emergency Drugs available, Suction available, Timeout performed and Patient being monitored Patient Re-evaluated:Patient Re-evaluated prior to inductionOxygen Delivery Method: Circle system utilized Preoxygenation: Pre-oxygenation with 100% oxygen Intubation Type: IV induction, Rapid sequence and Cricoid Pressure applied Laryngoscope Size: Mac and 3 Grade View: Grade I Tube type: Oral Tube size: 7.0 mm Number of attempts: 1 Airway Equipment and Method: Stylet Placement Confirmation: ETT inserted through vocal cords under direct vision,  positive ETCO2 and breath sounds checked- equal and bilateral Secured at: 22 cm Tube secured with: Tape Dental Injury: Teeth and Oropharynx as per pre-operative assessment

## 2011-06-25 NOTE — Progress Notes (Signed)
Late entry note: Placed back on Contact precautions right after d/c order.  Pt. Ambulating to BR per self this am, voiding w/o difficulty. BS+. Abd. Distended. Simethicone given, noted belching. Ambulating and pain worsening to move. Vickie Latham notified and reqested suppository, given w/o relief noted. Pain increasing and pt. Sweating. Afebrile. Notified Vickie latham of increase pulse, sweating, and pain. MD's into assess pt. And orders received, taken to OR. New IV inserted.

## 2011-06-25 NOTE — Progress Notes (Signed)
Completed 5 day treatment of pre-op ABT for MRSA. Contact Isolation d/c'd.

## 2011-06-25 NOTE — Progress Notes (Signed)
Subjective: Postpartum Day 1: Cesarean Delivery repeat, presented in active labor. Patient reports up ad lib, voiding without difficulty.  No syncope or dizziness. Undecided regarding contraception.  No flatus yet.  Objective: Vital signs in last 24 hours: Temp:  [97.8 F (36.6 C)-99.1 F (37.3 C)] 98.3 F (36.8 C) (05/30 0430) Pulse Rate:  [69-98] 69  (05/30 0430) Resp:  [16-31] 18  (05/30 0430) BP: (102-143)/(44-99) 122/79 mmHg (05/30 0430) SpO2:  [95 %-100 %] 97 % (05/30 0430)  Physical Exam:  General: alert Lochia: appropriate Uterine Fundus: firm Incision: Dressing CDI.  Positive bowel sounds, but definite tympanic gaseous distension. DVT Evaluation: No evidence of DVT seen on physical exam. Negative Homan's sign. JP drain 20 cc overnight.   Basename 06/25/11 0525 06/24/11 1235  HGB 11.0* 11.3*  HCT 33.6* 33.8*    Assessment/Plan: Status post Cesarean section. Doing well postoperatively.  Continue current care. Ambulate.  [name] 06/25/2011, 9:15 AM

## 2011-06-25 NOTE — Anesthesia Postprocedure Evaluation (Signed)
Anesthesia Post Note  Patient: [name]  Procedure(s) Performed: Procedure(s) (LRB): EXPLORATORY LAPAROTOMY (N/A)  Anesthesia type: General  Patient location: PACU  Post pain: Pain level controlled  Post assessment: Post-op Vital signs reviewed  Last Vitals:  Filed Vitals:   06/25/11 2100  BP: 115/64  Pulse: 91  Temp: 37.4 C  Resp: 20    Post vital signs: Reviewed  Level of consciousness: sedated  Complications: No apparent anesthesia complications

## 2011-06-26 ENCOUNTER — Encounter (HOSPITAL_COMMUNITY): Payer: Self-pay | Admitting: Obstetrics and Gynecology

## 2011-06-26 ENCOUNTER — Encounter (HOSPITAL_COMMUNITY): Admission: RE | Payer: Self-pay | Source: Ambulatory Visit

## 2011-06-26 ENCOUNTER — Inpatient Hospital Stay (HOSPITAL_COMMUNITY)
Admission: RE | Admit: 2011-06-26 | Payer: Medicaid Other | Source: Ambulatory Visit | Admitting: Obstetrics and Gynecology

## 2011-06-26 LAB — CBC
HCT: 21.6 % — ABNORMAL LOW (ref 36.0–46.0)
HCT: 22.2 % — ABNORMAL LOW (ref 36.0–46.0)
HCT: 23.2 % — ABNORMAL LOW (ref 36.0–46.0)
Hemoglobin: 7.2 g/dL — ABNORMAL LOW (ref 12.0–15.0)
Hemoglobin: 7.4 g/dL — ABNORMAL LOW (ref 12.0–15.0)
Hemoglobin: 7.8 g/dL — ABNORMAL LOW (ref 12.0–15.0)
MCH: 31.8 pg (ref 26.0–34.0)
MCH: 31.9 pg (ref 26.0–34.0)
MCH: 32 pg (ref 26.0–34.0)
MCHC: 33.3 g/dL (ref 30.0–36.0)
MCHC: 33.3 g/dL (ref 30.0–36.0)
MCHC: 33.6 g/dL (ref 30.0–36.0)
MCV: 95.1 fL (ref 78.0–100.0)
MCV: 95.3 fL (ref 78.0–100.0)
MCV: 95.6 fL (ref 78.0–100.0)
Platelets: 186 10*3/uL (ref 150–400)
Platelets: 195 10*3/uL (ref 150–400)
Platelets: 197 10*3/uL (ref 150–400)
RBC: 2.26 MIL/uL — ABNORMAL LOW (ref 3.87–5.11)
RBC: 2.33 MIL/uL — ABNORMAL LOW (ref 3.87–5.11)
RBC: 2.44 MIL/uL — ABNORMAL LOW (ref 3.87–5.11)
RDW: 13.4 % (ref 11.5–15.5)
RDW: 13.7 % (ref 11.5–15.5)
RDW: 13.9 % (ref 11.5–15.5)
WBC: 12.3 10*3/uL — ABNORMAL HIGH (ref 4.0–10.5)
WBC: 13.5 10*3/uL — ABNORMAL HIGH (ref 4.0–10.5)
WBC: 13.5 10*3/uL — ABNORMAL HIGH (ref 4.0–10.5)

## 2011-06-26 LAB — PREPARE RBC (CROSSMATCH)

## 2011-06-26 SURGERY — Surgical Case
Anesthesia: Regional

## 2011-06-26 MED ORDER — SODIUM CHLORIDE 0.9 % IV SOLN
3.0000 g | Freq: Four times a day (QID) | INTRAVENOUS | Status: DC
  Administered 2011-06-26 – 2011-06-29 (×16): 3 g via INTRAVENOUS
  Filled 2011-06-26 (×19): qty 3

## 2011-06-26 MED ORDER — DIPHENHYDRAMINE HCL 50 MG/ML IJ SOLN: 12.5000 mg | Freq: Four times a day (QID) | INTRAMUSCULAR | Status: DC | PRN

## 2011-06-26 MED ORDER — DIPHENHYDRAMINE HCL 12.5 MG/5ML PO ELIX
12.5000 mg | ORAL_SOLUTION | Freq: Four times a day (QID) | ORAL | Status: DC | PRN
  Filled 2011-06-26: qty 5

## 2011-06-26 MED ORDER — ACETAMINOPHEN 325 MG PO TABS: 650.0000 mg | ORAL_TABLET | Freq: Four times a day (QID) | ORAL | Status: DC | PRN

## 2011-06-26 MED ORDER — CEFAZOLIN SODIUM 1-5 GM-% IV SOLN
INTRAVENOUS | Status: AC
  Filled 2011-06-26: qty 100

## 2011-06-26 MED ORDER — DIPHENHYDRAMINE HCL 25 MG PO CAPS: 25.0000 mg | ORAL_CAPSULE | Freq: Every evening | ORAL | Status: DC | PRN

## 2011-06-26 MED ORDER — SODIUM CHLORIDE 0.9 % IJ SOLN: 9.0000 mL | INTRAMUSCULAR | Status: DC | PRN

## 2011-06-26 MED ORDER — HYDROMORPHONE 0.3 MG/ML IV SOLN
INTRAVENOUS | Status: DC
  Administered 2011-06-26: 2.11 mg via INTRAVENOUS
  Administered 2011-06-26: 5.59 mg via INTRAVENOUS
  Administered 2011-06-26: 16:00:00 via INTRAVENOUS
  Administered 2011-06-26: 0.4 mg via INTRAVENOUS
  Administered 2011-06-26: 0.999 mg via INTRAVENOUS
  Administered 2011-06-26: 05:00:00 via INTRAVENOUS
  Administered 2011-06-27: 0.3 mg via INTRAVENOUS
  Administered 2011-06-27: 0.599 mg via INTRAVENOUS
  Administered 2011-06-27: 0.2 mg via INTRAVENOUS
  Administered 2011-06-27: 1.07 mg via INTRAVENOUS
  Administered 2011-06-27: 0.599 mg via INTRAVENOUS
  Administered 2011-06-28: 3.33 mL via INTRAVENOUS
  Administered 2011-06-28: 0.799 mg via INTRAVENOUS
  Administered 2011-06-28: 20:00:00 via INTRAVENOUS
  Administered 2011-06-29: 1.79 mg via INTRAVENOUS
  Administered 2011-06-29: 0.799 mg via INTRAVENOUS
  Administered 2011-06-29: 1.19 mg via INTRAVENOUS
  Administered 2011-06-29: 0.799 mg via INTRAVENOUS
  Administered 2011-06-29: 1.39 mg via INTRAVENOUS
  Administered 2011-06-29: 0.799 mg via INTRAVENOUS
  Administered 2011-06-30: 0.1398 mg via INTRAVENOUS
  Filled 2011-06-26 (×5): qty 25

## 2011-06-26 MED ORDER — NALOXONE HCL 0.4 MG/ML IJ SOLN: 0.4000 mg | INTRAMUSCULAR | Status: DC | PRN

## 2011-06-26 MED ORDER — ONDANSETRON HCL 4 MG/2ML IJ SOLN: 4.0000 mg | Freq: Four times a day (QID) | INTRAMUSCULAR | Status: DC | PRN

## 2011-06-26 NOTE — Progress Notes (Signed)
Notified Dr. Stefano Gaul regarding concerns w/abd. Distention. Tight, unable to assess fundus. Discomfort. Hypoactive BS to Upper quads. Active to Lower quads. Jp drain intact. Skin clammy. VS WDL.  Stated will send Shelly to assess pt.

## 2011-06-26 NOTE — Progress Notes (Signed)
Subjective: Postpartum Day 2: Cesarean Delivery Patient reports fatigue. Abd pain is better.    Objective: Vital signs in last 24 hours: Temp:  [98.1 F (36.7 C)-101.9 F (38.8 C)] 99.5 F (37.5 C) (05/31 0805) Pulse Rate:  [91-131] 97  (05/31 0900) Resp:  [16-24] 20  (05/31 0900) BP: (101-130)/(51-102) 102/51 mmHg (05/31 0900) SpO2:  [96 %-100 %] 100 % (05/31 0900) Weight:  [83.734 kg (184 lb 9.6 oz)] 83.734 kg (184 lb 9.6 oz) (05/30 2130)  Physical Exam:  General: alert and no distress Lochia: appropriate Uterine Fundus: firm Incision: dressing clean DVT Evaluation: No evidence of DVT seen on physical exam. Chest: clear Heart: RRR   Basename 06/26/11 0812 06/26/11 0405  HGB 7.2* 7.4*  HCT 21.6* 22.2*    Assessment/Plan: Status post Cesarean section. Postoperative course complicated by bleeding. Fatigued. Increased temp due to possible chorioamnionitis.  We'll transfuse 3 units of packed red blood cells. The risk and benefits of blood transfusion were reviewed. Continue Unasyn.  Janine Limbo 06/26/2011, 9:39 AM

## 2011-06-26 NOTE — Op Note (Addendum)
Cesarean Section Procedure Note  Indications: Acute abdomen suspicious for intraabdominal bleed  Pre-operative Diagnosis: acute abdominal bleeding   Post-operative Diagnosis: Acute intraabdominal bleed  Procedure:  EXPLORATORY LAPAROTOMY  Surgeon: Osborn Coho, MD    Assistants: Nigel Bridgeman, CNM  Intraop Consult: Jaymes Graff, MD  Anesthesia: General  Anesthesiologist: Mal Amabile, MD   Procedure Details  The patient was taken to the operating room secondary to acute abdomen after the risks, benefits, complications, treatment options, and expected outcomes were discussed with the patient.  The patient concurred with the proposed plan, giving informed consent which was signed and witnessed. The patient was taken to Operating Room 3, identified as [name] and the procedure verified as Exploratory Laparotomy.  A Time Out was held and the above information confirmed.  The prior incision was entered down through the fascia peritoneum and into the intra-abdominal cavity.  Once entering the abdominal cavity there was approximately 1200 to 1500 mL of blood clot and blood evacuated. Exploration was performed and there was no obvious site of active bleeding.  Uterine incision appeared intact clean and dry. There was a defect noted in the lower portion of the right broad ligament but there was no obvious bleeding noted from this area. This was the only source of possible bleeding that was noted after exploration which included the omentum, muscle and bladder flap.  Decision was made to ask for an intraop consultation from one of my partners and Dillard came for exploration as well. Together we did not identify any area of active bleeding and the decision was made to pack the area in the broad ligament where there was a noted defect with Surgicel and Gelfoam.  A size 7 JP drain was left in the intra-abdominal cavity in the area of the broad ligament and by the bladder flap.  The  peritoneum was repaired with 2-0 chromic via a running stitch. The fascia was repaired with 0 vicryl via a running stitch. Again the muscle was investigated and there were no obvious areas of bleeding but any oozing that was noted was cauterized with the bovie.  The subcutaneous tissue was reapproximated with 3 interrupted sutures of 2-0 plain.  The skin was reapproximated with a subcuticular suture of 3-0 monocryl.  JP drian was sutured with silk. Steristrips were applied.  Speculum exam under anesthesia was performed and no vaginal lacerations, cervical lacerations or any obvious areas of bleeding were noted.  Instrument, sponge, and needle counts were correct prior to abdominal closure and at the conclusion of the case.  The patient was awaiting transfer to the recovery room in good condition.  Findings: Large hemoperitoneum. Intraop Hgb 8.3.  Estimated Blood Loss:  1200-1512ml in abdomen         Drains: foley to gravity with good UOP in OR (see flowsheet)         Total IV Fluids: see flowsheet         Complications:  None; patient tolerated the procedure well.         Disposition: PACU - hemodynamically stable.         Condition: stable  Attending Attestation: I performed the procedure.  Called back to OR secondary to OR staff believed the abdomen becoming more distended "before their eyes".  Bedside u/s performed and no obvious fluid collections noted and no increased output from JP drain.  Decision made to transfer pt to PACU and then to AICU for close observation and serial HCTs overnight.

## 2011-06-26 NOTE — Progress Notes (Signed)
Patient ID: [name], female   DOB: 1985-09-03, 26 y.o.   MRN: [id number] Subjective: Postpartum Day 2: Cesarean Delivery and s/p open abdominal surgery day 1 Moved to [facility] from AICU this evening Patient reports incisional pain and tolerating PO.  Pain scale 3/10 on PCA No flatus, denies Nausea BF well Mood stable, bonding well   Objective: Vital signs in last 24 hours: Temp:  [98.1 F (36.7 C)-101.9 F (38.8 C)] 98.2 F (36.8 C) (05/31 1830) Pulse Rate:  [85-115] 99  (05/31 1830) Resp:  [16-22] 18  (05/31 2031) BP: (102-126)/(39-75) 118/75 mmHg (05/31 1830) SpO2:  [96 %-100 %] 99 % (05/31 2031) Weight:  [184 lb 9.6 oz (83.734 kg)] 184 lb 9.6 oz (83.734 kg) (05/30 2130)  Physical Exam:  General: alert and no distress Heart: RRR Lungs: CTAB Abdomen: BS x4, some distension noted, very soft, JP abd drain w sm amt serosanguinous  Uterine Fundus: unable to palpate secondary to pt pain Incision: dressing CDI Lochia: scant DVT Evaluation: No evidence of DVT seen on physical exam. Negative Homan's sign. No significant calf/ankle edema.   Basename 06/26/11 0812 06/26/11 0405  HGB 7.2* 7.4*  HCT 21.6* 22.2*    Assessment/Plan: Status post Cesarean section. Postoperative course complicated by postoperative bleeding into abdomen  Stable Will decrease IVF's KVO overnight Advance DAT in the AM Foley out in the AM Repeat cbc in the AM. D/W Dr. Darrick Meigs M 06/26/2011, 9:01 PM

## 2011-06-26 NOTE — Progress Notes (Signed)
Post Partum Day/POD#1  Subjective: Notified by VL, CNM that pt does not look good.  Tachycardic to 130s and worsening distention.  Upon seeing pt she reports leaving bathroom feeling lightheaded dizzy, excrutiating pain in lower abdomen, pain in rectum, nausea and SOB.  She denied CP.  Reports passing flatus x 1 since OR.  Objective: BP 100/60s Pulse 110s-120s, O2Sat 100% RA  Physical Exam:  General: alert, flushed and moderate distress Lochia: appropriate, wnl Uterine Fundus: firm Abdomen: decreased BS, abdomen markedly softly distended, dressing c/d/i, tender with rebound and guarding DVT Evaluation: No evidence of DVT seen on physical exam, no calf tenderness, tr edema Anus no obvious external lesions  Hgb stable at 11 on am blood draw Stat CBC - Hgb 9.7 Bedside U/S with large amount of fluid in abdomen  Assessment/Plan: Acute abdomen - suspicious for intraabdominal bleed Plan to return to OR for Exp Lap - Pt is agreeable R/B/A discussed with the patient and her husband T&C for 2u PRBCs IV and IVF restarted at bedside 2g of Ancef to OR    Corinda Ammon Y 06/26/2011, 9:04 AM

## 2011-06-27 ENCOUNTER — Inpatient Hospital Stay (HOSPITAL_COMMUNITY): Payer: Medicaid Other

## 2011-06-27 DIAGNOSIS — IMO0002 Reserved for concepts with insufficient information to code with codable children: Secondary | ICD-10-CM | POA: Diagnosis not present

## 2011-06-27 LAB — CBC
HCT: 31 % — ABNORMAL LOW (ref 36.0–46.0)
Hemoglobin: 10.6 g/dL — ABNORMAL LOW (ref 12.0–15.0)
MCH: 30.7 pg (ref 26.0–34.0)
MCHC: 33.9 g/dL (ref 30.0–36.0)
MCV: 90.6 fL (ref 78.0–100.0)
Platelets: 188 10*3/uL (ref 150–400)
RBC: 3.42 MIL/uL — ABNORMAL LOW (ref 3.87–5.11)
RDW: 17.7 % — ABNORMAL HIGH (ref 11.5–15.5)
WBC: 12.7 10*3/uL — ABNORMAL HIGH (ref 4.0–10.5)

## 2011-06-27 LAB — TYPE AND SCREEN
ABO/RH(D): O POS
Antibody Screen: NEGATIVE
Unit division: 0
Unit division: 0
Unit division: 0

## 2011-06-27 MED ORDER — DEXTROSE IN LACTATED RINGERS 5 % IV SOLN
INTRAVENOUS | Status: DC
  Administered 2011-06-27 – 2011-06-28 (×2): via INTRAVENOUS

## 2011-06-27 MED ORDER — METOCLOPRAMIDE HCL 5 MG/ML IJ SOLN
10.0000 mg | Freq: Four times a day (QID) | INTRAMUSCULAR | Status: DC | PRN
  Administered 2011-06-27: 10 mg via INTRAVENOUS
  Filled 2011-06-27 (×3): qty 2

## 2011-06-27 MED ORDER — PHENOL 1.4 % MT LIQD
1.0000 | OROMUCOSAL | Status: DC | PRN
  Administered 2011-06-27 – 2011-06-28 (×4): 1 via OROMUCOSAL
  Filled 2011-06-27: qty 177

## 2011-06-27 MED ORDER — LORAZEPAM 2 MG/ML IJ SOLN
1.0000 mg | Freq: Three times a day (TID) | INTRAMUSCULAR | Status: DC | PRN
  Administered 2011-06-27 – 2011-06-28 (×2): 1 mg via INTRAVENOUS
  Filled 2011-06-27 (×2): qty 1

## 2011-06-27 NOTE — Progress Notes (Addendum)
S: c/o nausea, had zofran w/o relief no vomiting, pain stable, sleepy, Denies any SOB or chest pain O:  Filed Vitals:   06/26/11 2256  BP: 111/72  Pulse: 96  Temp: 98.5 F (36.9 C)  Resp: 20   PE: Gen: A&O  Chest lungs CTAB Heart: RRR Abd: soft, BSx4 jp drain w sm amt sanguinous  A: post-op nausea, may be from PCA meds P: reglan IVP and will keep NPO for a few hours to see if there is improvement

## 2011-06-27 NOTE — Progress Notes (Signed)
To X-Ray via w/c w/stand-by assist. Stood during x-ray holding on to nurse d/t weakness. Pt. Didn't think she would be able to lay on hard x-ray table w/o extreme discomfort. Tolerated standing xray. To rm. Via w/c. Up to recliner stand by assist. Noted decrease out put in JP drain. Peri care before transporting.

## 2011-06-27 NOTE — Plan of Care (Signed)
Problem: Phase II Progression Outcomes Goal: Pain controlled on oral analgesia Outcome: Not Met (add Reason) Has a dilaudid pca, oral medications are currently not being used for pain control

## 2011-06-27 NOTE — Progress Notes (Signed)
[name] is a 26 y.o. year old female. Cesarean section day 3 Exploratory laparotomy day 2  Subjective:  The patient indicates that her throat is sore from her NG tube. Her pain is rated 4/10 but she is not using her PCA pain medication.  Objective:  BP 124/76  Pulse 81  Temp(Src) 99.3 F (37.4 C) (Axillary)  Resp 19  Ht 5\' 3"  (1.6 m)  Wt 83.734 kg (184 lb 9.6 oz)  BMI 32.70 kg/m2  SpO2 97%  LMP 09/15/2010   CBC    Component Value Date/Time   WBC 12.7* 06/27/2011 0525   RBC 3.42* 06/27/2011 0525   HGB 10.6* 06/27/2011 0525   HCT 31.0* 06/27/2011 0525   PLT 188 06/27/2011 0525   MCV 90.6 06/27/2011 0525   MCH 30.7 06/27/2011 0525   MCHC 33.9 06/27/2011 0525   RDW 17.7* 06/27/2011 0525   LYMPHSABS 2.2 12/09/2010 0823   MONOABS 0.5 12/09/2010 0823   EOSABS 0.1 12/09/2010 0823   BASOSABS 0.0 12/09/2010 0823   Chest: Clear  Heart: Regular rate and rhythm  Abdomen: Bowel sounds positive, appropriately tender, distended  Extremities: No masses  Intra-abdominal drains: Serosanguineous fluid that is clearing  Abdominal x-ray: Dilated loops of small bowel with air-fluid levels, ileus versus obstruction  Assessment:  Ileus versus obstruction: I favor ileus  Clinically the patient is improved  Plan:  Continue NG drainage.  N.p.o.  Continue antibiotics.  Chloraseptic Spray for the patient's throat.  CBC in the morning.  Leonard Schwartz M.D.  06/27/2011  4:35 PM

## 2011-06-27 NOTE — Progress Notes (Signed)
Subjective: Postpartum Day 3: Cesarean Delivery Patient reports gas pain.  Objective: Vital signs in last 24 hours: Temp:  [98.1 F (36.7 C)-99.5 F (37.5 C)] 98.8 F (37.1 C) (06/01 0601) Pulse Rate:  [85-104] 103  (06/01 0601) Resp:  [16-21] 20  (06/01 0601) BP: (105-130)/(39-86) 130/86 mmHg (06/01 0601) SpO2:  [96 %-100 %] 98 % (06/01 0601)  Physical Exam:  General: alert, cooperative and no distress Lochia: appropriate Uterine Fundus: firm Incision: dressing with a small stain DVT Evaluation: No evidence of DVT seen on physical exam. Abd: distended, BS +, appropriately tender JP with serosang. fluid   Basename 06/27/11 0525 06/26/11 0812  HGB 10.6* 7.2*  HCT 31.0* 21.6*    Assessment/Plan: Status post Cesarean section and expl. Lap. Abd x ray. May have an ileus.  May need NG tube.  [name] 06/27/2011, 9:19 AM

## 2011-06-27 NOTE — Progress Notes (Signed)
Call to Dr. Stefano Gaul w/xray results. Orders received.

## 2011-06-27 NOTE — Progress Notes (Signed)
Patient has been consistently nauseated since 2300.   She was given IV zophran with minimal relief.  Abdomen appears to be more distended than her previous exam.  LLQ and RLQ soft, RUQ and LUQ more firm but still soft.  Notified midwife on call of findings.

## 2011-06-27 NOTE — Progress Notes (Signed)
Patient has not been able to ambulate during the shift related to nausea and weakness.  Patient was able to dangle and stand at the bedside this morning.  She has also been helped to reposition various times throughout the shift by the RN and NT.  SCD's in place.

## 2011-06-27 NOTE — Progress Notes (Addendum)
Subjective: Postpartum Day 3: Cesarean Delivery Exploratory laparotomy day 2  Patient reports nausea and vomiting.    Objective: Vital signs in last 24 hours: Temp:  [98.1 F (36.7 C)-99.5 F (37.5 C)] 98.8 F (37.1 C) (06/01 0601) Pulse Rate:  [85-114] 103  (06/01 0601) Resp:  [16-21] 20  (06/01 0601) BP: (102-130)/(39-86) 130/86 mmHg (06/01 0601) SpO2:  [96 %-100 %] 98 % (06/01 0601)  Physical Exam:  General: alert, cooperative and Still does not feel well Chest: Clear Heart: Regular rate and rhythm Abdomen: Distended, nontender, bowel sounds normal Serosanguineous drainage from the intra-abdominal drain Lochia: appropriate Uterine Fundus: firm   Basename 06/26/11 0812 06/26/11 0405  HGB 7.2* 7.4*  HCT 21.6* 22.2*    Assessment/Plan: Status post Cesarean section.  Status post exploratory laparotomy for postoperative bleeding of uncertain pathology. The patient's physical exam seems improved from yesterday.  The patient has an early ileus. We will make her n.p.o. Continue pain medication, antibiotics, and anti-emetics. We will consider an NG tube.  Janine Limbo 06/27/2011, 6:49 AM

## 2011-06-27 NOTE — Progress Notes (Signed)
Patient still verbalizes that she has continuous nausea with no relief.  Last given Reglan at 0305.  She vomited x 1 in the amount of 20ml, with a green color.  JP drain also has increased output recently.  First emptied , after emptying and returning back to the bedside, there was another in the JP drain.  After the drain, it continues to fill in a short amount of time.  Dr. Stefano Gaul notified of above findings and a new order was received to make pt NPO.  VSS and will continue to monitor patient.

## 2011-06-28 ENCOUNTER — Inpatient Hospital Stay (HOSPITAL_COMMUNITY): Payer: Medicaid Other

## 2011-06-28 LAB — CBC
HCT: 30.4 % — ABNORMAL LOW (ref 36.0–46.0)
Hemoglobin: 9.9 g/dL — ABNORMAL LOW (ref 12.0–15.0)
MCH: 29.7 pg (ref 26.0–34.0)
MCHC: 32.6 g/dL (ref 30.0–36.0)
MCV: 91.3 fL (ref 78.0–100.0)
Platelets: 202 10*3/uL (ref 150–400)
RBC: 3.33 MIL/uL — ABNORMAL LOW (ref 3.87–5.11)
RDW: 16.4 % — ABNORMAL HIGH (ref 11.5–15.5)
WBC: 8.7 10*3/uL (ref 4.0–10.5)

## 2011-06-28 LAB — BASIC METABOLIC PANEL
BUN: 7 mg/dL (ref 6–23)
CO2: 26 mEq/L (ref 19–32)
Calcium: 8.2 mg/dL — ABNORMAL LOW (ref 8.4–10.5)
Chloride: 105 mEq/L (ref 96–112)
Creatinine, Ser: 0.64 mg/dL (ref 0.50–1.10)
GFR calc Af Amer: >90 mL/min (ref >90)
GFR calc non Af Amer: >90 mL/min (ref >90)
Glucose, Bld: 106 mg/dL — ABNORMAL HIGH (ref 70–99)
Potassium: 3.3 mEq/L — ABNORMAL LOW (ref 3.5–5.1)
Sodium: 138 mEq/L (ref 135–145)

## 2011-06-28 MED ORDER — POTASSIUM CHLORIDE 2 MEQ/ML IV SOLN
INTRAVENOUS | Status: DC
  Administered 2011-06-28 – 2011-06-29 (×4): via INTRAVENOUS
  Filled 2011-06-28 (×7): qty 1000

## 2011-06-28 NOTE — Progress Notes (Signed)
Cesarean Section day 4 Exploratory Laparotomy day 3 Unasyn day 3  BP 127/83  Pulse 73  Temp(Src) 100 F (37.8 C) (Oral)  Resp 17  Ht 5\' 3"  (1.6 m)  Wt 83.734 kg (184 lb 9.6 oz)  BMI 32.70 kg/m2  SpO2 100%  LMP 09/15/2010  Abd X Ray: Improved air/fluid levels, consistent with an ileus.  Plan:  Repeat CBC and BMP in the morning. Continue NG suction. Allow ice chips.  Leonard Schwartz, M.D.

## 2011-06-28 NOTE — Progress Notes (Signed)
Cesarean section day 4 Exploratory laparotomy date 3  Subjective:  The patient indicates that her throat is sore from her NG tube. Her pain is better than before. She has not passed gas.  Objective:  BP 133/81  Pulse 80  Temp(Src) 98.6 F (37 C) (Oral)  Resp 20  Ht 5\' 3"  (1.6 m)  Wt 83.734 kg (184 lb 9.6 oz)  BMI 32.70 kg/m2  SpO2 97%  LMP 09/15/2010  CBC    Component Value Date/Time   WBC 8.7 06/28/2011 0540   RBC 3.33* 06/28/2011 0540   HGB 9.9* 06/28/2011 0540   HCT 30.4* 06/28/2011 0540   PLT 202 06/28/2011 0540   MCV 91.3 06/28/2011 0540   MCH 29.7 06/28/2011 0540   MCHC 32.6 06/28/2011 0540   RDW 16.4* 06/28/2011 0540   LYMPHSABS 2.2 12/09/2010 0823   MONOABS 0.5 12/09/2010 0823   EOSABS 0.1 12/09/2010 0823   BASOSABS 0.0 12/09/2010 0823    BMET    Component Value Date/Time   NA 138 06/28/2011 0540   K 3.3* 06/28/2011 0540   CL 105 06/28/2011 0540   CO2 26 06/28/2011 0540   GLUCOSE 106* 06/28/2011 0540   BUN 7 06/28/2011 0540   CREATININE 0.64 06/28/2011 0540   CALCIUM 8.2* 06/28/2011 0540   GFRNONAA >90 06/28/2011 0540   GFRAA >90 06/28/2011 0540   Chest: Clear  Heart: Regular rate and rhythm  Abdomen: Distended, bowel sounds normal, appropriately tender  Dressing: Stain on the dressing is stable in size  Lochia: Appropriate  Extremities: No signs of DVT  I/O:   Intake/Output Summary (Last 24 hours) at 06/28/11 0940 Last data filed at 06/28/11 0600  Gross per 24 hour  Intake 3991.92 ml  Output   2490 ml  Net 1501.92 ml    Assessment:  Postoperative ileus appears stable to slightly improved.  Chorioamnionitis is improved.  Anemia is stable.  The patient is anxious and seems to be doing better on Ativan.  Hypokalemia due to NG drainage.  Plan:  Repeat abdominal x-ray today to assess the distention of the bowel. If the patient is improving and we will continue her current course. If she is not improving then will consult general surgery.  Continue IV  antibiotics for now.  Replace potassium.  Repeat CBC and BMP in the morning.  Mylinda Latina.D.

## 2011-06-29 LAB — CBC
HCT: 30 % — ABNORMAL LOW (ref 36.0–46.0)
Hemoglobin: 9.9 g/dL — ABNORMAL LOW (ref 12.0–15.0)
MCH: 29.7 pg (ref 26.0–34.0)
MCHC: 33 g/dL (ref 30.0–36.0)
MCV: 90.1 fL (ref 78.0–100.0)
Platelets: 179 10*3/uL (ref 150–400)
RBC: 3.33 MIL/uL — ABNORMAL LOW (ref 3.87–5.11)
RDW: 15.6 % — ABNORMAL HIGH (ref 11.5–15.5)
WBC: 8.8 10*3/uL (ref 4.0–10.5)

## 2011-06-29 LAB — BASIC METABOLIC PANEL
BUN: 4 mg/dL — ABNORMAL LOW (ref 6–23)
CO2: 21 mEq/L (ref 19–32)
Calcium: 8 mg/dL — ABNORMAL LOW (ref 8.4–10.5)
Chloride: 104 mEq/L (ref 96–112)
Creatinine, Ser: 0.59 mg/dL (ref 0.50–1.10)
GFR calc Af Amer: >90 mL/min (ref >90)
GFR calc non Af Amer: >90 mL/min (ref >90)
Glucose, Bld: 94 mg/dL (ref 70–99)
Potassium: 3.8 mEq/L (ref 3.5–5.1)
Sodium: 135 mEq/L (ref 135–145)

## 2011-06-29 MED ORDER — LACTATED RINGERS IV SOLN
INTRAVENOUS | Status: DC
  Administered 2011-06-29: 16:00:00 via INTRAVENOUS

## 2011-06-29 MED ORDER — AMOXICILLIN-POT CLAVULANATE 875-125 MG PO TABS
1.0000 | ORAL_TABLET | Freq: Two times a day (BID) | ORAL | Status: DC
  Administered 2011-06-30 (×2): 1 via ORAL
  Filled 2011-06-29 (×4): qty 1

## 2011-06-29 NOTE — Progress Notes (Signed)
4 Days Post-Op Procedure(s) (LRB): EXPLORATORY LAPAROTOMY (N/A) Post-cesarean section #6  Subjective: Patient reports that pain is well managed. Fells has more energy today. No flatus. No nausea / vomiting. Has hunger pains.Ambulating.   Objective: BP 130/85  Pulse 66  Temp(Src) 99 F (37.2 C) (Oral)  Resp 18  Ht 5\' 3"  (1.6 m)  Wt 184 lb 9.6 oz (83.734 kg)  BMI 32.70 kg/m2  SpO2 100%  LMP 09/15/2010 Lungs: clear Heart: normal rate and rhythm Abdomen:soft and appropriately tender. Non-distended. BS present in all quadrants Extremities: Homans sign is negative, no sign of DVT Incision: covered  Assessment: s/p Procedure(s): EXPLORATORY LAPAROTOMY: ileus resolving?  Plan: Clamp NG tube. If well-tolerated, will advanc to liquid diet.  D/C Foley and encourage ambulation.  LOS: 5 days    Nisha Dhami A 06/29/2011, 1:44 PM

## 2011-06-29 NOTE — Plan of Care (Signed)
Problem: Discharge Progression Outcomes Goal: Barriers To Progression Addressed/Resolved Outcome: Progressing NG was clamped today at 1345 and catheter out.

## 2011-06-29 NOTE — Progress Notes (Signed)
4 Days Post-Op Procedure(s) (LRB): EXPLORATORY LAPAROTOMY (N/A)  Subjective: Patient reports that pain is well managed.  Tolerating clear liquids  diet without difficulty. No nausea / vomiting.  Ambulating and voiding.  Objective: BP 137/88  Pulse 71  Temp(Src) 98.9 F (37.2 C) (Oral)  Resp 16  Ht 5\' 3"  (1.6 m)  Wt 184 lb 9.6 oz (83.734 kg)  BMI 32.70 kg/m2  SpO2 100%  LMP 09/15/2010 Lungs: clear Heart: normal rate and rhythm Abdomen:soft and appropriately tender.Normal bowel sounds. Extremities: Homans sign is negative, no sign of DVT Incision: covered  Assessment: s/p Procedure(s): EXPLORATORY LAPAROTOMY: progressing well  Plan: D/C NG tube and advance diet  LOS: 5 days    Lalani Winkles A 06/29/2011, 8:26 PM

## 2011-06-30 MED ORDER — BISACODYL 10 MG RE SUPP
10.0000 mg | Freq: Every day | RECTAL | Status: DC | PRN
  Administered 2011-06-30: 10 mg via RECTAL
  Filled 2011-06-30: qty 1

## 2011-06-30 MED ORDER — PROMETHAZINE HCL 25 MG PO TABS
25.0000 mg | ORAL_TABLET | Freq: Four times a day (QID) | ORAL | Status: DC | PRN
  Administered 2011-07-02: 25 mg via ORAL
  Filled 2011-06-30: qty 1

## 2011-06-30 MED ORDER — METOCLOPRAMIDE HCL 5 MG/ML IJ SOLN
10.0000 mg | Freq: Four times a day (QID) | INTRAMUSCULAR | Status: AC
  Administered 2011-06-30 – 2011-07-01 (×4): 10 mg via INTRAVENOUS
  Filled 2011-06-30 (×2): qty 2

## 2011-06-30 MED ORDER — DIPHENHYDRAMINE HCL 50 MG/ML IJ SOLN: 25.0000 mg | Freq: Every evening | INTRAMUSCULAR | Status: DC | PRN

## 2011-06-30 MED ORDER — LACTATED RINGERS IV SOLN
INTRAVENOUS | Status: DC
  Administered 2011-06-30 – 2011-07-01 (×4): via INTRAVENOUS

## 2011-06-30 MED ORDER — SODIUM CHLORIDE 0.9 % IV SOLN
3.0000 g | Freq: Four times a day (QID) | INTRAVENOUS | Status: DC
  Administered 2011-06-30 – 2011-07-02 (×8): 3 g via INTRAVENOUS
  Filled 2011-06-30 (×10): qty 3

## 2011-06-30 MED ORDER — ACETAMINOPHEN 500 MG PO TABS
1000.0000 mg | ORAL_TABLET | Freq: Four times a day (QID) | ORAL | Status: DC | PRN
  Administered 2011-07-03: 1000 mg via ORAL
  Filled 2011-06-30 (×2): qty 2

## 2011-06-30 NOTE — Progress Notes (Addendum)
Subjective: Postpartum Day 5: Cesarean Delivery on 06/25/11--repeat C/S, presented in labor. Post-operative  Day 4 from laparotomy Patient reports nausea after eating this am, but no vomiting.  NG tube removed last night.  Ate small amount regular diet this am.  No dizziness or syncope now, passing small amounts of gas, but still feels very bloated with significant gas pain.  Up to bathe and care for self without issue.  Baby breastfeeding well.  Family at bedside.  Objective: Vital signs in last 24 hours: Temp:  [98.8 F (37.1 C)-99.3 F (37.4 C)] 99.3 F (37.4 C) (06/04 0558) Pulse Rate:  [66-79] 78  (06/04 0558) Resp:  [16-18] 18  (06/04 0558) BP: (118-148)/(75-91) 148/91 mmHg (06/04 0558) SpO2:  [99 %-100 %] 99 % (06/04 0212)  Physical Exam:  General: alert, in mild distress when gas pain occurs Lochia: appropriate Uterine Fundus: firm Incision: healing well DVT Evaluation: No evidence of DVT seen on physical exam. Negative Homan's sign. JP intra-abdominal drain with 315 cc last 24 hours, emptied of 80 cc this am so far, serosanguinous drainage. Bowel sounds very active in all quadrants, with upper abdominal tympanic distension, but only mild tenderness at present.  Basename 06/29/11 0547 06/28/11 0540  HGB 9.9* 9.9*  HCT 30.0* 30.4*    Assessment/Plan: Status post Cesarean section.  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 Su Hilt Will change diet back to clear liquids at present. Dulcolax suppository today. Observe status--if gaseous issue improves today, may be able to restart regular diet tonight--otherwise, will stay on clear liquids till am, and then re-evaluate.  Nigel Bridgeman, CNM 06/30/2011, 11:53 AM  Agree with above.  Mostly serous drainage in JP drain. - AYR 5:30p Called by RN.  Pt with  emesis 250cc.  Make NPO, start IV fluids, change po amoxicillin to IV unasyn, trial of reglan.  Pt is s/p trial of dulcolax supp at about 1:30p with small amount of loose/watery stool.  Will continue to observe. 7:00p Pt seen and requesting something to help her sleep.  Benadryl IV ordered.  She said she is just tired and restless.  Tmax 99.5 BP 149/91.  No headache or visual changes.  Pt's abdomen is soft with active bowel sounds (?hyperactive), soft, mildly tender mostly in the RUQ where bowel is clearly palpable, no rebound, slight guarding in RUQ and ND.  Will continue to keep NPO overnight and reeval in am.  Labs in AM as well. - AYR

## 2011-07-01 ENCOUNTER — Inpatient Hospital Stay (HOSPITAL_COMMUNITY): Payer: Medicaid Other

## 2011-07-01 LAB — CBC
HCT: 31.9 % — ABNORMAL LOW (ref 36.0–46.0)
Hemoglobin: 10.6 g/dL — ABNORMAL LOW (ref 12.0–15.0)
MCH: 30.1 pg (ref 26.0–34.0)
MCHC: 33.2 g/dL (ref 30.0–36.0)
MCV: 90.6 fL (ref 78.0–100.0)
Platelets: 341 10*3/uL (ref 150–400)
RBC: 3.52 MIL/uL — ABNORMAL LOW (ref 3.87–5.11)
RDW: 14.6 % (ref 11.5–15.5)
WBC: 10.3 10*3/uL (ref 4.0–10.5)

## 2011-07-01 LAB — COMPREHENSIVE METABOLIC PANEL
ALT: 16 U/L (ref 0–35)
AST: 20 U/L (ref 0–37)
Albumin: 2.5 g/dL — ABNORMAL LOW (ref 3.5–5.2)
Alkaline Phosphatase: 73 U/L (ref 39–117)
BUN: 8 mg/dL (ref 6–23)
CO2: 23 mEq/L (ref 19–32)
Calcium: 8.7 mg/dL (ref 8.4–10.5)
Chloride: 104 mEq/L (ref 96–112)
Creatinine, Ser: 0.78 mg/dL (ref 0.50–1.10)
GFR calc Af Amer: >90 mL/min (ref >90)
GFR calc non Af Amer: >90 mL/min (ref >90)
Glucose, Bld: 69 mg/dL — ABNORMAL LOW (ref 70–99)
Potassium: 4 mEq/L (ref 3.5–5.1)
Sodium: 138 mEq/L (ref 135–145)
Total Bilirubin: 0.4 mg/dL (ref 0.3–1.2)
Total Protein: 5.8 g/dL — ABNORMAL LOW (ref 6.0–8.3)

## 2011-07-01 LAB — LACTATE DEHYDROGENASE: LDH: 256 U/L — ABNORMAL HIGH (ref 94–250)

## 2011-07-01 LAB — URIC ACID: Uric Acid, Serum: 7.4 mg/dL — ABNORMAL HIGH (ref 2.4–7.0)

## 2011-07-01 MED ORDER — MORPHINE SULFATE 4 MG/ML IJ SOLN
2.0000 mg | INTRAMUSCULAR | Status: DC | PRN
  Administered 2011-07-01 – 2011-07-02 (×4): 2 mg via INTRAVENOUS
  Filled 2011-07-01 (×4): qty 1

## 2011-07-01 MED ORDER — METOCLOPRAMIDE HCL 5 MG/ML IJ SOLN
10.0000 mg | Freq: Four times a day (QID) | INTRAMUSCULAR | Status: DC
  Administered 2011-07-01 – 2011-07-02 (×4): 10 mg via INTRAVENOUS
  Filled 2011-07-01 (×4): qty 2

## 2011-07-01 NOTE — Progress Notes (Addendum)
Subjective:   Postpartum Day 7: Cesarean Delivery. Exploratory laparotomy day 6.Unasyn day 6.   Feels better than this morning. Passing a small amount of flatus. Nausea continues.   Objective:   BP 148/87  Pulse 76  Temp(Src) 99.7 F (37.6 C) (Oral)  Resp 19  Ht 5\' 3"  (1.6 m)  Wt 83.734 kg (184 lb 9.6 oz)  BMI 32.70 kg/m2  SpO2 98%  LMP 09/15/2010  Physical Exam:  General: alert and cooperative  Lochia: appropriate  Uterine Fundus: firm  Abdomen: Soft, bowel sounds positive, appropriately tender, no masses appreciated  Incision: healing well  DVT Evaluation: No evidence of DVT seen on physical exam.  Chest: Clear  Heart: Regular rate and rhythm   Basename  06/29/11 0547   HGB  9.9*   HCT  30.0*    CBC    Component Value Date/Time   WBC 10.3 07/01/2011 1520   RBC 3.52* 07/01/2011 1520   HGB 10.6* 07/01/2011 1520   HCT 31.9* 07/01/2011 1520   PLT 341 07/01/2011 1520   MCV 90.6 07/01/2011 1520   MCH 30.1 07/01/2011 1520   MCHC 33.2 07/01/2011 1520   RDW 14.6 07/01/2011 1520   LYMPHSABS 2.2 12/09/2010 0823   MONOABS 0.5 12/09/2010 0823   EOSABS 0.1 12/09/2010 0823   BASOSABS 0.0 12/09/2010 0823   CMP     Component Value Date/Time   NA 138 07/01/2011 1520   K 4.0 07/01/2011 1520   CL 104 07/01/2011 1520   CO2 23 07/01/2011 1520   GLUCOSE 69* 07/01/2011 1520   BUN 8 07/01/2011 1520   CREATININE 0.78 07/01/2011 1520   CALCIUM 8.7 07/01/2011 1520   PROT 5.8* 07/01/2011 1520   ALBUMIN 2.5* 07/01/2011 1520   AST 20 07/01/2011 1520   ALT 16 07/01/2011 1520   ALKPHOS 73 07/01/2011 1520   BILITOT 0.4 07/01/2011 1520   GFRNONAA >90 07/01/2011 1520   GFRAA >90 07/01/2011 1520    Abdominal x-ray: Improved ileus pattern. No signs of obstruction.  Assessment/Plan:   Status post Cesarean section and exploratory laparotomy.  Blood pressure values have improved.  The patient agrees to a soapsuds enema. We'll begin clear liquids in the morning and then quickly advanced to a regular diet if  tolerated.  Gevin Perea V  07/01/2011, 10:43 AM

## 2011-07-01 NOTE — Progress Notes (Signed)
Subjective: Postpartum Day 7: Cesarean Delivery. Exploratory laparotomy day 6.Unasyn day 6. Patient reports nausea, incisional pain, + BM and no problems voiding.   The patient denies headaches, blurred vision, and right upper quadrant tenderness.  Objective: Vital signs in last 24 hours: Temp:  [99 F (37.2 C)-99.5 F (37.5 C)] 99.5 F (37.5 C) (06/05 1025) Pulse Rate:  [67-78] 73  (06/05 1025) Resp:  [16-20] 20  (06/05 1025) BP: (128-158)/(76-105) 158/98 mmHg (06/05 1025) SpO2:  [97 %-98 %] 98 % (06/05 0410)  Physical Exam:  General: alert and cooperative Lochia: appropriate Uterine Fundus: firm Abdomen: Soft, bowel sounds positive, appropriately tender, no masses appreciated Incision: healing well DVT Evaluation: No evidence of DVT seen on physical exam. Chest: Clear Heart: Regular rate and rhythm   Basename 06/29/11 0547  HGB 9.9*  HCT 30.0*    Assessment/Plan: Status post Cesarean section and exploratory laparotomy.  Blood pressure values have improved.  The patient continues to have nausea this morning. She has not had vomiting. She has improved clinically since I last saw her 3 days ago. I am concerned that her nausea and and vomiting returned yesterday. We will repeat abdominal x-ray today. I do not think she has a small bowel obstruction. She will remain n.p.o. for now.  Currently there are no signs of preeclampsia. I will check [facility] labs today. They have had difficulty drawing her blood to this point.  Makalyn Lennox V 07/01/2011, 10:43 AM

## 2011-07-01 NOTE — Plan of Care (Signed)
Problem: Discharge Progression Outcomes Goal: Barriers To Progression Addressed/Resolved Outcome: Progressing 5/30  Exp lap 6/3  developed ileus Goal: Tolerating diet Outcome: Not Met (add Reason) Currently NPO

## 2011-07-01 NOTE — Plan of Care (Signed)
Problem: Phase II Progression Outcomes Goal: Incision intact & without signs/symptoms of infection Outcome: Progressing Steri strips on incision wet with serous drainage. Al Pimple drain still in and draining.  Problem: Discharge Progression Outcomes Goal: Barriers To Progression Addressed/Resolved Outcome: Not Progressing Pt made NPO again today for vomiting and nausea. IV restarted and IV antibiotics begun. States tonight she has stopped passing gas but had small liquid bowel movement after suppository this afternoon.

## 2011-07-02 MED ORDER — METOCLOPRAMIDE HCL 10 MG PO TABS
10.0000 mg | ORAL_TABLET | Freq: Four times a day (QID) | ORAL | Status: DC
  Administered 2011-07-02 – 2011-07-03 (×3): 10 mg via ORAL
  Filled 2011-07-02 (×3): qty 1

## 2011-07-02 NOTE — Progress Notes (Addendum)
Subjective: Postpartum Day 8: Cesarean Delivery repeat on 5/29, exploratory laparotomy on 5/30. Patient reports feeling better today, but still gassy.  Had SSE yesterday (able to tolerate 500 cc), with moderate results--asks if that can be repeated.  No nausea or vomiting.  Up to BR without dizziness or syncope.  Wants to shower.  Breast feeding going well--baby has been officially d/c'd, but is remaining with patient with family caring for infant in mom's room.  Objective: Vital signs in last 24 hours: Temp:  [98.6 F (37 C)-99.7 F (37.6 C)] 98.6 F (37 C) (06/06 0620) Pulse Rate:  [73-77] 73  (06/06 0620) Resp:  [18-20] 20  (06/06 0620) BP: (137-149)/(68-87) 137/86 mmHg (06/06 0620) SpO2:  [98 %] 98 % (06/06 0620)  Physical Exam:  General: alert Lochia: appropriate Uterine Fundus: firm Incision: healing well, steristrips in place. DVT Evaluation: No evidence of DVT seen on physical exam. Negative Homan's sign. Abdomen--positive bowel sounds in all quadrants, some tympanic distension, but soft.  No rebound or guarding. JP intra-abdominal drain--190 cc serosanguinous drainage   Basename 07/01/11 1520  HGB 10.6*  HCT 31.9*   6/5 Uric acid 7.4, LDH 256.  ATB administration: Ancef 2 gm IV on 5/29 Unasyn IV 5/31--6/3 Augmentin 6/3--6/4 Unasyn 6/4--current  Assessment/Plan: Status post Cesarean section day 8 and s/p exploratory laparotomy day 7. Resolving ileus S/P transfusion for post-op intra-abdominal bleeding--hemodynamically stable Sporadic BP elevations--stable now  Plan: Consulted with Dr. Normand Sloop. May repeat SSE. Advance to clear liquid diet. Continue IV Unasyn through tomorrow (will complete full 7 days of ATB therapy). MDs will follow. Patient may shower  Addendum: Per consult with Dr. Normand Sloop, plan repeat [facility] labs tomorrow .  [name], CNM, [place] 07/02/2011, 11:02 AM

## 2011-07-02 NOTE — Progress Notes (Signed)
UR chart review completed.  

## 2011-07-03 DIAGNOSIS — K9189 Other postprocedural complications and disorders of digestive system: Secondary | ICD-10-CM | POA: Diagnosis not present

## 2011-07-03 DIAGNOSIS — K567 Ileus, unspecified: Secondary | ICD-10-CM | POA: Diagnosis not present

## 2011-07-03 LAB — COMPREHENSIVE METABOLIC PANEL
ALT: 13 U/L (ref 0–35)
AST: 15 U/L (ref 0–37)
Albumin: 2.6 g/dL — ABNORMAL LOW (ref 3.5–5.2)
Alkaline Phosphatase: 76 U/L (ref 39–117)
BUN: 10 mg/dL (ref 6–23)
CO2: 21 mEq/L (ref 19–32)
Calcium: 8.7 mg/dL (ref 8.4–10.5)
Chloride: 102 mEq/L (ref 96–112)
Creatinine, Ser: 0.78 mg/dL (ref 0.50–1.10)
GFR calc Af Amer: >90 mL/min (ref >90)
GFR calc non Af Amer: >90 mL/min (ref >90)
Glucose, Bld: 57 mg/dL — ABNORMAL LOW (ref 70–99)
Potassium: 4.1 mEq/L (ref 3.5–5.1)
Sodium: 137 mEq/L (ref 135–145)
Total Bilirubin: 0.4 mg/dL (ref 0.3–1.2)
Total Protein: 5.9 g/dL — ABNORMAL LOW (ref 6.0–8.3)

## 2011-07-03 LAB — CBC
HCT: 32.5 % — ABNORMAL LOW (ref 36.0–46.0)
Hemoglobin: 10.7 g/dL — ABNORMAL LOW (ref 12.0–15.0)
MCH: 30 pg (ref 26.0–34.0)
MCHC: 32.9 g/dL (ref 30.0–36.0)
MCV: 91 fL (ref 78.0–100.0)
Platelets: 419 10*3/uL — ABNORMAL HIGH (ref 150–400)
RBC: 3.57 MIL/uL — ABNORMAL LOW (ref 3.87–5.11)
RDW: 14.5 % (ref 11.5–15.5)
WBC: 9.6 10*3/uL (ref 4.0–10.5)

## 2011-07-03 LAB — URIC ACID: Uric Acid, Serum: 10.9 mg/dL — ABNORMAL HIGH (ref 2.4–7.0)

## 2011-07-03 LAB — LACTATE DEHYDROGENASE: LDH: 282 U/L — ABNORMAL HIGH (ref 94–250)

## 2011-07-03 NOTE — Progress Notes (Signed)
Subjective: Postpartum Day 9: Cesarean Delivery, repeat on 5/29; exploratory laparotomy 5/30 for intra-abdominal bleeding, post-op ileus  Patient reports receiving nausea medication this am, when she thought she asked for pain med--"nausea medication made me sick"--vomited small amount (received Reglan at 5:49a).  Had good results from [facility] yesterday.  Passing some gas today.  Reports nausea still present, but mild.  Has taken clear liquids since without vomiting.  Denies HA, visual symptoms, or epigastric pain.  No syncope or dizziness, ambulates without assistance.  Mother staying with patient and caring for baby rooming-in.  Minimal surgical site pain.  Currently still on clear liquids. Antibiotics d/c'd yesterday s/p IV infiltration  Objective: Vital signs in last 24 hours: Temp:  [98.3 F (36.8 C)-98.9 F (37.2 C)] 98.3 F (36.8 C) (06/07 0629) Pulse Rate:  [69-78] 78  (06/07 0629) Resp:  [18] 18  (06/07 0629) BP: (139-142)/(84-88) 139/86 mmHg (06/07 0629)  Filed Vitals:   07/02/11 0620 07/02/11 1440 07/02/11 2209 07/03/11 0629  BP: 137/86 139/84 142/88 139/86  Pulse: 73 75 69 78  Temp: 98.6 F (37 C) 98.5 F (36.9 C) 98.9 F (37.2 C) 98.3 F (36.8 C)  TempSrc: Oral Oral Oral Oral  Resp: 20 18 18 18   Height:      Weight:      SpO2: 98%        Physical Exam:  General: alert Lochia: appropriate Uterine Fundus: firm Incision: healing well, steri-strips in place. DVT Evaluation: No evidence of DVT seen on physical exam. Negative Homan's sign. Ext DTR 2+ without clonus, 1+ edema JP intra-abdominal drain = 40 cc last 24 hours, normal appearance of  drainage Abdomen--mild tympanic distension, positive bowel sounds in all quadrants  Basename 07/03/11 0535 07/01/11 1520  HGB 10.7* 10.6*  HCT 32.5* 31.9*    Results for orders placed during the hospital encounter of 06/24/11 (from the past 24 hour(s))  CBC     Status: Abnormal   Collection Time   07/03/11  5:35 AM   Component Value Range   WBC 9.6  4.0 - 10.5 (K/uL)   RBC 3.57 (*) 3.87 - 5.11 (MIL/uL)   Hemoglobin 10.7 (*) 12.0 - 15.0 (g/dL)   HCT 40.9 (*) 81.1 - 46.0 (%)   MCV 91.0  78.0 - 100.0 (fL)   MCH 30.0  26.0 - 34.0 (pg)   MCHC 32.9  30.0 - 36.0 (g/dL)   RDW 91.4  78.2 - 95.6 (%)   Platelets 419 (*) 150 - 400 (K/uL)  COMPREHENSIVE METABOLIC PANEL     Status: Abnormal   Collection Time   07/03/11  5:35 AM      Component Value Range   Sodium 137  135 - 145 (mEq/L)   Potassium 4.1  3.5 - 5.1 (mEq/L)   Chloride 102  96 - 112 (mEq/L)   CO2 21  19 - 32 (mEq/L)   Glucose, Bld 57 (*) 70 - 99 (mg/dL)   BUN 10  6 - 23 (mg/dL)   Creatinine, Ser 2.13  0.50 - 1.10 (mg/dL)   Calcium 8.7  8.4 - 08.6 (mg/dL)   Total Protein 5.9 (*) 6.0 - 8.3 (g/dL)   Albumin 2.6 (*) 3.5 - 5.2 (g/dL)   AST 15  0 - 37 (U/L)   ALT 13  0 - 35 (U/L)   Alkaline Phosphatase 76  39 - 117 (U/L)   Total Bilirubin 0.4  0.3 - 1.2 (mg/dL)   GFR calc non Af Amer >90  >90 (mL/min)  GFR calc Af Amer >90  >90 (mL/min)  URIC ACID     Status: Abnormal   Collection Time   07/03/11  5:35 AM      Component Value Range   Uric Acid, Serum 10.9 (*) 2.4 - 7.0 (mg/dL)  LACTATE DEHYDROGENASE     Status: Abnormal   Collection Time   07/03/11  5:35 AM      Component Value Range   LDH 282 (*) 94 - 250 (U/L)   Labs on 07/02/11:   LDH 256 Uric acid 7.4  Assessment/Plan: Status post Cesarean section day 9, exploratory lap day 8. Post-op ileus Mild elevation of BP Elevated LDH and uric acid  Plan: Reviewed lab findings with patient and mother. Advised them MDs will see and evaluate for plan of care. Support to patient for extended hospitalization.  Nigel Bridgeman, CNM, [place] 07/03/2011, 9:01 AM

## 2011-07-04 ENCOUNTER — Encounter (HOSPITAL_COMMUNITY): Payer: Self-pay

## 2011-07-04 DIAGNOSIS — O165 Unspecified maternal hypertension, complicating the puerperium: Secondary | ICD-10-CM | POA: Diagnosis not present

## 2011-07-04 LAB — PROTEIN, URINE, 24 HOUR
Collection Interval-UPROT: 24 hours
Protein, 24H Urine: 219 mg/d — ABNORMAL HIGH (ref 50–100)
Protein, Urine: 23 mg/dL
Urine Total Volume-UPROT: 950 mL

## 2011-07-04 MED ORDER — ACETAMINOPHEN 500 MG/5ML PO LIQD: 1000.0000 mg | Freq: Four times a day (QID) | ORAL | Status: DC | PRN

## 2011-07-04 MED ORDER — POLYETHYLENE GLYCOL 3350 17 GM/SCOOP PO POWD: 17.0000 g | Freq: Two times a day (BID) | ORAL | Status: AC | PRN

## 2011-07-04 MED ORDER — IBUPROFEN 100 MG/5ML PO SUSP: 600.0000 mg | Freq: Four times a day (QID) | ORAL | Status: DC | PRN

## 2011-07-04 MED ORDER — OXYCODONE HCL 5 MG/5ML PO SOLN: 5.0000 mg | ORAL | Status: DC | PRN

## 2011-07-04 NOTE — Discharge Instructions (Addendum)
Cesarean Delivery Care After Refer to this sheet in the next few weeks. These instructions provide you with information on caring for yourself after your procedure. Your caregiver may also give you more specific instructions. Your treatment has been planned according to current medical practices, but problems sometimes occur. Call your caregiver if you have any problems or questions after your procedure. HOME CARE INSTRUCTIONS Healing will take time. You will have discomfort, tenderness, swelling, and bruising at the surgery site for a couple of weeks. This is normal and will get better as time goes on. Activity  Rest as much as possible the first 2 weeks.   When possible, have someone help you with your household activities and your baby for 2 to 3 weeks.   Limit your housework and social activity. Increase your activity gradually as your strength returns.   Do not climb stairs more than 2 to 3 times a day.   Do not lift anything heavier than your baby.   Follow your caregiver's instructions about driving a car.   Exercise only as directed by your caregiver.  Nutrition  You may return to your usual diet. Eat a well-balanced diet.   Drink enough fluid to keep your urine clear or pale yellow.   Keep taking your prenatal or multivitamins.   Do not drink alcohol until your caregiver says it is okay.  Elimination You should return to your usual bowel function. If you develop constipation, ask your caregiver about taking a mild laxative that will help you go to the bathroom. Bran foods and fluids help with constipation. Gradually add fruit, vegetables, and bran to your diet.  Hygiene  You may shower, wash your hair, and take tub baths unless your caregiver tells you otherwise.   Continue perineal care until your vaginal bleeding and discharge stops.   Do not douche or use tampons until your caregiver says it is okay.  Fever If you feel feverish or have shaking chills, take your  temperature. The fever may indicate infection. Infections can be treated with antibiotic medicine. Pain Control and Medicine  Only take over-the-counter or prescription medicine as directed by your caregiver. Do not take aspirin. It can cause bleeding.   Do not drive when taking pain medicine.   Talk to your caregiver about restarting or adjusting your normal medicines.  Incision Care  Clean your cut (incision) gently with soap and water, then pat dry.   If your caregiver says it is okay, leave the incision without a bandage (dressing) unless it is draining fluid or irritated.   If you have small adhesive strips across the incision and they do not fall off within 7 days, carefully peel them off.   Check the incision daily for increased redness, drainage, swelling, or separation of skin.   Hug a pillow when coughing or sneezing. This helps to relieve pain.  Vaginal Care You may have a vaginal discharge or bleeding for up to 6 weeks. If the vaginal discharge becomes bright red, bad smelling, heavy in amount, has blood clots, or if you have burning or frequent urination, call your caregiver.If your bleeding slows down and then gets heavier, your body is telling you to slow down and relax more. Sexual Intercourse  Check with your caregiver before resuming sexual activity. Often, after 4 to 6 weeks, if you feel good and are well rested, sexual activity may be resumed. Avoid positions that strain the incision site.   You can become pregnant before you have a period.   If you decide to have sexual intercourse, use birth control if you do not want to become pregnant right away.  Health Practices  Keep all your postpartum appointments as recommended by your caregiver. Generally, your caregiver will want to see you in 2 to 3 weeks.   Continue with your yearly pelvic exams.   Continue monthly self-breast exams and yearly physical exams with a Pap test.  Breast Care  If you are not  breastfeeding and your breasts become tender, hard, or leak milk, you may wear a tight-fitting bra and apply ice to your breasts.   If you are breastfeeding, wear a good support bra.   Call your caregiver if you have breast pain, flu-like symptoms, fever, or hardness and reddening of your breasts.  Postpartum Blues You may have a period of low spirits or "blues" after your baby is born. Discuss your feelings with your partner, family, and friends. This may be caused by the changing hormone levels in your body. You may want to contact your caregiver if this is worrisome. Miscellaneous  Limit wearing support panties or control-top hose.   If you breastfeed, you may not have a period for several months or longer. This is normal for the nursing mother. If you do not menstruate within 6 weeks after you stop breastfeeding, see your caregiver.   If you are not breastfeeding, you can expect to menstruate within 6 to 10 weeks after birth. If you have not started by the 11th week, check with your caregiver.  SEEK MEDICAL CARE IF:   There is swelling, redness, or increasing pain in the wound area.   You have pus coming from the wound.   You notice a bad smell from the wound or surgical dressing.   You have pain, redness, and swelling from the intravenous (IV) site.   Your wound breaks open (the edges are not staying together).   You feel dizzy or feel like fainting.   You develop pain or bleeding when you urinate.   You develop diarrhea.   You develop nausea and vomiting.   You develop abnormal vaginal discharge.   You develop a rash.   You have any type of abnormal reaction or develop an allergy to your medicine.   Your pain is not relieved by your medicine or becomes worse.   Your temperature is 101 F (38.3 C), or is 100.4 F (38 C) taken 2 times in a 4 hour period.  SEEK IMMEDIATE MEDICAL CARE:  You develop a temperature of 102 F (38.9 C) or higher.   You develop abdominal  pain.   You develop chest pain.   You develop shortness of breath.   You faint.   You develop pain, swelling, or redness of your leg.   You develop heavy vaginal bleeding with or without blood clots.  Document Released: 10/04/2001 Document Revised: 01/01/2011 Document Reviewed: 04/09/2010 [facility] Patient Information 2012 [place], [place]. Breastfeeding Challenges Breastfeeding is often the best way to feed your baby. Challenges may discourage you from breastfeeding. But solutions can usually be found to help you. Some babies have conditions that may interfere with or make breastfeeding more difficult. But, in all of the following cases, breastfeeding is still best for a baby's health.  ADVANTAGES OF BREASTFEEDING  Breastfed babies tend to be healthier and less affected by disease.   Breastfed babies may have better brain development and be less likely to be overweight than formulafed babies.   Breastfeeding also benefits the mother. It will  give you time to be close to your baby and helps create a strong bond. It also:   Delays the return of your periods.   Stimulates your uterus to contract back to normal.   Helps you lose some of the weight you gained during pregnancy.   Breastfeeding is also cheaper than formula feeding. It also does not require mixing formula, heating bottles, or washing extra dishes.   Breastfeeding mothers have a lower risk of developing breast cancer.   Breastfeeding should be encouraged in women with gestational diabetes and diabetes type I and type II.  BREASTFEEDING CHALLENGES  Breastfeeding involves taking the time to get to know your baby's patterns and responding to his or her cues. Once breastfeeding is well established, feedings usually become more regular and more widely spaced. Some mothers do not nurse their babies because they come across problems early on. If at all possible, begin breastfeeding your baby within an hour after delivery. The  first milk you produce is called colostrum. It is packed with nutrients and disease-fighting substances. These will help nourish and protect your baby against infections as he or she grows up.   Some babies are unable to breastfeed because of premature birth and small size along with weakness and difficulty sucking. Sometimes with birth defects of the mouth (cleft lip or cleft palate) the mother may be able to pump breastmilk to give to her baby. Some digestive problems (breast milk jaundice, galactosemia) may be reasons not to nurse. See a lactation consultant if you have a breast infection or breast abscess, breast cancer or other cancer, previous surgery or radiation treatment, or inadequate milk supply (uncommon).  SOME MOTHERS ARE ADVISED NOT TO BREASTFEED DUE TO HEALTH PROBLEMS SUCH AS:  Serious illnesses.   Severe malnutrition.   Undergoing radiation therapy.   Taking psychiatric medication.   Active herpes lesions on the breast.   Chickenpox or shingles.   Active, untreated tuberculosis.   HIV (human immunodeficiency virus) infection.   Drug or alcohol addiction.   Undergoing radioactive iodine therapy.   Leukemia human cell Type I or II.  BREASTFEEDING SHOULD NOT BE PAINFUL  It is natural for minor problems to arise in first time breastfeeding. Problems you may have and some solutions are as follows:   Nipple soreness may be caused by:   Improper position of baby.   Improper feeding techniques.   Improper nipple care.   For many women, there is no identified cause. A simple change in your baby's position while feeding may relieve nipple soreness. Some breastfeeding mothers report nipple soreness only during the initial adjustment period.   If there is tenderness at first, it should gradually go away as the days go by. Poor latch-on and positioning are common causes of sore nipples. This is usually because the baby is not getting enough of the areola into his or her  mouth, and is sucking mostly on the nipple. The areola is the colored portion around the nipple. In general, nurse early and often. Nurse with the nipple and areola in the baby's mouth, not just the nipple. And feed your baby on demand.   If you have sore nipples and put off feedings because of the pain, this can lead to your breasts becoming overly full. This may lead to plugged milk ducts in the breast followed by engorgement or even infection of the breasts. If your baby is latched on correctly, he or she should be able to nurse as long as needed  without causing any pain. If it hurts, take the baby off of your breast and try again. Ask for help if it is still painful for you.   Check the positioning of your baby's body and the way she latches on and sucks. To minimize soreness, your baby's mouth should be open wide with as much of the areola in his or her mouth as possible. You should find that it feels better right away once the baby is positioned correctly.   Do not delay feedings. Try to relax so your let-down reflex comes easily. You also can hand-express a little milk before beginning the feeding so your baby does not clamp down harder, waiting for the milk to come.   If your nipples are very sore, it may be helpful to change positions each time you nurse. This puts the pressure on a different part of the nipple.   After nursing, you can also express a few drops of milk and gently rub it on your nipples. Human milk has natural healing properties. Let your nipples air-dry after feeding, or wear a soft-cotton shirt.   Wearing a nipple shield during nursing will not relieve sore nipples. They actually can prolong soreness by making it hard for the baby to learn to nurse without the shield.   Avoid wearing bras or clothes that are too tight and put pressure on your nipples. If you wear a bra, get one that offers good support to your breasts.   Change nursing pads often to avoid trapping in  moisture. Only use cotton pads.   Avoid using soap, ointments or other chemicals on your nipples. Make sure to avoid products that must be removed before nursing. Washing with clean water is all that is necessary to keep your nipples and breasts clean.   Try rubbing pure lanolin on your nipples after breastfeeding to soothe the pain.   Making sure you get enough rest, eating healthy foods, and getting enough fluids also can help the healing process. If you have very sore nipples, you can ask your caregiver about using non-aspirin pain relievers.   Another cause of sore nursing is a condition called thrush. This is a fungal infection that can form on your nipples from the milk. Other signs of thrush include itching, flaking and drying skin, tender or pink skin. The infection also can form in the baby's mouth from having contact with your nipples. There it appears as little white spots on the inside of the cheeks, gums, or tongue. It also can appear as a diaper rash on your baby that will not go away by using regular diaper rash ointments. If you have any of these symptoms or think you have thrush, contact your doctor and your baby's doctor, or a Advertising copywriter. A lactation consultant is a breastfeeding Risk analyst. You can get medication for your nipples and for your baby.   If you still have sore nipples after following the above tips, you may need to see someone who is trained in breastfeeding, like a Advertising copywriter.  ENGORGEMENT Engorgement is a condition after pregnancy, when your breasts feel very hard and painful. You also may have breast swelling, tenderness, warmth, redness, throbbing and flattening of the nipple. Engorgement may cause a low-grade fever. This can be confused with a breast infection. Engorgement is the result of the milk building up. It usually happens during the third to fifth day after birth. This slows circulation. When blood and lymph move through the  breasts,  fluid from the blood vessels can seep into the breast tissues. All of the following can cause engorgement.  Poor positioning.   Infrequent feedings.   Giving supplementary bottles of water, juice, formula, or breast milk or using a pacifier. All of these cut down on your feeding and may lead to engorgement.   Changing the breastfeeding schedule with decreasing in feeding.   The baby changes the nursing pattern.   Having a baby with a weak suck who is not able to nurse effectively.   Fatigue, stress, or anemia in the mother.   An overabundant milk supply.   Nipple damage.   Breast abnormalities.  Engorgement can lead to plugged ducts or a breast infection. So it is important to try to prevent it before this happens. If treated properly, engorgement should only last for one to two days.  Minimize engorgement by making sure the baby is latched on and positioned correctly at your breast.   Nurse frequently after birth. Allow the baby to nurse as long as he/she likes, as long as he/she is latched on well and sucking effectively.   In the early days when your milk is coming in, you should awaken a sleepy baby every 2 to 3 hours to breastfeed. Breastfeeding often on the affected side helps to remove the milk and keeps milk moving freely. This prevents overfilling of the breast.   Avoid additional bottles and pacifiers.   Try hand expressing or pumping a little milk to first soften the breast, areola, and nipple before breastfeeding. Or massage the breast before feeding.   Cold compresses in between feedings can help ease pain and swelling.   If you are returning to work, try to pump your milk on the same schedule your baby was fed.   Eat a well balanced diet and drink plenty of fluids.   Use a well-fitting, supportive bra that is not too tight.   If your engorgement lasts for more than two days even after treating it, contact a Advertising copywriter.   Use a breast pump to  keep up with your nursing schedule.   Use a breast pump if your baby is not taking enough milk or you feel you may be getting engorged.  PLUGGED DUCTS AND INFECTION Plugged ducts and breast infection (mastitis) are common sources of sore breasts postpartum. It is common for many women to have a plugged duct in the breast at some point if breastfeeding.   A plugged milk duct feels like a tender, sore, lump in the breast. It is not accompanied by a fever or other symptoms. It happens when a milk duct does not properly drain. Then, pressure builds up behind the plug, and surrounding tissue becomes inflamed. A plugged duct usually only occurs in one breast at a time.   A breast infection (mastitis), on the other hand, is soreness or a lump in the breast that can be accompanied by a fever and/or flu-like symptoms. You may feel run down or very achy. Some women with a breast infection also have nausea and vomiting. You also may have yellowish discharge from the nipple that looks like colostrum. Or the breasts feel warm or hot to the touch and appear pink or red. A breast infection can occur when other family members have a cold or the flu. Like a plugged duct, it usually only occurs in one breast. It is not always easy to tell the difference between a breast infection and a plugged duct. Both have similar  symptoms and can improve within 24 to 48 hours.   Treatment for plugged ducts and breast infections is similar. But some breast infections need to also be treated with an antibiotic.   If mastitis is not treated quickly, it may lead to a breast abscess.   It may help to massage the area, starting behind the sore spot. Use your fingers in a circular motion and massage toward the nipple.   Breastfeed more often on the affected side. This helps loosen the plug, keeps the milk moving freely, and the breast from becoming overly full. Nursing every two hours, both day and night on the affected side first can be  helpful.   Getting extra sleep or relaxing with your feet up can help speed healing. Often a plugged duct or breast infection is the first sign that a mother is doing too much and becoming overly tired.   Wear a well-fitting supportive bra that is not too tight, since this can constrict milk ducts.   If you do not feel better within 24 hours of trying these steps, and you have a fever or your symptoms worsen, call your doctor. You may need an antibiotic. Also, if you have a breast infection in which both breasts look affected, or there is pus or blood in the milk, red streaks near the area, or your symptoms came on severely and suddenly, see your doctor right away.   Even if you need an antibiotic, continuing to breastfeed during treatment is best for both you and your baby. Most antibiotics will not affect your baby through your breast milk.  THRUSH Thrush (yeast) is a fungal infection that can form on your nipples or in your breast because it thrives on milk. The infection forms from an overgrowth of the candida organism. Candida usually exists in our bodies and is kept at healthy levels by the natural bacteria in our bodies. But, when the natural balance of bacteria is upset, candida can overgrow, causing an infection. Some of the things that can cause thrush include:   Having an overly moist environment on your skin or nipples that are sore or cracked.   Taking antibiotics, birth control pills or steroids.   Having a diet that contains large amounts of sugar or foods with yeast.   Having a chronic illness like HIV infection, diabetes, or anemia.  If you have sore nipples that last more than a few days even after you make sure your baby's latch and positioning is correct, or you suddenly get sore nipples after several weeks of unpainful nursing, you could have thrush. Some other signs of thrush include:   Pink, flaky, shiny, itchy or cracked nipples.   Deep pink and blistered nipples.    You also could have shooting pains deep in the breast during or after feedings, or achy breasts.  The infection also can form in your baby's mouth from having contact with your nipples, and appear as little white spots on the inside of the cheeks, gums, or tongue. It also can appear as a diaper rash (small red dots around a rash) on your baby that will not go away by using regular diaper rash ointments. Many babies with thrush refuse to nurse, or are gassy or cranky.  Solution:   If you or your baby have any of these symptoms, contact your doctor and your baby's doctor so you both can be correctly diagnosed.   You can get medication for your nipples and for your baby. Medication  for a mother is usually an ointment for the nipples. Your baby can be given a liquid medication for his/her mouth, and/or an ointment for any diaper rash.   Change disposable nursing pads often. Wash any towels or clothing that come in contact with the yeast in very hot water (above 122 F or 50 C).   Wear a clean bra every day. Wash your hands often, and wash your baby's hands often, especially if he or she sucks on his/her fingers.   Only use cotton pads.   Boil any pacifiers, bottle nipples, or toys your baby puts in his or her mouth once a day for 20 minutes to kill the thrush. After one week of treatment, discard pacifiers and nipples and buy new ones.   Boil daily for 20 minutes all breast pump parts that touch the milk.   Make sure other family members are free of thrush or other fungal infections. If they have symptoms, get them treatment.  NURSING STRIKE A nursing strike is when your baby has been nursing well for months, then suddenly loses interest in breastfeeding and begins to refuse the breast. A nursing strike can mean several things are happening with your baby and that she or he is trying to communicate with you to let you know that something is wrong. Not all babies will react the same to different  situations that can cause a nursing strike. Some will continue to breastfeed without a problem, others may just become fussy at the breast, and others will refuse the breast entirely. Some of the major causes of a nursing strike include:  Mouth pain from teething, a fungal infection, or a cold sore.   An ear infection.   Pain from a certain nursing position.   Being upset about a long separation from the mother or a major change in routine.   Being interested in other things around him or her.   A cold or stuffy nose that makes breathing difficult.   Reduced milk supply from supplementing with bottles or overuse of a pacifier.   Responding to the mother's strong reaction if the baby has bitten her.   Being upset about hearing arguing or people talking in a harsh voice with other family members while nursing.   Reacting to stress, over-stimulation, or having been repeatedly put off when wanting to nurse.   If your baby is on a nursing strike, it is normal to feel frustrated and upset, especially if your baby is unhappy. It is important not to feel guilty or that you have done something wrong. Your breasts also may become uncomfortable as the milk builds up.  Treatment  Try to express your milk manually or with a breast pump on the same schedule as the baby used to breastfeed to avoid engorgement and plugged ducts.   Try another feeding method temporarily to give your baby your milk, such as a cup, dropper, or spoon. Keep track of your baby's wet diapers to make sure he/she is getting enough milk (5 to 6 per day).   Keep offering your breast to the baby. If the baby is frustrated, stop and try again later. Try when the baby is sleeping or very sleepy.   Try various breastfeeding positions.   Focus on the baby with all of your attention and comfort him or her with extra touching and cuddling.   Try nursing while rocking and in a quiet room free of distractions.  INVERTED OR LARGE  NIPPLES Some women  have nipples that naturally are inverted, or that turn inward instead of protruding, or that are flat and do not protrude. Inverted or flat nipples can sometimes make it harder to breastfeed because your baby can have a harder time latching on. But remember that for breastfeeding to work, your baby has to latch on to both the nipple and the breast, so even inverted nipples can work just fine. Very large nipples can make it hard for the baby to get enough of the areola into his or her mouth to compress the milk ducts and get enough milk.  Know what type of nipples you have before you have your baby, so you can be prepared in case you have a problem getting your baby to latch on correctly.   Talk with a lactation consultant at the hospital or at a breastfeeding clinic for extra help if you have flat, inverted, or very large nipples.   Sometimes a Advertising copywriter can help inverted nipples to be pulled out with a small device before your baby is brought to your breast.   In many cases, inverted nipples will protrude more as the baby starts to latch on and as time passes. The baby's sucking will help.   Flat nipples cause fewer problems than inverted nipples. Good latch-on and positioning are usually enough to ensure that a baby latched to a flat nipple breastfeeds well.   The latch for babies of mothers with very large nipples will improve with time as the baby grows. In some cases, it might take several weeks to get the baby to latch well. A good milk supply helps insure that her baby will get enough milk.  RETURNING TO WORK More and more women are breastfeeding when they return to work because they believe in the benefits of breastfeeding. You can purchase or rent effective breast pumps and storage containers for your milk. Many employers are willing to set up special rooms for mothers who pump. But others are not as educated about the benefits of breastfeeding. Also, many women  are not able to take off as much time as they'd like after having their babies and might have to return to work before breastfeeding is well established. After you have your baby, take as much time off as possible. This will help you get your breastfeeding established well and may reduce the number of months you may need to pump your milk while you are at work.   If you plan to have your baby take a bottle of expressed breast milk while you are at work, you can introduce your baby to a bottle when he or she is around four weeks old. Otherwise, the baby might not accept the bottle later on. Once your baby is comfortable taking a bottle, it is a good idea to have Dad or another family member offer a bottle of pumped breast milk on a regular basis so the baby stays in practice.   Let your employer or Geophysical data processor know that you plan to continue breastfeeding once you return to work. Before you return to work, or even before you have your baby, start talking with your employer about breastfeeding. Do not be afraid to request a clean and private area where you can pump your milk. If you do not have your own office space, you can ask to use a supervisor's office during certain times. Or you can ask to have a clean, clutter free corner of a storage room. All you need  is a chair, a small table, and an outlet if you are using an electric pump. Many electric pumps also can run on batteries and do not require an outlet. You can lock the door and place a small sign on it that asks for some privacy. You can pump your breast milk during lunch or other breaks. You could suggest to your employer that you are willing to make up work time for time spent pumping milk.   After pumping, you can refrigerate your milk, place it in a cooler, or freeze it for the baby to be fed later. Many breast pumps come with carrying cases that have a section to store your milk with ice packs. If you do not have access to a  refrigerator, you can leave it at room temperatures for up to 6 hours.   Many employers are not aware of state laws that state they have to allow you to breastfeed at your job. Under these laws, your employer is required to set up a space for you to breastfeed and/or allow paid/unpaid time for breastfeeding employees. To see if your state has a breastfeeding law for employers, go to https://www.warren.info/ or call us at 1-800-LALECHE (in [place]).  JAUNDICE  Jaundice is a condition that is common in many newborns. It appears as a yellowing of the skin and eyes. It is caused by an excess of bilirubin, a yellow pigment that is a product in the blood. All babies are born with extra red blood cells that undergo a process of being broken down and eliminated from the body. Bilirubin levels in the blood can be high because of:  Higher production of it in a newborn.   Increased ability of the newborn intestine to absorb it.   Limited ability of the newborn liver to handle large amounts of it.  Many cases of jaundice do not need to be treated. Your baby's doctor will carefully monitor your baby's bilirubin levels. Sometimes infants have to be temporarily separated from their mothers to receive special treatment with phototherapy (aiming lights on the baby). In these cases, breastfeeding is encouraged but supplements may also be given to the baby. American Academy of Pediatrics advises against stopping breastfeeding in jaundiced babies and suggests continuing frequent breastfeeding, even during treatment. If your baby is jaundiced or develops jaundice, it is important to discuss with your baby's caregiver all possible treatment options. Share that you do not want to interrupt nursing if this is at all possible.  REFLUX  It is not unusual for babies to spit up after nursing. Usually, babies can spit up and show no other signs of illness. The spitting up disappears as the baby's digestive system matures.  As long as the baby has 6 to 8 wet diapers and at least 2 bowel movements in a 24 hour period (under 59 weeks of age), and your baby is gaining weight (at least 4 ounces a week) you can be assured your baby is getting enough milk.  However, some babies have a condition called gastroesophageal reflux (GER). This happens when the muscle at the opening of the stomach opens at the wrong times, allowing milk and food to come back up into the the tube in the throat (esophagus). Symptoms of GER can include:   Crying as if in discomfort.   Waking up frequently at night.   Problems swallowing.   Frequent red or sore throat.   Signs of asthma, bronchitis, wheezing or problems breathing.   Projectile vomiting.  Arching of the back as if in severe pain.   Slow weight gain.   Gagging or choking.   Frequent hiccupping or burping.   Severe spitting up, or spitting up after every feeding, or hours after eating.   Refusal to eat.  Many healthy babies might have some of these symptoms and do not have GER. But there are babies who might only have a few of these symptoms and have a severe case of GER. Not all babies with GER spit up or vomit.  Some babies with GER do not have a serious medical problem. But caring for them can be hard since they tend to be very fussy and wake up frequently at night. More severe cases of GER may need to be treated with medication if the baby, in addition to spitting up, also refuses to nurse, gains weight poorly or is losing weight, or has periods of gagging or choking.   If your baby spits up after every feeding and has any of the other symptoms mentioned above, it is best to see his or her doctor for a correct diagnosis. Other than GER, your baby could have another condition that needs treatment. If there are no other signs of illness, he/she could just be sensitive to a food in your diet or a medication he/she is receiving. If your baby has GER, it is important to try to  continue to breastfeed since breast milk still is more easily digested than formula. Try smaller, more frequent feedings, thorough burping, and putting the baby in an upright position during and after feedings.  CLEFT PALATE AND CLEFT LIP  Cleft palate and cleft lip are some of the most common birth defects that happen as a baby is developing in the womb. A cleft, or opening, in either the palate or lip can happen together or separately and both can be corrected through surgery. Both conditions can prevent babies from breastfeeding because a baby cannot form a good seal around the nipple and areola with his or her mouth, or get milk out of the breast well.   Cleft palate can happen on one or both sides of a baby's mouth and be partial or complete. Right after birth, a mother whose baby has a cleft palate can try to breastfeed her baby, and she can start expressing her milk right away to keep up her supply. Even if her baby cannot latch on well to her breast, the baby can be fed breast milk by cup. In some hospitals, babies with cleft palate are fitted with a mouthpiece called an obturator. This fits into the cleft and seals it for easier feeding. The baby should be able to exclusively breastfeed after surgery.   Cleft lip can happen on one or both sides of a baby's lip. But a mother can try different breastfeeding positions and use her thumb or breast to help fill in the opening left by the lip to form a seal around the breast. With cleft lip repair, breastfeeding may only have to be stopped for a few hours.   If your baby is born with a cleft palate or cleft lip, talk with a lactation consultant in the hospital for assistance as soon as possible. Human milk and early breastfeeding is still best for your baby's health.  TWINS OR MULTIPLES Mothers of twins or multiples might feel overwhelmed with the idea of breastfeeding more than one baby at a time. The benefits of human milk to both these mothers and  babies are the same as for all mothers and babies. When breastfeeding twins, your breast milk will increase to the amount the babies will need. You will have to take in more calories and liquids when nursing twins. If the babies are premature and unable to nurse, you can pump your breasts and freeze the milk until the babies are ready to nurse. But mothers of multiples get even more benefits from breastfeeding:  Their uterus contracts, which is helpful because it has stretched even more to hold more than one baby.   Hormones are released that relax the mother, which is helpful with the added stress of caring for more than one baby.   Eight to ten hours per week are saved because there is no need to prepare formula or bottles and the mother's milk is available right away.   Breastfeeding early and often for a mother of multiples is important to keep up her milk supply. A good latch-on for each baby is important to avoid sore nipples. Many mothers find that it is easier to nurse the babies together rather than separately, and that it gets easier as the babies get older. There are many breastfeeding holds that make it easier to nurse more than one baby at a time. If you are having multiples, talk with a lactation consultant about more ways you can successfully breastfeed your babies.  BREASTFEEDING DURING PREGNANCY While most mothers who are nursing a toddler stop breastfeeding if they find out they are pregnant, it is an individual choice to decide whether to keep breastfeeding during the pregnancy. It is not unsafe for the unborn child if you continue to breastfeed an older child during this time. But, if you are having some problems in your pregnancy such as uterine pain or bleeding, a history of preterm labor or problems gaining weight during pregnancy, your doctor may advise you to wean. Your child also may decide to wean on his or her own because pregnancy changes the amount and flavor of your milk.  Some women also choose to wean at this time because they have nipple soreness caused by pregnancy hormones, are nauseous, tire more easily, or find that their growing stomachs make breastfeeding uncomfortable. You will need more calories when you breastfeed while pregnant. Your milk production usually slows down around the fourth month of pregnancy.  BREASTFEEDING AFTER BREAST SURGERY   If you have had breast surgery, including breast implants, you might be worried about whether you will be able to breastfeed. The most important things that affect whether you can produce enough milk for your baby are how your surgery was done and where your incisions are, and the reasons for your surgery. For example, women who have had incisions in the fold under the breasts are less likely to have problems producing milk than women who have had incisions around or across the areola. Incisions around the areola can cut into milk ducts and nerves, where milk is produced and travels. Women who have had breast surgery to augment breasts that never fully developed may not have enough glands to produce a full milk supply.   If you had breast surgery and are worried about how it will affect breastfeeding, talk with a Advertising copywriter. If you are planning breast surgery and are worried about how it will affect breastfeeding, talk with your surgeon about ways he or she can preserve as much of the breast tissue and milk ducts as possible.  INDUCING LACTATION  Many mothers who adopt  want to breastfeed their babies and can do it successfully with some help. It is successful ? to  of the time it is tried. Many will need to supplement their breast milk with donated breast milk or infant formula. But some adoptive mothers can breastfeed exclusively, especially if they have been pregnant before. Lactation is a hormonal response to a physical action. So the stimulation of the baby nursing causes the body to see a need for and produce  milk. The more the baby nurses, the more a woman's body will produce milk.   Beginning a combination of hormone treatment several months before the baby is born should help facilitate lactation in many cases.   One thing you can do to prepare is to pump every 3 hours around the clock for two to three weeks before your baby arrives. Or you can wait until the baby arrives and starts to nurse. Breast milk can be frozen until you are ready to nurse the baby. A supplemental nursing system (SNS) or a lactation aid can help ensure that your baby gets enough nutrition and that your breasts are stimulated to produce milk at the same time.   If you are an adoptive mother who wants to breastfeed, you should see both a Advertising copywriter and your doctor for help in establishing an initial milk supply.  FOR MORE INFORMATION [employer]: www.llli.org Document Released: 07/06/2005 Document Revised: 01/01/2011 Document Reviewed: 09/28/2007 Ileus The intestine (bowel, or gut) is a long muscular tube connecting your stomach to your rectum. If the intestine stops working, food cannot pass through. This is called an ileus. This can happen for a variety of reasons. Ileus is a major medical problem that usually requires hospitalization. If your intestine stops working because of a blockage, this is called a bowel obstruction, and is a different condition. CAUSES   Surgery in your abdomen. This can last from a few hours to a few days.   An infection or inflammation in the belly (abdomen). This includes inflammation of the lining of the abdomen (peritonitis).   Infection or inflammation in other parts of the body, such as pneumonia or pancreatitis.   Passage of gallstones or kidney stones.   Damage to the nerves or blood vessels which go to the bowel.   Imbalance in the salts in the blood (electrolytes).   Injury to the brain and or spinal cord.   Medications. Many medications can cause ileus  or make it worse. The most common of these are strong pain medications.  SYMPTOMS  Symptoms of bowel obstruction come from the bowel inactivity. They may include:  Bloating. Your belly gets bigger (distension).   Pain or discomfort in the abdomen.   Poor appetite, feeling sick to your stomach (nausea) and vomiting.   You may also not be able to hear your normal bowel sounds, such as "growling" in your stomach.  DIAGNOSIS   Your history and a physical exam will usually suggest to your caregiver that you have an ileus.   X-rays or a CT scan of your abdomen will confirm the diagnosis. X-rays, CT scans and lab tests may also suggest the cause.  TREATMENT   Rest the intestine until it starts working again. This is most often accomplished by:   Stopping intake of oral food and drink. Dehydration is prevented by using IV (intravenous) fluids.   Sometimes, a naso-gastric tube (NG tube) is needed. This is a narrow plastic tube inserted through your nose and into  your stomach. It is connected to suction to keep the stomach emptied out. This also helps treat the nausea and vomiting.   If there is an imbalance in the electrolytes, they are corrected with supplements in your intravenous fluids.   Medications that might make an ileus worse might be stopped.   There are no medications that reliably treat ileus, though your caregiver may suggest a trial of certain medications.   If your condition is slow to resolve, you will be re-evaluated to be sure another condition, such as a blockage, is not present.  Ileus is common and usually has a good outcome. Depending on cause of your ileus, it usually can be treated by your caregivers with good results. Sometimes, specialists (surgeons or gastroenterologists) are asked to assist in your care.  HOME CARE INSTRUCTIONS   Follow your caregiver's instructions regarding diet and fluid intake. This will usually include drinking plenty of clear fluids,  avoiding alcohol and caffeine, and eating a gentle diet.   Follow your caregiver's instructions regarding activity. A period of rest is sometimes advised before returning to work or school.   Take only medications prescribed by your caregiver. Be especially careful with narcotic pain medication, which can slow your bowel activity and contribute to ileus.   Keep any follow-up appointments with your caregiver or specialists.  SEEK MEDICAL CARE IF:   You have a recurrence of nausea, vomiting or abdominal discomfort.   You develop fever of more than 102 F (38.9 C).  SEEK IMMEDIATE MEDICAL CARE IF:   You have severe abdominal pain.   You are unable to keep fluids down.  Document Released: 01/15/2003 Document Revised: 01/01/2011 Document Reviewed: 05/17/2008 [employer] Patient Information 2012 [place], [place]. [employer] Patient Information 2012 [place], [place]. Postpartum Depression and Baby Blues The postpartum period begins right after the birth of a baby. During this time, there is often a great amount of joy and excitement. It is also a time of considerable changes in the life of the parent(s). Regardless of how many times a mother gives birth, each child brings new challenges and dynamics to the family. It is not unusual to have feelings of excitement accompanied by confusing shifts in moods, emotions, and thoughts. All mothers are at risk of developing postpartum depression or the "baby blues." These mood changes can occur right after giving birth, or they may occur many months after giving birth. The baby blues or postpartum depression can be mild or severe. Additionally, postpartum depression can resolve rather quickly, or it can be a long-term condition. CAUSES Elevated hormones and their rapid decline are thought to be a main cause of postpartum depression and the baby blues. There are a number of hormones that radically change during and after pregnancy. Estrogen and progesterone usually  decrease immediately after delivering your baby. The level of thyroid hormone and various cortisol steroids also rapidly drop. Other factors that play a major role in these changes include major life events and genetics.  RISK FACTORS If you have any of the following risks for the baby blues or postpartum depression, know what symptoms to watch out for during the postpartum period. Risk factors that may increase the likelihood of getting the baby blues or postpartum depression include:  Havinga personal or family history of depression.   Having depression while being pregnant.   Having premenstrual or oral contraceptive-associated mood issues.   Having exceptional life stress.   Having marital conflict.   Lacking a social support network.   Having  a baby with special needs.   Having health problems such as diabetes.  SYMPTOMS Baby blues symptoms include:  Brief fluctuations in mood, such as going from extreme happiness to sadness.   Decreased concentration.   Difficulty sleeping.   Crying spells, tearfulness.   Irritability.   Anxiety.  Postpartum depression symptoms typically begin within the first month after giving birth. These symptoms include:  Difficulty sleeping or excessive sleepiness.   Marked weight loss.   Agitation.   Feelings of worthlessness.   Lack of interest in activity or food.  Postpartum psychosis is a very concerning condition and can be dangerous. Fortunately, it is rare. Displaying any of the following symptoms is cause for immediate medical attention. Postpartum psychosis symptoms include:  Hallucinations and delusions.   Bizarre or disorganized behavior.   Confusion or disorientation.  DIAGNOSIS  A diagnosis is made by an evaluation of your symptoms. There are no medical or lab tests that lead to a diagnosis, but there are various questionnaires that a caregiver may use to identify those with the baby blues, postpartum depression, or  psychosis. Often times, a screening tool called the [place] Postnatal Depression Scale is used to diagnose depression in the postpartum period.  TREATMENT The baby blues usually goes away on its own in 1 to 2 weeks. Social support is often all that is needed. You should be encouraged to get adequate sleep and rest. Occasionally, you may be given medicines to help you sleep.  Postpartum depression requires treatment as it can last several months or longer if it is not treated. Treatment may include individual or group therapy, medicine, or both to address any social, physiological, and psychological factors that may play a role in the depression. Regular exercise, a healthy diet, rest, and social support may also be strongly recommended.  Postpartum psychosis is more serious and needs treatment right away. Hospitalization is often needed. HOME CARE INSTRUCTIONS  Get as much rest as you can. Nap when the baby sleeps.   Exercise regularly. Some women find yoga and walking to be beneficial.   Eat a balanced and nourishing diet.   Do little things that you enjoy. Have a cup of tea, take a bubble bath, read your favorite magazine, or listen to your favorite music.   Avoid alcohol.   Ask for help with household chores, cooking, grocery shopping, or running errands as needed. Do not try to do everything.   Talk to people close to you about how you are feeling. Get support from your partner, family members, friends, or other new moms.   Try to stay positive in how you think. Think about the things you are grateful for.   Do not spend a lot of time alone.   Only take medicine as directed by your caregiver.   Keep all your postpartum appointments.   Let your caregiver know if you have any concerns.  SEEK MEDICAL CARE IF: You are having a reaction or problems with your medicine. SEEK IMMEDIATE MEDICAL CARE IF:  You have suicidal feelings.   You feel you may harm the baby or someone else.   Document Released: 10/17/2003 Document Revised: 01/01/2011 Document Reviewed: 11/18/2010 [facility] Patient Information 2012 [place], [place].

## 2011-07-04 NOTE — Progress Notes (Signed)
Patient states "I feel really nauseous and very cold" Patient sat up to go to bathroom and started to feel nauseous. Patient c/o pain at incision and J/P site of a 5/10. Patient refuses to take pain or nausea medication due to "Fear it's going to make things worst" Gave patient crackers, gave emotional support and layed her back to relax. Patient states " I feel a little bit better, I think the crackers help". Vitals signs taking. B/P 158/95. Notified CNM on call to up date on status. Will continue to monitor.

## 2011-07-05 ENCOUNTER — Telehealth: Payer: Self-pay | Admitting: Obstetrics and Gynecology

## 2011-07-05 NOTE — Telephone Encounter (Signed)
Pt called stated pharmacy did not have rx for pain meds that were ordered, requesting liquid tylenol and liquid motrin, called pharmacy, [facility] in [place] [phone], pharmacy states insurance does not cover liquid RX, phoned pt to inform to buy OTC.

## 2011-07-07 ENCOUNTER — Telehealth: Payer: Self-pay | Admitting: Obstetrics and Gynecology

## 2011-07-07 NOTE — Telephone Encounter (Signed)
VM from [facility], [address] Love nurse.   Pt's BP 118/74 (rt arm) and 118/80 (lt arm.)  States pt looks great.   Incision looks good.   Abd soft.  Doing well with breast feeding.   [facility] informed. No further F/U needed at this time.

## 2011-07-23 ENCOUNTER — Ambulatory Visit (INDEPENDENT_AMBULATORY_CARE_PROVIDER_SITE_OTHER): Payer: Medicaid Other | Admitting: Obstetrics and Gynecology

## 2011-07-23 ENCOUNTER — Encounter: Payer: Self-pay | Admitting: Obstetrics and Gynecology

## 2011-07-23 NOTE — Progress Notes (Signed)
DATE OF SURGERY: 06-24-11 TYPE OF SURGERY:C-Section PAIN:Yes "only at incision" VAG BLEEDING: yes VAG DISCHARGE: no NORMAL GI FUNCTN: no NORMAL GU FUNCTN: yes  Date of delivery: 06-24-11 Female Name: [name] Vaginal delivery:no Cesarean section:yes Tubal ligation:no GDM:no Breast Feeding:yes Bottle Feeding:no Post-Partum Blues:no Abnormal pap:no Normal GU function: yes Normal GI function:no Returning to work:no EPDS: 3  HISTORY OF PRESENT ILLNESS  Ms. Makinsey Pepitone is a 26 y.o. year old female,[id number], who presents for a postpartum visit.  The patient had a cesarean section on Jun 24, 2011. Her postop course was complicated by a severe ileus requiring long-term hospitalization.  She is doing better at this point.  Subjective:  The patient continues to struggle with taking care of her newborn and caring for the children she has at home.  Objective:  BP 104/78  Temp 98.6 F (37 C) (Oral)  Resp 16  Ht 5\' 2"  (1.575 m)  Wt 159 lb (72.122 kg)  BMI 29.08 kg/m2  Breastfeeding? Yes   GI: soft, non-tender; bowel sounds normal; no masses,  no organomegaly and incision: clean, dry and intact  External genitalia: normal general appearance Vaginal: normal without tenderness, induration or masses Cervix: normal appearance Adnexa: normal bimanual exam Uterus: upper limits normal size  Assessment:  Status post cesarean section.  Plan:  Breast-feeding. Plans vasectomy for contraception.  Condoms recommended in the meantime. E PDS: 3 Followup at [facility] as needed.  Return to office in 2 month(s) for annual exam.   Leonard Schwartz M.D.  07/23/2011 8:14 PM

## 2011-08-06 NOTE — Discharge Summary (Signed)
Obstetric Discharge Summary Reason for Admission: onset of labor Prenatal Procedures: ultrasound Intrapartum Procedures: cesarean: low cervical, transverse Postpartum Procedures: transfusion 3 units PRBC's, antibiotics and exploratory lap for hemoperitoneum on 5/30;  Complications-Operative and Postpartum: hemoperitoneum; fever/chorioamnionitis; severe PP anemia; ileus; elevated BP;  Hemoglobin  Date Value Range Status  07/03/2011 10.7* 12.0 - 15.0 g/dL Final     HCT  Date Value Range Status  07/03/2011 32.5* 36.0 - 46.0 % Final  Hospital Course:  Pt admitted in early labor on 06/24/11 at [redacted]w[redacted]d; pt w/ previous h/o c/s and plan for repeat.  Repeat c/s performed by Dr. Estanislado Pandy, and pt and newborn tolerated procedure well.  On POD#1, pt developed abdominal distension w/ severe pain, N/V, no flatus, and bedside u/s suspicious for fluid in abdomen, and pt consented for exploratory lap for suspicion of acute abdomen.  Dr. Su Hilt performed laparotomy, with consult from Dr. Normand Sloop, and large hemoperitoneum noted.  Intraop Hgb down to 8.3.  JP drain left in place.  Pt tolerated well, but transferred to AICU.  On POD#2, lap day #1, tmax of 101.9 and Hgb down to 7.2 and pt received 3 units PRBc and IV ATB cont'd for suspected chorioamnionitis.  After transfer to m/b that evening, further abdominal distension noted with pain and nausea into early morning of POD#3.  Pt made NPO and given antiemetics and reglan w/ no relief. Ileus suspected by Dr. Stefano Gaul on 06/27/11 (POD#3).  Hgb up to 10.6 post-transfusion.  Abdominal xray showed dilated loops of small bowel suggestive of ileus vs obstruction.  NGT placed and pt remained NPO and ABX cont'd.  Pt with progressing anxiety r/t dx and trx and started on Ativan prn.  Hypokalemia noted on POD#4 and replacement started.  On 06/29/11, POD#5, NG clamped and foley d/c'd and pt enc'd to ambulate; diet was advanced to clears and tol well, and by that evening, NGT d/c'd and diet  advanced.  BF'ng was established PP and going well, despite pt's set-backs.  POD#6, nausea and bloating returned, and diet again transitioned back to NPO that evening and pt continued IV Abx and reglan.  BP had started to trend up by that point, and on 6/5, PIH labs drawn and WNL except uric acid and LDH slightly elevated.  Abdominal Xray repeated on 6/5 and showed improving ileus pattern and no obstruction.  Evening of 6/5, pt received Soap Suds enema w/ some benefit.  On 6/6, POD#8, pt was w/o n/v, and again diet advanced .  Soap suds enema was again repeated.  Abx were d/c'd secondary to infiltrated IV after 6d total course w/ original plan for 7d course.  On POD#9, 6/7, nausea again returned and pt anxious again r/e po intake.  She had been avoiding po meds as well, but minimal incision pain.  BPs were running 130s-140s/80s, but uric acid and LDH had increased more to high of 10.9 and 282 respectively.  24 hr urine then collected and =219mg  total protein.  On 6/8, POD#10, pt advanced diet to crackers, juice, and oatmeal, and tolerated well.  After 24 hr urine resulted, Dr. Estanislado Pandy deemed pt to have received full benefit of hospital stay and pt d/c'd home in stable condition with PIH precautions.    Physical Exam:  General: alert, cooperative and anxious Lochia: appropriate Uterine Fundus: firm, below umbilicus Incision: healing well, no significant drainage, JP drain removed by dr. Estanislado Pandy DVT Evaluation: No evidence of DVT seen on physical exam. Negative Homan's sign. No significant calf/ankle edema.  Discharge Diagnoses: Term Pregnancy-delivered and s/p repeat LTCS on 5/29 & exploratory laparotomy 5/30; lactating; stable PP anemia s/p transfusion; transient PP BP elevations; elevated uric acid and LDH PP, but other PIH labs normal; improving PP ileus. MRSA pos preop screen;   Discharge Information: Date: 07/04/11 post-c/s day #10 & post-exp lap Day #9 Activity: pelvic rest Diet: diet as tolerated;    Medications: desired liquid motrin and tylenol; also Rx'd liquid roxicodone; rec'd probiotic and continue PNV; miralax/colace prn. Condition: stable Instructions: refer to practice specific booklet Discharge to: home Follow-up Information    Follow up with [facility] in 4 weeks. (with Dr. Estanislado Pandy, or call as needed with any questions or concerns)       Follow up with [facility] Nurse. (will come for home visit next week to check your blood pressure and overall status)          Newborn Data: Live born female "[name]" (delivery provider:  Dr. Silverio Lay, MD) Birth Weight: 7 lb 10.1 oz (3460 g) APGAR: 8, 9  Home with mother.  [name] H 08/06/2011, 9:57 AM

## 2011-09-22 ENCOUNTER — Ambulatory Visit (INDEPENDENT_AMBULATORY_CARE_PROVIDER_SITE_OTHER): Payer: Medicaid Other | Admitting: Obstetrics and Gynecology

## 2011-09-22 ENCOUNTER — Encounter: Payer: Self-pay | Admitting: Obstetrics and Gynecology

## 2011-09-22 VITALS — BP 90/60 | HR 70 | Ht 62.0 in | Wt 169.0 lb

## 2011-09-22 DIAGNOSIS — Z0000 Encounter for general adult medical examination without abnormal findings: Secondary | ICD-10-CM

## 2011-09-22 DIAGNOSIS — Z01419 Encounter for gynecological examination (general) (routine) without abnormal findings: Secondary | ICD-10-CM

## 2011-09-22 NOTE — Progress Notes (Signed)
Regular Periods: no Mammogram: no  Monthly Breast Ex.: yes Exercise: yes  Tetanus < 10 years: yes Seatbelts: yes  NI. Bladder Functn.: yes Abuse at home: no  Daily BM's: no not since surgery Stressful Work: no  Healthy Diet: yes Sigmoid-Colonoscopy: n/a  Calcium: yes Medical problems this year: Problems after blood transfusion   LAST PAP:12/03/10  Contraception: none  Mammogram:  n/a  PCP: n/a  PMH: none  FMH: none  Last Bone Scan: n/a  ANNUAL GYNECOLOGIC EXAMINATION   [name] is a 26 y.o. female, [id number], who presents for an annual exam. See above. She complains of mid abdominal pain.  She has pain when she has her bowel movements.  She says that her cesarean section incision is still tender from May of 2013.  She is not sexually active.  Vasectomy is planned.  Prior Hysterectomy: No    History   Social History  . Marital Status: Married    Spouse Name: N/A    Number of Children: N/A  . Years of Education: N/A   Social History Main Topics  . Smoking status: Never Smoker   . Smokeless tobacco: Never Used  . Alcohol Use: No  . Drug Use: No  . Sexually Active: Yes    Birth Control/ Protection: None   Other Topics Concern  . None   Social History Narrative  . None    Menstrual cycle:   LMP: No LMP recorded.             The following portions of the patient's history were reviewed and updated as appropriate: allergies, current medications, past family history, past medical history, past social history, past surgical history and problem list.  Review of Systems Pertinent items are noted in HPI. Breast:Negative for breast lump,nipple discharge or nipple retraction Gastrointestinal: Negative for abdominal pain, change in bowel habits or rectal bleeding Urinary:negative   Objective:    BP 90/60  Pulse 70  Ht 5\' 2"  (1.575 m)  Wt 169 lb (76.658 kg)  BMI 30.91 kg/m2  Breastfeeding? Yes    Weight:  Wt Readings from Last 1 Encounters:  09/22/11 169  lb (76.658 kg)          BMI: Body mass index is 30.91 kg/(m^2).  General Appearance: Alert, appropriate appearance for age. No acute distress HEENT: Grossly normal Neck / Thyroid: Supple, no masses, nodes or enlargement Lungs: clear to auscultation bilaterally Back: No CVA tenderness Breast Exam: No masses or nodes.No dimpling, nipple retraction or discharge. Cardiovascular: Regular rate and rhythm. S1, S2, no murmur Gastrointestinal: Soft, non-tender, no masses or organomegaly  ++++++++++++++++++++++++++++++++++++++++++++++++++++++++  Pelvic Exam: External genitalia: normal general appearance Vaginal: normal without tenderness, induration or masses and relaxation: Yes Cervix: normal appearance Adnexa: normal bimanual exam Uterus: normal size, shape, and consistency Rectovaginal: normal rectal, no masses  ++++++++++++++++++++++++++++++++++++++++++++++++++++++++  Lymphatic Exam: Non-palpable nodes in neck, clavicular, axillary, or inguinal regions Neurologic: Normal speech, no tremor  Psychiatric: Alert and oriented, appropriate affect.   Wet Prep:   not applicable Urinalysis:  not applicable UPT:           Not done   Assessment:    Normal gyn exam   Overweight or obese: Yes   Pelvic relaxation: Yes  Painful bowel movements.  Midabdominal pain   Plan:    return annually or prn Contraception:vasectomy    Medications prescribed: none  STD screen request: No   The updated Pap smear screening guidelines were discussed with the patient. The patient requested that  I obtain a Pap smear: No.  Kegel exercises discussed: No.  Proper diet and regular exercise were reviewed.  The patient will see her primary physician to evaluate her abdominal pain and her bowel movement issues.   Mylinda Latina.D.

## 2011-11-23 ENCOUNTER — Emergency Department (HOSPITAL_COMMUNITY)
Admission: EM | Admit: 2011-11-23 | Discharge: 2011-11-23 | Disposition: A | Discharge status: Home / Self Care | Payer: Medicaid Other | Attending: Emergency Medicine | Admitting: Emergency Medicine

## 2011-11-23 ENCOUNTER — Encounter (HOSPITAL_COMMUNITY): Payer: Self-pay | Admitting: Emergency Medicine

## 2011-11-23 ENCOUNTER — Emergency Department (HOSPITAL_COMMUNITY): Payer: Medicaid Other

## 2011-11-23 DIAGNOSIS — M25562 Pain in left knee: Secondary | ICD-10-CM

## 2011-11-23 DIAGNOSIS — Y9239 Other specified sports and athletic area as the place of occurrence of the external cause: Secondary | ICD-10-CM | POA: Insufficient documentation

## 2011-11-23 DIAGNOSIS — N760 Acute vaginitis: Secondary | ICD-10-CM | POA: Insufficient documentation

## 2011-11-23 DIAGNOSIS — Z8742 Personal history of other diseases of the female genital tract: Secondary | ICD-10-CM | POA: Insufficient documentation

## 2011-11-23 DIAGNOSIS — Y939 Activity, unspecified: Secondary | ICD-10-CM | POA: Insufficient documentation

## 2011-11-23 DIAGNOSIS — S8990XA Unspecified injury of unspecified lower leg, initial encounter: Secondary | ICD-10-CM | POA: Insufficient documentation

## 2011-11-23 DIAGNOSIS — Z8669 Personal history of other diseases of the nervous system and sense organs: Secondary | ICD-10-CM | POA: Insufficient documentation

## 2011-11-23 DIAGNOSIS — Y9302 Activity, running: Secondary | ICD-10-CM | POA: Insufficient documentation

## 2011-11-23 DIAGNOSIS — S99929A Unspecified injury of unspecified foot, initial encounter: Secondary | ICD-10-CM | POA: Insufficient documentation

## 2011-11-23 DIAGNOSIS — Z87442 Personal history of urinary calculi: Secondary | ICD-10-CM | POA: Insufficient documentation

## 2011-11-23 DIAGNOSIS — Z8659 Personal history of other mental and behavioral disorders: Secondary | ICD-10-CM | POA: Insufficient documentation

## 2011-11-23 DIAGNOSIS — X500XXA Overexertion from strenuous movement or load, initial encounter: Secondary | ICD-10-CM | POA: Insufficient documentation

## 2011-11-23 MED ORDER — ACETAMINOPHEN 500 MG PO TABS: 500.0000 mg | ORAL_TABLET | Freq: Four times a day (QID) | ORAL | Status: DC | PRN

## 2011-11-23 MED ORDER — ACETAMINOPHEN 500 MG PO TABS
1000.0000 mg | ORAL_TABLET | Freq: Once | ORAL | Status: AC
  Administered 2011-11-23: 1000 mg via ORAL
  Filled 2011-11-23: qty 2

## 2011-11-23 NOTE — ED Provider Notes (Signed)
History     CSN: [id number]  Arrival date & time 11/23/11  1478   First MD Initiated Contact with Patient 11/23/11 0945      Chief Complaint  Patient presents with  . Knee Pain    (Consider location/radiation/quality/duration/timing/severity/associated sxs/prior treatment) HPI Comments: Patient is an otherwise healthy 26 year old female who presents with left knee pain. The mechanism of injury was sudden ankle inversion. Patient reports gradual onset of aching, moderate pain that is localized to left knee after running a 5K race 3 days ago. Patient reports progressive worsening of pain since onset. Weight bearing activity make the pain worse. Nothing makes the pain better. Patient reports associated swelling. Patient has tried icy hot wrap for pain with mild relief. Patient denies obvious deformity, numbness/tingling, coolness/weakness of extremity, bruising, and any other injury.      Past Medical History  Diagnosis Date  . Kidney stones   . Panic attack   . Kidney stones   . Ovarian cyst   . Headache   . Migraine   . History of renal stone   . BV (bacterial vaginosis)   . History of kidney stones   . Cyst   . Abnormal Pap smear   . Lactating mother 07/04/2011  . Postpartum hypertension 07/04/2011    D/c home 07/04/11 after Normal 24 hr urine=219mg  total protein.  No medications for BP during hospitalization or for d/c.   [facility] RN ordered for home visit middle of the week to check BP and pt's status.    Past Surgical History  Procedure Date  . Cesarean section 03/2009  . Wisdom tooth extraction 2008, 02/2010  . Cesarean section 06/24/2011    Procedure: CESAREAN SECTION;  Surgeon: Esmeralda Arthur, MD;  Location: [facility];  Service: Gynecology;  Laterality: N/A;  . Laparotomy 06/25/2011    Procedure: EXPLORATORY LAPAROTOMY;  Surgeon: Purcell Nails, MD;  Location: [facility];  Service: Gynecology;  Laterality: N/A;    Family History  Problem Relation Age of Onset  .  Hypotension Neg Hx   . Anesthesia problems Neg Hx   . Malignant hyperthermia Neg Hx   . Pseudochol deficiency Neg Hx   . Hypertension Mother   . Thrombophlebitis Mother   . Anemia Mother   . Heart disease Father   . Hypertension Father   . Diabetes Father   . Kidney disease Father   . Sickle cell trait Sister   . Asperger's syndrome Brother   . Lupus Maternal Aunt   . Lupus Paternal Aunt   . Heart attack Maternal Grandmother   . Thrombophlebitis Maternal Grandmother   . Transient ischemic attack Maternal Grandmother   . Heart disease Maternal Grandmother     MI  . Alzheimer's disease Maternal Grandfather   . Bipolar disorder Maternal Grandfather   . Heart murmur Maternal Grandfather   . Mental illness Maternal Grandfather     History  Substance Use Topics  . Smoking status: Never Smoker   . Smokeless tobacco: Never Used  . Alcohol Use: No    OB History    Grav Para Term Preterm Abortions TAB SAB Ect Mult Living   2 2 2       2       Review of Systems  Musculoskeletal: Positive for joint swelling and arthralgias.  All other systems reviewed and are negative.    Allergies  Review of patient's allergies indicates no known allergies.  Home Medications   Current Outpatient Rx  Name Route Sig Dispense Refill  . ACETAMINOPHEN 500 MG/5ML PO LIQD Oral Take 10 mLs (1,000 mg total) by mouth every 6 (six) hours as needed (for pain). 300 mL 2  . IBUPROFEN 100 MG/5ML PO SUSP Oral Take 30 mLs (600 mg total) by mouth every 6 (six) hours as needed for pain. 840 mL 6  . OVER THE COUNTER MEDICATION Oral Take 2 tablets by mouth daily. Patient takes prenatal gummies    . OXYCODONE HCL 5 MG/5ML PO SOLN Oral Take 5 mLs (5 mg total) by mouth every 4 (four) hours as needed for pain. 50 mL 0    BP 111/65  Pulse 72  Temp 98.7 F (37.1 C) (Oral)  Resp 20  SpO2 99%  Breastfeeding? Yes  Physical Exam  Nursing note and vitals reviewed. Constitutional: She is oriented to person,  place, and time. She appears well-developed and well-nourished. No distress.  HENT:  Head: Normocephalic and atraumatic.  Eyes: Conjunctivae normal and EOM are normal. No scleral icterus.  Neck: Normal range of motion. Neck supple.  Cardiovascular: Normal rate, regular rhythm and intact distal pulses.  Exam reveals no gallop and no friction rub.   No murmur heard. Pulmonary/Chest: Effort normal and breath sounds normal. She has no wheezes. She has no rales. She exhibits no tenderness.  Abdominal: Soft. She exhibits no distension.  Musculoskeletal: Normal range of motion.       Edematous left knee that is tender to palpation. Left knee slightly limited ROM due to edema and pain. No calf tenderness noted.   Neurological: She is alert and oriented to person, place, and time. Coordination normal.       Strength and sensation equal and intact bilaterally. Speech is goal-oriented. Moves limbs without ataxia.   Skin: Skin is warm and dry.  Psychiatric: She has a normal mood and affect. Her behavior is normal.    ED Course  Procedures (including critical care time)  Labs Reviewed - No data to display Dg Knee Complete 4 Views Left  11/23/2011  *RADIOLOGY REPORT*  Clinical Data: Knee pain, swelling.  LEFT KNEE - COMPLETE 4+ VIEW  Comparison: None.  Findings: No acute bony abnormality.  Specifically, no fracture, subluxation, or dislocation.  Soft tissues are intact. Joint spaces are maintained.  Normal bone mineralization.  No joint effusion.  IMPRESSION: No acute bony abnormality.   Original Report Authenticated By: Cyndie Chime, M.D.      1. Knee pain, left       MDM  10:51 AM Patient will have left knee xray and tylenol for pain.   12:01 PM Xray unremarkable for fracture and effusion. No signs of neurovascular compromise. Patient reports relief with tylenol. Patient will follow up with Dr. Dion Saucier for further evaluation. Patient will have knee brace before leaving.         Emilia Beck, PA-C 11/23/11 1656

## 2011-11-23 NOTE — ED Notes (Signed)
Pt states she did a charity walk last week and has had pain and a "popping sensation" in her left knee since.

## 2011-11-24 NOTE — ED Provider Notes (Signed)
Medical screening examination/treatment/procedure(s) were performed by non-physician practitioner and as supervising physician I was immediately available for consultation/collaboration.  Laylamarie Meuser, MD 11/24/11 1524 

## 2011-12-13 ENCOUNTER — Emergency Department (HOSPITAL_COMMUNITY)
Admission: EM | Admit: 2011-12-13 | Discharge: 2011-12-13 | Discharge status: Against Medical Advice | Payer: Medicaid Other | Source: Home / Self Care | Attending: Emergency Medicine | Admitting: Emergency Medicine

## 2011-12-13 NOTE — ED Notes (Signed)
Per Registration Patient stated she had something else more pressing to attend to and left without being seen

## 2012-11-05 ENCOUNTER — Encounter (HOSPITAL_COMMUNITY): Payer: Self-pay | Admitting: Emergency Medicine

## 2012-11-05 ENCOUNTER — Emergency Department (HOSPITAL_COMMUNITY)
Admission: EM | Admit: 2012-11-05 | Discharge: 2012-11-05 | Disposition: A | Discharge status: Home / Self Care | Payer: Medicaid Other | Source: Home / Self Care | Attending: Family Medicine | Admitting: Family Medicine

## 2012-11-05 DIAGNOSIS — M545 Low back pain, unspecified: Secondary | ICD-10-CM

## 2012-11-05 MED ORDER — CYCLOBENZAPRINE HCL 10 MG PO TABS: 10.0000 mg | ORAL_TABLET | Freq: Three times a day (TID) | ORAL | Status: DC | PRN

## 2012-11-05 MED ORDER — TRAMADOL HCL 50 MG PO TABS: 50.0000 mg | ORAL_TABLET | Freq: Four times a day (QID) | ORAL | Status: DC | PRN

## 2012-11-05 NOTE — ED Provider Notes (Signed)
Medical screening examination/treatment/procedure(s) were performed by a resident physician and as supervising physician I was immediately available for consultation/collaboration.  Leslee Home, M.D.  Reuben Likes, MD 11/05/12 2013

## 2012-11-05 NOTE — ED Notes (Signed)
C/o neck and low back pain off and on for past 4 years.  Pain worse in the last 5 days.  Wakes up at night and vomits from the pain.  Has not slept for 5 days.  Taking Ibuprofen 800 mg. Warm and cool compresses.

## 2012-11-05 NOTE — ED Provider Notes (Signed)
CSN: [id number]     Arrival date & time 11/05/12  1810 History   First MD Initiated Contact with Patient 11/05/12 1857     Chief Complaint  Patient presents with  . Back Pain  . Neck Pain   (Consider location/radiation/quality/duration/timing/severity/associated sxs/prior Treatment) HPI Comments: No recent trauma or falls, patient does not know why pain associated over the last 5 days  Patient is a 27 y.o. female presenting with back pain. The history is provided by the patient.  Back Pain Location:  Generalized Quality:  Stabbing and aching Radiates to:  Does not radiate Pain severity:  Severe Pain is:  Worse during the night Onset quality:  Gradual Duration:  5 days (on chronic patient with back pain for 3-4 years) Timing:  Constant Progression:  Worsening Chronicity:  New Context: not falling, not jumping from heights, not lifting heavy objects and not recent injury   Relieved by:  Nothing Worsened by:  Movement and sitting Ineffective treatments:  Ibuprofen, cold packs, NSAIDs and heating pad Associated symptoms: no leg pain, no numbness and no paresthesias   Risk factors: obesity   Risk factors: no hx of cancer and no hx of osteoporosis     Past Medical History  Diagnosis Date  . Kidney stones   . Panic attack   . Kidney stones   . Ovarian cyst   . Headache(784.0)   . Migraine   . History of renal stone   . BV (bacterial vaginosis)   . History of kidney stones   . Cyst   . Abnormal Pap smear   . Lactating mother 07/04/2011  . Postpartum hypertension 07/04/2011    D/c home 07/04/11 after Normal 24 hr urine=219mg  total protein.  No medications for BP during hospitalization or for d/c.   [facility] RN ordered for home visit middle of the week to check BP and pt's status.   Past Surgical History  Procedure Laterality Date  . Cesarean section  03/2009  . Wisdom tooth extraction  2008, 02/2010  . Cesarean section  06/24/2011    Procedure: CESAREAN SECTION;  Surgeon:  Esmeralda Arthur, MD;  Location: [facility];  Service: Gynecology;  Laterality: N/A;  . Laparotomy  06/25/2011    Procedure: EXPLORATORY LAPAROTOMY; Repair ileus  Surgeon: Purcell Nails, MD;  Location: [facility];  Service: Gynecology;  Laterality: N/A;    Family History  Problem Relation Age of Onset  . Hypotension Neg Hx   . Anesthesia problems Neg Hx   . Malignant hyperthermia Neg Hx   . Pseudochol deficiency Neg Hx   . Hypertension Mother   . Thrombophlebitis Mother   . Anemia Mother   . Heart disease Father   . Hypertension Father   . Diabetes Father   . Kidney disease Father   . Sickle cell trait Sister   . Asperger's syndrome Brother   . Lupus Maternal Aunt   . Lupus Paternal Aunt   . Heart attack Maternal Grandmother   . Thrombophlebitis Maternal Grandmother   . Transient ischemic attack Maternal Grandmother   . Heart disease Maternal Grandmother     MI  . Alzheimer's disease Maternal Grandfather   . Bipolar disorder Maternal Grandfather   . Heart murmur Maternal Grandfather   . Mental illness Maternal Grandfather    History  Substance Use Topics  . Smoking status: Never Smoker   . Smokeless tobacco: Never Used  . Alcohol Use: No   OB History   Grav Para Term Preterm  Abortions TAB SAB Ect Mult Living   2 2 2       2      Review of Systems  Constitutional: Negative.   HENT: Negative.   Gastrointestinal: Negative.   Musculoskeletal: Positive for back pain and neck pain. Negative for myalgias.  Neurological: Negative for numbness and paresthesias.       Patient denies any perineal numbness or weakness of her lower extremities and no radiating pain, denies any weakness of her upper extremities or loss of sensation of her hands or arms    Allergies  Review of patient's allergies indicates no known allergies.  Home Medications   Current Outpatient Rx  Name  Route  Sig  Dispense  Refill  . ibuprofen (ADVIL,MOTRIN) 800 MG tablet   Oral   Take 800 mg by mouth every  8 (eight) hours as needed for pain.         Marland Kitchen acetaminophen (TYLENOL) 500 MG tablet   Oral   Take 1 tablet (500 mg total) by mouth every 6 (six) hours as needed for pain.   30 tablet   0   . Acetaminophen 500 MG/5ML LIQD   Oral   Take 10 mLs (1,000 mg total) by mouth every 6 (six) hours as needed (for pain).   300 mL   2   . cyclobenzaprine (FLEXERIL) 10 MG tablet   Oral   Take 1 tablet (10 mg total) by mouth 3 (three) times daily as needed for muscle spasms.   30 tablet   0   . ibuprofen (ADVIL,MOTRIN) 100 MG/5ML suspension   Oral   Take 30 mLs (600 mg total) by mouth every 6 (six) hours as needed for pain.   840 mL   6   . OVER THE COUNTER MEDICATION   Oral   Take 2 tablets by mouth daily. Patient takes prenatal gummies         . oxyCODONE (ROXICODONE) 5 MG/5ML solution   Oral   Take 5 mLs (5 mg total) by mouth every 4 (four) hours as needed for pain.   50 mL   0   . traMADol (ULTRAM) 50 MG tablet   Oral   Take 1 tablet (50 mg total) by mouth every 6 (six) hours as needed for pain.   30 tablet   0    BP 104/79  Pulse 78  Temp(Src) 98 F (36.7 C) (Oral)  Resp 17  SpO2 99%  LMP 10/04/2012  Breastfeeding? No Physical Exam  Constitutional: She appears well-developed and well-nourished. No distress.  HENT:  Head: Normocephalic and atraumatic.  Musculoskeletal: She exhibits tenderness.       Cervical back: She exhibits decreased range of motion and tenderness. She exhibits no bony tenderness and no swelling.       Lumbar back: She exhibits tenderness. She exhibits no bony tenderness, no swelling and no deformity.  Neurological: She has normal strength and normal reflexes.  Negative straight leg raise bilaterally, negative Spurling's test bilaterally  Skin: She is not diaphoretic.    ED Course  Procedures (including critical care time) Labs Review Labs Reviewed - No data to display Imaging Review No results found.    MDM   1. Lumbago without  sciatica    Patient with acute on chronic low back pain and loots of paraspinal muscle tenderness without any evidence of neurologic deficit on exam. No imaging is warranted at this time. Patient will likely have relief from muscle relaxer and mild pain medication.  She was charted to follow up primary care physician for evaluation of this pain. She is agreeable to this plan.   Garnetta Buddy, MD 11/05/12 2008

## 2012-12-01 ENCOUNTER — Emergency Department (HOSPITAL_COMMUNITY)
Admission: EM | Admit: 2012-12-01 | Discharge: 2012-12-01 | Disposition: A | Discharge status: Home / Self Care | Payer: Medicaid Other | Source: Home / Self Care | Attending: Emergency Medicine | Admitting: Emergency Medicine

## 2012-12-01 ENCOUNTER — Encounter (HOSPITAL_COMMUNITY): Payer: Self-pay | Admitting: Emergency Medicine

## 2012-12-01 DIAGNOSIS — IMO0002 Reserved for concepts with insufficient information to code with codable children: Secondary | ICD-10-CM

## 2012-12-01 DIAGNOSIS — L02411 Cutaneous abscess of right axilla: Secondary | ICD-10-CM | POA: Diagnosis present

## 2012-12-01 NOTE — ED Provider Notes (Signed)
CSN: [id number]     Arrival date & time 12/01/12  1756 History   First MD Initiated Contact with Patient 12/01/12 1923     Chief Complaint  Patient presents with  . Recurrent Skin Infections   (Consider location/radiation/quality/duration/timing/severity/associated sxs/prior Treatment) HPI  27 year old F who presents with an abscess of right axillae. 3 days duration. Tender and swollen. No trauma, only shaving under her armpits. No fever, chills, nausea, vomiting, or diarrhea. Not taking anything for pain. No history of abscess or skin infection.   Past Medical History  Diagnosis Date  . Kidney stones   . Panic attack   . Kidney stones   . Ovarian cyst   . Headache(784.0)   . Migraine   . History of renal stone   . BV (bacterial vaginosis)   . History of kidney stones   . Cyst   . Abnormal Pap smear   . Lactating mother 07/04/2011  . Postpartum hypertension 07/04/2011    D/c home 07/04/11 after Normal 24 hr urine=219mg  total protein.  No medications for BP during hospitalization or for d/c.   Smart Start RN ordered for home visit middle of the week to check BP and pt's status.   Past Surgical History  Procedure Laterality Date  . Cesarean section  03/2009  . Wisdom tooth extraction  2008, 02/2010  . Cesarean section  06/24/2011    Procedure: CESAREAN SECTION;  Surgeon: Esmeralda Arthur, MD;  Location: [facility];  Service: Gynecology;  Laterality: N/A;  . Laparotomy  06/25/2011    Procedure: EXPLORATORY LAPAROTOMY; Repair ileus  Surgeon: Purcell Nails, MD;  Location: [facility];  Service: Gynecology;  Laterality: N/A;    Family History  Problem Relation Age of Onset  . Hypotension Neg Hx   . Anesthesia problems Neg Hx   . Malignant hyperthermia Neg Hx   . Pseudochol deficiency Neg Hx   . Hypertension Mother   . Thrombophlebitis Mother   . Anemia Mother   . Heart disease Father   . Hypertension Father   . Diabetes Father   . Kidney disease Father   . Sickle cell trait Sister   .  Asperger's syndrome Brother   . Lupus Maternal Aunt   . Lupus Paternal Aunt   . Heart attack Maternal Grandmother   . Thrombophlebitis Maternal Grandmother   . Transient ischemic attack Maternal Grandmother   . Heart disease Maternal Grandmother     MI  . Alzheimer's disease Maternal Grandfather   . Bipolar disorder Maternal Grandfather   . Heart murmur Maternal Grandfather   . Mental illness Maternal Grandfather    History  Substance Use Topics  . Smoking status: Never Smoker   . Smokeless tobacco: Never Used  . Alcohol Use: No   OB History   Grav Para Term Preterm Abortions TAB SAB Ect Mult Living   2 2 2       2      Review of Systems See HPI Allergies  Review of patient's allergies indicates no known allergies.  Home Medications   Current Outpatient Rx  Name  Route  Sig  Dispense  Refill  . acetaminophen (TYLENOL) 500 MG tablet   Oral   Take 1 tablet (500 mg total) by mouth every 6 (six) hours as needed for pain.   30 tablet   0   . Acetaminophen 500 MG/5ML LIQD   Oral   Take 10 mLs (1,000 mg total) by mouth every 6 (six) hours as  needed (for pain).   300 mL   2   . cyclobenzaprine (FLEXERIL) 10 MG tablet   Oral   Take 1 tablet (10 mg total) by mouth 3 (three) times daily as needed for muscle spasms.   30 tablet   0   . ibuprofen (ADVIL,MOTRIN) 100 MG/5ML suspension   Oral   Take 30 mLs (600 mg total) by mouth every 6 (six) hours as needed for pain.   840 mL   6   . ibuprofen (ADVIL,MOTRIN) 800 MG tablet   Oral   Take 800 mg by mouth every 8 (eight) hours as needed for pain.         Marland Kitchen OVER THE COUNTER MEDICATION   Oral   Take 2 tablets by mouth daily. Patient takes prenatal gummies         . oxyCODONE (ROXICODONE) 5 MG/5ML solution   Oral   Take 5 mLs (5 mg total) by mouth every 4 (four) hours as needed for pain.   50 mL   0   . traMADol (ULTRAM) 50 MG tablet   Oral   Take 1 tablet (50 mg total) by mouth every 6 (six) hours as needed for  pain.   30 tablet   0    BP 101/69  Pulse 84  Temp(Src) 98 F (36.7 C) (Oral)  Resp 18  SpO2 97%  LMP 11/08/2012 Physical Exam Gen: obese young female, uncomfortable but not ill Right Axillae: tender, swollen, erythematous, fluctuance under the skin  ED Course  Procedures (including critical care time) Labs Review Labs Reviewed - No data to display Imaging Review No results found.    MDM   1. Abscess of right axilla    Treated with I&D. Culture sent. No complications needed.   Incision and Drainage Procedure Note  Pre-operative Diagnosis: abscess  Post-operative Diagnosis: normal  Indications: pain   Anesthesia: 1% lidocaine with epinephrine, 10 cc  Procedure Details  The procedure, risks and complications have been discussed in detail (including, but not limited to airway compromise, infection, bleeding) with the patient, and the patient has gave verbal consent to the procedure.  The skin was sterilely prepped and draped over the affected area in the usual fashion. After adequate local anesthesia, I&D with a #11 blade was performed on the right axilla. Purulent drainage: present; 100 mL of NS flush use. Wound packed with sterile wick.  The patient was observed until stable.  Findings: Purulent drainage sent for culture  EBL: 20 cc's  Drains: none  Condition: Tolerated procedure well   Complications: none.     Garnetta Buddy, MD 12/01/12 2010

## 2012-12-01 NOTE — ED Provider Notes (Signed)
Medical screening examination/treatment/procedure(s) were performed by a resident physician and as supervising physician I was immediately available for consultation/collaboration.  Leslee Home, M.D.  Reuben Likes, MD 12/01/12 2120

## 2012-12-01 NOTE — ED Notes (Signed)
C/o boil under right arm pit which has been there for three days. Warm compress 3-5 times a day.   Ibuprofen taking for pain

## 2012-12-02 ENCOUNTER — Telehealth (HOSPITAL_COMMUNITY): Payer: Self-pay | Admitting: Family Medicine

## 2012-12-02 MED ORDER — TRAMADOL HCL 50 MG PO TABS: 50.0000 mg | ORAL_TABLET | Freq: Four times a day (QID) | ORAL | Status: DC | PRN

## 2012-12-02 MED ORDER — DOXYCYCLINE HYCLATE 100 MG PO CAPS: 100.0000 mg | ORAL_CAPSULE | Freq: Two times a day (BID) | ORAL | Status: DC

## 2012-12-02 NOTE — ED Notes (Signed)
Call from patient, c/o she was having a lot of pain in her axilla, s/p I&D of yesterday. As provider who treated is not in clinic today, have discussed w Dr Denyse Amass, who authorized pain Rx and antibiotics after review of chart and preliminary culture report. Spoke w patient who verified there is "no way that I can be pregnant" advised we cannot treat w rx as written if she is pregnant . Husband to come in to pick up rx, and will provide photo ID

## 2012-12-02 NOTE — ED Notes (Signed)
Patient called back.  Pain worse.  She will return to clinic to pick up ABX and pain medicine.  Pt will talk to RN and confirm not pregnant or lactating prior to handing over medicine.    Rodolph Bong, MD 12/02/12 [phone]

## 2012-12-04 ENCOUNTER — Emergency Department (HOSPITAL_COMMUNITY)
Admission: EM | Admit: 2012-12-04 | Discharge: 2012-12-04 | Disposition: A | Discharge status: Home / Self Care | Payer: Medicaid Other | Source: Home / Self Care | Attending: Family Medicine | Admitting: Family Medicine

## 2012-12-04 ENCOUNTER — Encounter (HOSPITAL_COMMUNITY): Payer: Self-pay | Admitting: Emergency Medicine

## 2012-12-04 DIAGNOSIS — L02411 Cutaneous abscess of right axilla: Secondary | ICD-10-CM

## 2012-12-04 DIAGNOSIS — Z4800 Encounter for change or removal of nonsurgical wound dressing: Secondary | ICD-10-CM

## 2012-12-04 LAB — CULTURE, ROUTINE-ABSCESS

## 2012-12-04 MED ORDER — ONDANSETRON 8 MG PO TBDP: 8.0000 mg | ORAL_TABLET | Freq: Three times a day (TID) | ORAL | Status: DC | PRN

## 2012-12-04 MED ORDER — ONDANSETRON 4 MG PO TBDP
8.0000 mg | ORAL_TABLET | Freq: Once | ORAL | Status: AC
  Administered 2012-12-04: 8 mg via ORAL

## 2012-12-04 MED ORDER — ONDANSETRON 4 MG PO TBDP
ORAL_TABLET | ORAL | Status: AC
  Filled 2012-12-04: qty 2

## 2012-12-04 NOTE — ED Notes (Signed)
Here for packing removal  Patient states she has been a lot nausea lately.

## 2012-12-04 NOTE — ED Provider Notes (Signed)
CSN: [id number]     Arrival date & time 12/04/12  0902 History   First MD Initiated Contact with Patient 12/04/12 [phone]     Chief Complaint  Patient presents with  . Abscess  . Nausea   (Consider location/radiation/quality/duration/timing/severity/associated sxs/prior Treatment) HPI Comments: 27 year old female presents for packing removal from a right axilla abscess that was incised and drained 3 days ago. She has been having nausea, and continued pain since the incision and drainage. She feels like this may be partly because her skin is very sensitive to the tape that was placed there. She was given a prescription for tramadol and doxycycline but didn't start taking it until last night. She denies chills, vomiting, dizziness or lightheadedness.  Patient is a 27 y.o. female presenting with abscess.  Abscess Associated symptoms: nausea   Associated symptoms: no fever and no vomiting     Past Medical History  Diagnosis Date  . Kidney stones   . Panic attack   . Kidney stones   . Ovarian cyst   . Headache(784.0)   . Migraine   . History of renal stone   . BV (bacterial vaginosis)   . History of kidney stones   . Cyst   . Abnormal Pap smear   . Lactating mother 07/04/2011  . Postpartum hypertension 07/04/2011    D/c home 07/04/11 after Normal 24 hr urine=219mg  total protein.  No medications for BP during hospitalization or for d/c.   Smart Start RN ordered for home visit middle of the week to check BP and pt's status.   Past Surgical History  Procedure Laterality Date  . Cesarean section  03/2009  . Wisdom tooth extraction  2008, 02/2010  . Cesarean section  06/24/2011    Procedure: CESAREAN SECTION;  Surgeon: Esmeralda Arthur, MD;  Location: [facility];  Service: Gynecology;  Laterality: N/A;  . Laparotomy  06/25/2011    Procedure: EXPLORATORY LAPAROTOMY; Repair ileus  Surgeon: Purcell Nails, MD;  Location: [facility];  Service: Gynecology;  Laterality: N/A;    Family History  Problem  Relation Age of Onset  . Hypotension Neg Hx   . Anesthesia problems Neg Hx   . Malignant hyperthermia Neg Hx   . Pseudochol deficiency Neg Hx   . Hypertension Mother   . Thrombophlebitis Mother   . Anemia Mother   . Heart disease Father   . Hypertension Father   . Diabetes Father   . Kidney disease Father   . Sickle cell trait Sister   . Asperger's syndrome Brother   . Lupus Maternal Aunt   . Lupus Paternal Aunt   . Heart attack Maternal Grandmother   . Thrombophlebitis Maternal Grandmother   . Transient ischemic attack Maternal Grandmother   . Heart disease Maternal Grandmother     MI  . Alzheimer's disease Maternal Grandfather   . Bipolar disorder Maternal Grandfather   . Heart murmur Maternal Grandfather   . Mental illness Maternal Grandfather    History  Substance Use Topics  . Smoking status: Never Smoker   . Smokeless tobacco: Never Used  . Alcohol Use: No   OB History   Grav Para Term Preterm Abortions TAB SAB Ect Mult Living   2 2 2       2      Review of Systems  Constitutional: Negative for fever and chills.  Eyes: Negative for visual disturbance.  Respiratory: Negative for cough and shortness of breath.   Cardiovascular: Negative for chest pain, palpitations and  leg swelling.  Gastrointestinal: Positive for nausea. Negative for vomiting and abdominal pain.  Endocrine: Negative for polydipsia and polyuria.  Genitourinary: Negative for dysuria, urgency and frequency.  Musculoskeletal: Negative for arthralgias and myalgias.  Skin: Positive for wound (right axilla). Negative for rash.  Neurological: Negative for dizziness, weakness and light-headedness.    Allergies  Review of patient's allergies indicates no known allergies.  Home Medications   Current Outpatient Rx  Name  Route  Sig  Dispense  Refill  . acetaminophen (TYLENOL) 500 MG tablet   Oral   Take 1 tablet (500 mg total) by mouth every 6 (six) hours as needed for pain.   30 tablet   0   .  Acetaminophen 500 MG/5ML LIQD   Oral   Take 10 mLs (1,000 mg total) by mouth every 6 (six) hours as needed (for pain).   300 mL   2   . cyclobenzaprine (FLEXERIL) 10 MG tablet   Oral   Take 1 tablet (10 mg total) by mouth 3 (three) times daily as needed for muscle spasms.   30 tablet   0   . doxycycline (VIBRAMYCIN) 100 MG capsule   Oral   Take 1 capsule (100 mg total) by mouth 2 (two) times daily.   20 capsule   0   . doxycycline (VIBRAMYCIN) 100 MG capsule   Oral   Take 1 capsule (100 mg total) by mouth 2 (two) times daily.   20 capsule   0   . ibuprofen (ADVIL,MOTRIN) 100 MG/5ML suspension   Oral   Take 30 mLs (600 mg total) by mouth every 6 (six) hours as needed for pain.   840 mL   6   . ibuprofen (ADVIL,MOTRIN) 800 MG tablet   Oral   Take 800 mg by mouth every 8 (eight) hours as needed for pain.         Marland Kitchen ondansetron (ZOFRAN-ODT) 8 MG disintegrating tablet   Oral   Take 1 tablet (8 mg total) by mouth every 8 (eight) hours as needed for nausea or vomiting.   10 tablet   0   . OVER THE COUNTER MEDICATION   Oral   Take 2 tablets by mouth daily. Patient takes prenatal gummies         . oxyCODONE (ROXICODONE) 5 MG/5ML solution   Oral   Take 5 mLs (5 mg total) by mouth every 4 (four) hours as needed for pain.   50 mL   0   . traMADol (ULTRAM) 50 MG tablet   Oral   Take 1 tablet (50 mg total) by mouth every 6 (six) hours as needed.   15 tablet   0   . traMADol (ULTRAM) 50 MG tablet   Oral   Take 1 tablet (50 mg total) by mouth every 6 (six) hours as needed.   15 tablet   0    BP 117/80  Pulse 79  Temp(Src) 99.3 F (37.4 C) (Oral)  Resp 18  SpO2 100%  LMP 11/08/2012 Physical Exam  Nursing note and vitals reviewed. Constitutional: She is oriented to person, place, and time. Vital signs are normal. She appears well-developed and well-nourished. No distress.  HENT:  Head: Normocephalic and atraumatic.  Pulmonary/Chest: Effort normal. No  respiratory distress.  Neurological: She is alert and oriented to person, place, and time. She has normal strength. Coordination normal.  Skin: Skin is warm and dry. Rash noted. She is not diaphoretic.  Abscess of the right axilla: Packed  with quarter inch gauze. Taped in place, gauze in place. Open, draining. There is a surrounding area of tender induration inferior to this. The skin is red and there is a single vesicle under where the tape was on her skin.  Psychiatric: She has a normal mood and affect. Judgment normal.    ED Course  Procedures (including critical care time) Labs Review Labs Reviewed - No data to display Imaging Review No results found.    MDM   1. Abscess of right axilla    Her pain was likely being treated to by her skin hypersensitivity reaction. Discontinuation of tape on the skin should solve this problem. Packing removed. The abscess cavity is shallow enough to where it does not need to be repacked. Warm compresses 4 times daily, use the doxycycline and tramadol, may also use ibuprofen when necessary. Followup if any worsening. Zofran for nausea   Meds ordered this encounter  Medications  . ondansetron (ZOFRAN-ODT) disintegrating tablet 8 mg    Sig:   . ondansetron (ZOFRAN-ODT) 8 MG disintegrating tablet    Sig: Take 1 tablet (8 mg total) by mouth every 8 (eight) hours as needed for nausea or vomiting.    Dispense:  10 tablet    Refill:  0    Order Specific Question:  Supervising Provider    Answer:  Bradd Canary D [[id number]]        Graylon Good, PA-C 12/04/12 [phone]

## 2012-12-04 NOTE — ED Provider Notes (Signed)
Medical screening examination/treatment/procedure(s) were performed by resident physician or non-physician practitioner and as supervising physician I was immediately available for consultation/collaboration.   Johathan Province DOUGLAS MD.   Aadith Raudenbush D Joliyah Lippens, MD 12/04/12 0946 

## 2012-12-05 MED ORDER — DOXYCYCLINE HYCLATE 100 MG PO CAPS: 100.0000 mg | ORAL_CAPSULE | Freq: Two times a day (BID) | ORAL | Status: DC

## 2012-12-06 NOTE — ED Notes (Signed)
I called pt. and left a message to call. Pt. called back. I reviewed the [facility] MRSA instructions with her.  Pt. asked if it can cause her stomach to hurt. I told her that it probably the Doxycycline and to take it with food.  She can also try an OTC probiotic.  If she vomits the medication- call back. Vassie Moselle 12/06/2012

## 2013-03-20 ENCOUNTER — Emergency Department (HOSPITAL_COMMUNITY)
Admission: EM | Admit: 2013-03-20 | Discharge: 2013-03-20 | Disposition: A | Discharge status: Home / Self Care | Payer: Medicaid Other | Attending: Emergency Medicine | Admitting: Emergency Medicine

## 2013-03-20 ENCOUNTER — Emergency Department (HOSPITAL_COMMUNITY): Payer: Medicaid Other

## 2013-03-20 ENCOUNTER — Encounter (HOSPITAL_COMMUNITY): Payer: Self-pay | Admitting: Emergency Medicine

## 2013-03-20 DIAGNOSIS — K5900 Constipation, unspecified: Secondary | ICD-10-CM

## 2013-03-20 DIAGNOSIS — Z8619 Personal history of other infectious and parasitic diseases: Secondary | ICD-10-CM | POA: Insufficient documentation

## 2013-03-20 DIAGNOSIS — R059 Cough, unspecified: Secondary | ICD-10-CM | POA: Insufficient documentation

## 2013-03-20 DIAGNOSIS — Z79899 Other long term (current) drug therapy: Secondary | ICD-10-CM | POA: Insufficient documentation

## 2013-03-20 DIAGNOSIS — Z8659 Personal history of other mental and behavioral disorders: Secondary | ICD-10-CM | POA: Insufficient documentation

## 2013-03-20 DIAGNOSIS — R05 Cough: Secondary | ICD-10-CM | POA: Insufficient documentation

## 2013-03-20 DIAGNOSIS — Z87442 Personal history of urinary calculi: Secondary | ICD-10-CM | POA: Insufficient documentation

## 2013-03-20 DIAGNOSIS — Z8742 Personal history of other diseases of the female genital tract: Secondary | ICD-10-CM | POA: Insufficient documentation

## 2013-03-20 DIAGNOSIS — Z3202 Encounter for pregnancy test, result negative: Secondary | ICD-10-CM | POA: Insufficient documentation

## 2013-03-20 DIAGNOSIS — Z8679 Personal history of other diseases of the circulatory system: Secondary | ICD-10-CM | POA: Insufficient documentation

## 2013-03-20 LAB — CBC WITH DIFFERENTIAL/PLATELET
Basophils Absolute: 0 10*3/uL (ref 0.0–0.1)
Basophils Relative: 1 % (ref 0–1)
Eosinophils Absolute: 0.1 10*3/uL (ref 0.0–0.7)
Eosinophils Relative: 2 % (ref 0–5)
HCT: 39 % (ref 36.0–46.0)
Hemoglobin: 13.6 g/dL (ref 12.0–15.0)
Lymphocytes Relative: 55 % — ABNORMAL HIGH (ref 12–46)
Lymphs Abs: 2.2 10*3/uL (ref 0.7–4.0)
MCH: 31.6 pg (ref 26.0–34.0)
MCHC: 34.9 g/dL (ref 30.0–36.0)
MCV: 90.5 fL (ref 78.0–100.0)
Monocytes Absolute: 0.3 10*3/uL (ref 0.1–1.0)
Monocytes Relative: 7 % (ref 3–12)
Neutro Abs: 1.5 10*3/uL — ABNORMAL LOW (ref 1.7–7.7)
Neutrophils Relative %: 36 % — ABNORMAL LOW (ref 43–77)
Platelets: 249 10*3/uL (ref 150–400)
RBC: 4.31 MIL/uL (ref 3.87–5.11)
RDW: 12 % (ref 11.5–15.5)
WBC: 4.1 10*3/uL (ref 4.0–10.5)

## 2013-03-20 LAB — COMPREHENSIVE METABOLIC PANEL
ALT: 20 U/L (ref 0–35)
AST: 27 U/L (ref 0–37)
Albumin: 4 g/dL (ref 3.5–5.2)
Alkaline Phosphatase: 52 U/L (ref 39–117)
BUN: 10 mg/dL (ref 6–23)
CO2: 23 mEq/L (ref 19–32)
Calcium: 9.6 mg/dL (ref 8.4–10.5)
Chloride: 103 mEq/L (ref 96–112)
Creatinine, Ser: 0.76 mg/dL (ref 0.50–1.10)
GFR calc Af Amer: >90 mL/min (ref >90)
GFR calc non Af Amer: >90 mL/min (ref >90)
Glucose, Bld: 91 mg/dL (ref 70–99)
Potassium: 4 mEq/L (ref 3.7–5.3)
Sodium: 139 mEq/L (ref 137–147)
Total Bilirubin: 0.5 mg/dL (ref 0.3–1.2)
Total Protein: 7.6 g/dL (ref 6.0–8.3)

## 2013-03-20 LAB — URINALYSIS, ROUTINE W REFLEX MICROSCOPIC
Bilirubin Urine: NEGATIVE
Glucose, UA: NEGATIVE mg/dL
Ketones, ur: NEGATIVE mg/dL
Leukocytes, UA: NEGATIVE
Nitrite: NEGATIVE
Protein, ur: NEGATIVE mg/dL
Specific Gravity, Urine: 1.019 (ref 1.005–1.030)
Urobilinogen, UA: 1 mg/dL (ref 0.0–1.0)
pH: 7.5 (ref 5.0–8.0)

## 2013-03-20 LAB — PREGNANCY, URINE: Preg Test, Ur: NEGATIVE

## 2013-03-20 LAB — URINE MICROSCOPIC-ADD ON

## 2013-03-20 MED ORDER — HYDROMORPHONE HCL PF 1 MG/ML IJ SOLN
1.0000 mg | Freq: Once | INTRAMUSCULAR | Status: AC
  Administered 2013-03-20: 1 mg via INTRAVENOUS
  Filled 2013-03-20: qty 1

## 2013-03-20 MED ORDER — PEG 3350-KCL-NABCB-NACL-NASULF 236 G PO SOLR: ORAL | Status: DC

## 2013-03-20 MED ORDER — HYDROCODONE-ACETAMINOPHEN 5-325 MG PO TABS: 1.0000 | ORAL_TABLET | Freq: Four times a day (QID) | ORAL | Status: DC | PRN

## 2013-03-20 MED ORDER — IOHEXOL 300 MG/ML  SOLN
25.0000 mL | INTRAMUSCULAR | Status: AC
  Administered 2013-03-20: 25 mL via ORAL

## 2013-03-20 MED ORDER — PROMETHAZINE HCL 25 MG PO TABS: 25.0000 mg | ORAL_TABLET | Freq: Four times a day (QID) | ORAL | Status: DC | PRN

## 2013-03-20 MED ORDER — PANTOPRAZOLE SODIUM 20 MG PO TBEC: 20.0000 mg | DELAYED_RELEASE_TABLET | Freq: Every day | ORAL | Status: DC

## 2013-03-20 MED ORDER — ONDANSETRON HCL 4 MG/2ML IJ SOLN
4.0000 mg | Freq: Once | INTRAMUSCULAR | Status: AC
  Administered 2013-03-20: 4 mg via INTRAVENOUS
  Filled 2013-03-20: qty 2

## 2013-03-20 MED ORDER — IOHEXOL 300 MG/ML  SOLN
100.0000 mL | Freq: Once | INTRAMUSCULAR | Status: AC | PRN
  Administered 2013-03-20: 100 mL via INTRAVENOUS

## 2013-03-20 NOTE — Discharge Instructions (Signed)
Follow up with a family md and return as needed

## 2013-03-20 NOTE — ED Provider Notes (Signed)
CSN: [id number]     Arrival date & time 03/20/13  0703 History   First MD Initiated Contact with Patient 03/20/13 [phone]     Chief Complaint  Patient presents with  . Constipation  . Cough     (Consider location/radiation/quality/duration/timing/severity/associated sxs/prior Treatment) Patient is a 28 y.o. female presenting with constipation and cough. The history is provided by the patient (the pt complains of abdominal pain and constipation).  Constipation Severity:  Mild Timing:  Constant Progression:  Worsening Chronicity:  Recurrent Context: dehydration   Context: not dietary changes   Stool description:  None produced Associated symptoms: no abdominal pain, no back pain and no diarrhea   Cough Associated symptoms: no chest pain, no eye discharge, no headaches and no rash     Past Medical History  Diagnosis Date  . Kidney stones   . Panic attack   . Kidney stones   . Ovarian cyst   . Headache(784.0)   . Migraine   . History of renal stone   . BV (bacterial vaginosis)   . History of kidney stones   . Cyst   . Abnormal Pap smear   . Lactating mother 07/04/2011  . Postpartum hypertension 07/04/2011    D/c home 07/04/11 after Normal 24 hr urine=219mg  total protein.  No medications for BP during hospitalization or for d/c.   Smart Start RN ordered for home visit middle of the week to check BP and pt's status.   Past Surgical History  Procedure Laterality Date  . Cesarean section  03/2009  . Wisdom tooth extraction  2008, 02/2010  . Cesarean section  06/24/2011    Procedure: CESAREAN SECTION;  Surgeon: Esmeralda Arthur, MD;  Location: [facility];  Service: Gynecology;  Laterality: N/A;  . Laparotomy  06/25/2011    Procedure: EXPLORATORY LAPAROTOMY; Repair ileus  Surgeon: Purcell Nails, MD;  Location: [facility];  Service: Gynecology;  Laterality: N/A;    Family History  Problem Relation Age of Onset  . Hypotension Neg Hx   . Anesthesia problems Neg Hx   . Malignant hyperthermia  Neg Hx   . Pseudochol deficiency Neg Hx   . Hypertension Mother   . Thrombophlebitis Mother   . Anemia Mother   . Heart disease Father   . Hypertension Father   . Diabetes Father   . Kidney disease Father   . Sickle cell trait Sister   . Asperger's syndrome Brother   . Lupus Maternal Aunt   . Lupus Paternal Aunt   . Heart attack Maternal Grandmother   . Thrombophlebitis Maternal Grandmother   . Transient ischemic attack Maternal Grandmother   . Heart disease Maternal Grandmother     MI  . Alzheimer's disease Maternal Grandfather   . Bipolar disorder Maternal Grandfather   . Heart murmur Maternal Grandfather   . Mental illness Maternal Grandfather    History  Substance Use Topics  . Smoking status: Never Smoker   . Smokeless tobacco: Never Used  . Alcohol Use: No   OB History   Grav Para Term Preterm Abortions TAB SAB Ect Mult Living   2 2 2       2      Review of Systems  Constitutional: Negative for appetite change and fatigue.  HENT: Negative for congestion, ear discharge and sinus pressure.   Eyes: Negative for discharge.  Respiratory: Positive for cough.   Cardiovascular: Negative for chest pain.  Gastrointestinal: Positive for constipation. Negative for abdominal pain and diarrhea.  Genitourinary:  Negative for frequency and hematuria.  Musculoskeletal: Negative for back pain.  Skin: Negative for rash.  Neurological: Negative for seizures and headaches.  Psychiatric/Behavioral: Negative for hallucinations.      Allergies  Review of patient's allergies indicates no known allergies.  Home Medications   Current Outpatient Rx  Name  Route  Sig  Dispense  Refill  . ibuprofen (ADVIL,MOTRIN) 800 MG tablet   Oral   Take 800 mg by mouth every 8 (eight) hours as needed for pain.         Marland Kitchen. HYDROcodone-acetaminophen (NORCO/VICODIN) 5-325 MG per tablet   Oral   Take 1 tablet by mouth every 6 (six) hours as needed for moderate pain.   20 tablet   0   .  pantoprazole (PROTONIX) 20 MG tablet   Oral   Take 1 tablet (20 mg total) by mouth daily.   20 tablet   0   . polyethylene glycol (GOLYTELY) 236 G solution      Drink one 8 ounce glass every hour until you have a bowel movement   4000 mL   0   . promethazine (PHENERGAN) 25 MG tablet   Oral   Take 1 tablet (25 mg total) by mouth every 6 (six) hours as needed for nausea or vomiting.   15 tablet   0    BP 117/75  Pulse 64  Temp(Src) 98.7 F (37.1 C) (Oral)  Resp 16  Ht 5\' 2"  (1.575 m)  Wt 155 lb (70.308 kg)  BMI 28.34 kg/m2  SpO2 100%  LMP 03/13/2013 Physical Exam  Constitutional: She is oriented to person, place, and time. She appears well-developed.  HENT:  Head: Normocephalic.  Eyes: Conjunctivae and EOM are normal. No scleral icterus.  Neck: Neck supple. No thyromegaly present.  Cardiovascular: Normal rate and regular rhythm.  Exam reveals no gallop and no friction rub.   No murmur heard. Pulmonary/Chest: No stridor. She has no wheezes. She has no rales. She exhibits no tenderness.  Abdominal: She exhibits distension. There is tenderness. There is no rebound.  Mild tenderness llq and rlq  Musculoskeletal: Normal range of motion. She exhibits no edema.  Lymphadenopathy:    She has no cervical adenopathy.  Neurological: She is oriented to person, place, and time. She exhibits normal muscle tone. Coordination normal.  Skin: No rash noted. No erythema.  Psychiatric: She has a normal mood and affect. Her behavior is normal.    ED Course  Procedures (including critical care time) Labs Review Labs Reviewed  CBC WITH DIFFERENTIAL - Abnormal; Notable for the following:    Neutrophils Relative % 36 (*)    Neutro Abs 1.5 (*)    Lymphocytes Relative 55 (*)    All other components within normal limits  URINALYSIS, ROUTINE W REFLEX MICROSCOPIC - Abnormal; Notable for the following:    APPearance HAZY (*)    Hgb urine dipstick LARGE (*)    All other components within  normal limits  URINE MICROSCOPIC-ADD ON - Abnormal; Notable for the following:    Squamous Epithelial / LPF FEW (*)    All other components within normal limits  COMPREHENSIVE METABOLIC PANEL  PREGNANCY, URINE   Imaging Review Ct Abdomen Pelvis W Contrast  03/20/2013   CLINICAL DATA:  28 year old female with pain nausea and 1 episode of vomiting. Initial encounter.  EXAM: CT ABDOMEN AND PELVIS WITH CONTRAST  TECHNIQUE: Multidetector CT imaging of the abdomen and pelvis was performed using the standard protocol following bolus administration of  intravenous contrast.  CONTRAST:  OMNIPAQUE IOHEXOL 300 MG/ML  SOLN  COMPARISON:  Abdomen radiographs 07/01/2011 and earlier. Abdomen ultrasound 11/30/2008.  FINDINGS: Negative lung bases.  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 No SI joint erosion identified. Otherwise no osseous abnormality identified.  No pelvic free fluid. Unremarkable bladder. Negative uterus and adnexa. Redundant but otherwise negative distal colon. Negative left colon. The splenic flexure and transverse colon also are redundant with retained stool. Mildly redundant right colon. Normal terminal ileum. Normal appendix (coronal image 35). No dilated small bowel. Oral contrast distends the stomach, has not yet emptied into the small bowel.  Negative liver, gallbladder, spleen, pancreas and adrenal glands. Both kidneys and proximal ureters appear normal. Major arterial structures throughout the abdomen and pelvis are patent. No abdominal free fluid. Splenic vein and main portal vein are patent. There is little SMV contrast. No lymphadenopathy identified.  IMPRESSION: No acute or inflammatory process identified in the abdomen or pelvis. Normal appendix.   Electronically Signed   By: Augusto Gamble M.D.   On: 03/20/2013 11:10   Dg Chest Port 1 View  03/20/2013   CLINICAL DATA:  Cough, mid chest pain, and sore throat.  EXAM:  PORTABLE CHEST - 1 VIEW  COMPARISON:  DG CHEST 2 VIEW dated 11/09/2006  FINDINGS: In the lungs are well-expanded and clear. The cardiac silhouette is normal in size. The mediastinum is normal in width. The pulmonary vascularity is not engorged. There is no pleural effusion. The observed portions of the bony thorax appear normal.  IMPRESSION: There is no evidence of active cardiopulmonary disease.   Electronically Signed   By: David  [place]   On: 03/20/2013 09:00    EKG Interpretation   None       MDM   Final diagnoses:  Constipation        Benny Lennert, MD 03/20/13 [phone]

## 2013-03-20 NOTE — ED Notes (Signed)
Family at bedside. 

## 2013-03-20 NOTE — ED Notes (Signed)
Pt presents via POV from home with constipation for 1 1/2 weeks, with nausea and one episode of emesis.  Pt is also complaining of a persistent cough for 2-3 days.

## 2013-03-24 ENCOUNTER — Emergency Department (HOSPITAL_COMMUNITY): Payer: Medicaid Other

## 2013-03-24 ENCOUNTER — Emergency Department (HOSPITAL_COMMUNITY)
Admission: EM | Admit: 2013-03-24 | Discharge: 2013-03-24 | Disposition: A | Discharge status: Home / Self Care | Payer: Medicaid Other | Attending: Emergency Medicine | Admitting: Emergency Medicine

## 2013-03-24 ENCOUNTER — Encounter (HOSPITAL_COMMUNITY): Payer: Self-pay | Admitting: Emergency Medicine

## 2013-03-24 DIAGNOSIS — N760 Acute vaginitis: Secondary | ICD-10-CM | POA: Insufficient documentation

## 2013-03-24 DIAGNOSIS — Z79899 Other long term (current) drug therapy: Secondary | ICD-10-CM | POA: Insufficient documentation

## 2013-03-24 DIAGNOSIS — B9689 Other specified bacterial agents as the cause of diseases classified elsewhere: Secondary | ICD-10-CM | POA: Insufficient documentation

## 2013-03-24 DIAGNOSIS — R110 Nausea: Secondary | ICD-10-CM

## 2013-03-24 DIAGNOSIS — R143 Flatulence: Secondary | ICD-10-CM

## 2013-03-24 DIAGNOSIS — R51 Headache: Secondary | ICD-10-CM | POA: Insufficient documentation

## 2013-03-24 DIAGNOSIS — R141 Gas pain: Secondary | ICD-10-CM | POA: Insufficient documentation

## 2013-03-24 DIAGNOSIS — R109 Unspecified abdominal pain: Secondary | ICD-10-CM

## 2013-03-24 DIAGNOSIS — Z87442 Personal history of urinary calculi: Secondary | ICD-10-CM | POA: Insufficient documentation

## 2013-03-24 DIAGNOSIS — K5900 Constipation, unspecified: Secondary | ICD-10-CM | POA: Insufficient documentation

## 2013-03-24 DIAGNOSIS — R142 Eructation: Secondary | ICD-10-CM | POA: Insufficient documentation

## 2013-03-24 DIAGNOSIS — G43109 Migraine with aura, not intractable, without status migrainosus: Secondary | ICD-10-CM | POA: Insufficient documentation

## 2013-03-24 DIAGNOSIS — R1033 Periumbilical pain: Secondary | ICD-10-CM | POA: Insufficient documentation

## 2013-03-24 DIAGNOSIS — N83209 Unspecified ovarian cyst, unspecified side: Secondary | ICD-10-CM | POA: Insufficient documentation

## 2013-03-24 DIAGNOSIS — A499 Bacterial infection, unspecified: Secondary | ICD-10-CM | POA: Insufficient documentation

## 2013-03-24 DIAGNOSIS — F410 Panic disorder [episodic paroxysmal anxiety] without agoraphobia: Secondary | ICD-10-CM | POA: Insufficient documentation

## 2013-03-24 LAB — CBC WITH DIFFERENTIAL/PLATELET
Basophils Absolute: 0 10*3/uL (ref 0.0–0.1)
Basophils Relative: 0 % (ref 0–1)
Eosinophils Absolute: 0.2 10*3/uL (ref 0.0–0.7)
Eosinophils Relative: 2 % (ref 0–5)
HCT: 38.5 % (ref 36.0–46.0)
Hemoglobin: 13.4 g/dL (ref 12.0–15.0)
Lymphocytes Relative: 33 % (ref 12–46)
Lymphs Abs: 2.5 10*3/uL (ref 0.7–4.0)
MCH: 31.5 pg (ref 26.0–34.0)
MCHC: 34.8 g/dL (ref 30.0–36.0)
MCV: 90.6 fL (ref 78.0–100.0)
Monocytes Absolute: 0.4 10*3/uL (ref 0.1–1.0)
Monocytes Relative: 5 % (ref 3–12)
Neutro Abs: 4.6 10*3/uL (ref 1.7–7.7)
Neutrophils Relative %: 60 % (ref 43–77)
Platelets: 318 10*3/uL (ref 150–400)
RBC: 4.25 MIL/uL (ref 3.87–5.11)
RDW: 11.9 % (ref 11.5–15.5)
WBC: 7.7 10*3/uL (ref 4.0–10.5)

## 2013-03-24 LAB — COMPREHENSIVE METABOLIC PANEL
ALT: 18 U/L (ref 0–35)
AST: 29 U/L (ref 0–37)
Albumin: 4 g/dL (ref 3.5–5.2)
Alkaline Phosphatase: 52 U/L (ref 39–117)
BUN: 11 mg/dL (ref 6–23)
CO2: 20 mEq/L (ref 19–32)
Calcium: 9.3 mg/dL (ref 8.4–10.5)
Chloride: 104 mEq/L (ref 96–112)
Creatinine, Ser: 0.72 mg/dL (ref 0.50–1.10)
GFR calc Af Amer: >90 mL/min (ref >90)
GFR calc non Af Amer: >90 mL/min (ref >90)
Glucose, Bld: 102 mg/dL — ABNORMAL HIGH (ref 70–99)
Potassium: 4.4 mEq/L (ref 3.7–5.3)
Sodium: 138 mEq/L (ref 137–147)
Total Bilirubin: 0.3 mg/dL (ref 0.3–1.2)
Total Protein: 7.6 g/dL (ref 6.0–8.3)

## 2013-03-24 LAB — URINALYSIS, ROUTINE W REFLEX MICROSCOPIC
Bilirubin Urine: NEGATIVE
Glucose, UA: NEGATIVE mg/dL
Ketones, ur: NEGATIVE mg/dL
Leukocytes, UA: NEGATIVE
Nitrite: NEGATIVE
Protein, ur: NEGATIVE mg/dL
Specific Gravity, Urine: 1.018 (ref 1.005–1.030)
Urobilinogen, UA: 0.2 mg/dL (ref 0.0–1.0)
pH: 6 (ref 5.0–8.0)

## 2013-03-24 LAB — URINE MICROSCOPIC-ADD ON

## 2013-03-24 LAB — PREGNANCY, URINE: Preg Test, Ur: NEGATIVE

## 2013-03-24 LAB — LIPASE, BLOOD: Lipase: 43 U/L (ref 11–59)

## 2013-03-24 MED ORDER — LANSOPRAZOLE 30 MG PO CPDR: 30.0000 mg | DELAYED_RELEASE_CAPSULE | Freq: Every day | ORAL | Status: DC

## 2013-03-24 MED ORDER — GI COCKTAIL ~~LOC~~
30.0000 mL | Freq: Once | ORAL | Status: AC
  Administered 2013-03-24: 30 mL via ORAL
  Filled 2013-03-24: qty 30

## 2013-03-24 MED ORDER — ONDANSETRON 4 MG PO TBDP: ORAL_TABLET | ORAL | Status: DC

## 2013-03-24 MED ORDER — POLYETHYLENE GLYCOL 3350 17 G PO PACK: 17.0000 g | PACK | Freq: Every day | ORAL | Status: DC

## 2013-03-24 MED ORDER — ONDANSETRON HCL 4 MG/2ML IJ SOLN: 4.0000 mg | Freq: Once | INTRAMUSCULAR | Status: DC

## 2013-03-24 MED ORDER — DICYCLOMINE HCL 10 MG/ML IM SOLN
20.0000 mg | Freq: Once | INTRAMUSCULAR | Status: AC
  Administered 2013-03-24: 20 mg via INTRAMUSCULAR
  Filled 2013-03-24: qty 2

## 2013-03-24 MED ORDER — TRAMADOL HCL 50 MG PO TABS: 50.0000 mg | ORAL_TABLET | Freq: Four times a day (QID) | ORAL | Status: DC | PRN

## 2013-03-24 MED ORDER — DOCUSATE SODIUM 100 MG PO CAPS: 100.0000 mg | ORAL_CAPSULE | Freq: Two times a day (BID) | ORAL | Status: DC

## 2013-03-24 MED ORDER — ONDANSETRON 4 MG PO TBDP
8.0000 mg | ORAL_TABLET | Freq: Once | ORAL | Status: AC
  Administered 2013-03-24: 8 mg via ORAL
  Filled 2013-03-24: qty 2

## 2013-03-24 MED ORDER — OXYCODONE-ACETAMINOPHEN 5-325 MG PO TABS
1.0000 | ORAL_TABLET | Freq: Once | ORAL | Status: AC
  Administered 2013-03-24: 1 via ORAL
  Filled 2013-03-24: qty 1

## 2013-03-24 NOTE — Discharge Instructions (Signed)
You may fill either the Protonix or Prevacid for acid reduction in the stomach. Take as prescribed. You may use the Zofran for nausea. Avoid all NSAIDs including ibuprofen and naproxen. Decrease amount of spicy and acidic foods. Call and make an appointment to followup with the gastroenterologist. Take stool softener and use MiraLax for constipation. Return immediately for worsening pain, persistent vomiting, blood in stool or vomit, fever or for any concerns.  Abdominal Pain, Women Abdominal (stomach, pelvic, or belly) pain can be caused by many things. It is important to tell your doctor:  The location of the pain.  Does it come and go or is it present all the time?  Are there things that start the pain (eating certain foods, exercise)?  Are there other symptoms associated with the pain (fever, nausea, vomiting, diarrhea)? All of this is helpful to know when trying to find the cause of the pain. CAUSES   Stomach: virus or bacteria infection, or ulcer.  Intestine: appendicitis (inflamed appendix), regional ileitis (Crohn's disease), ulcerative colitis (inflamed colon), irritable bowel syndrome, diverticulitis (inflamed diverticulum of the colon), or cancer of the stomach or intestine.  Gallbladder disease or stones in the gallbladder.  Kidney disease, kidney stones, or infection.  Pancreas infection or cancer.  Fibromyalgia (pain disorder).  Diseases of the female organs:  Uterus: fibroid (non-cancerous) tumors or infection.  Fallopian tubes: infection or tubal pregnancy.  Ovary: cysts or tumors.  Pelvic adhesions (scar tissue).  Endometriosis (uterus lining tissue growing in the pelvis and on the pelvic organs).  Pelvic congestion syndrome (female organs filling up with blood just before the menstrual period).  Pain with the menstrual period.  Pain with ovulation (producing an egg).  Pain with an IUD (intrauterine device, birth control) in the uterus.  Cancer of the  female organs.  Functional pain (pain not caused by a disease, may improve without treatment).  Psychological pain.  Depression. DIAGNOSIS  Your doctor will decide the seriousness of your pain by doing an examination.  Blood tests.  X-rays.  Ultrasound.  CT scan (computed tomography, special type of X-ray).  MRI (magnetic resonance imaging).  Cultures, for infection.  Barium enema (dye inserted in the large intestine, to better view it with X-rays).  Colonoscopy (looking in intestine with a lighted tube).  Laparoscopy (minor surgery, looking in abdomen with a lighted tube).  Major abdominal exploratory surgery (looking in abdomen with a large incision). TREATMENT  The treatment will depend on the cause of the pain.   Many cases can be observed and treated at home.  Over-the-counter medicines recommended by your caregiver.  Prescription medicine.  Antibiotics, for infection.  Birth control pills, for painful periods or for ovulation pain.  Hormone treatment, for endometriosis.  Nerve blocking injections.  Physical therapy.  Antidepressants.  Counseling with a psychologist or psychiatrist.  Minor or major surgery. HOME CARE INSTRUCTIONS   Do not take laxatives, unless directed by your caregiver.  Take over-the-counter pain medicine only if ordered by your caregiver. Do not take aspirin because it can cause an upset stomach or bleeding.  Try a clear liquid diet (broth or water) as ordered by your caregiver. Slowly move to a bland diet, as tolerated, if the pain is related to the stomach or intestine.  Have a thermometer and take your temperature several times a day, and record it.  Bed rest and sleep, if it helps the pain.  Avoid sexual intercourse, if it causes pain.  Avoid stressful situations.  Keep your follow-up  appointments and tests, as your caregiver orders.  If the pain does not go away with medicine or surgery, you may  try:  Acupuncture.  Relaxation exercises (yoga, meditation).  Group therapy.  Counseling. SEEK MEDICAL CARE IF:   You notice certain foods cause stomach pain.  Your home care treatment is not helping your pain.  You need stronger pain medicine.  You want your IUD removed.  You feel faint or lightheaded.  You develop nausea and vomiting.  You develop a rash.  You are having side effects or an allergy to your medicine. SEEK IMMEDIATE MEDICAL CARE IF:   Your pain does not go away or gets worse.  You have a fever.  Your pain is felt only in portions of the abdomen. The right side could possibly be appendicitis. The left lower portion of the abdomen could be colitis or diverticulitis.  You are passing blood in your stools (bright red or black tarry stools, with or without vomiting).  You have blood in your urine.  You develop chills, with or without a fever.  You pass out. MAKE SURE YOU:   Understand these instructions.  Will watch your condition.  Will get help right away if you are not doing well or get worse. Document Released: 11/09/2006 Document Revised: 04/06/2011 Document Reviewed: 11/29/2008 [facility] Patient Information 2014 [place], [place].

## 2013-03-24 NOTE — ED Provider Notes (Signed)
CSN: [id number]     Arrival date & time 03/24/13  0153 History   First MD Initiated Contact with Patient 03/24/13 0222     Chief Complaint  Patient presents with  . Constipation  . Abdominal Pain     (Consider location/radiation/quality/duration/timing/severity/associated sxs/prior Treatment) HPI Patient was seen 4 days ago for abdominal pain in the emergency department. She had a CT performed that showed that she had moderate stool accumulation especially in the right colon and transverse colon. She had a normal laboratory workup. She was started on GoLYTELY and states that she took up to 8 doses that evening before having a small bowel movement. Since that time she has had an ongoing abdominal cramping and inability to have a bowel movement. She's had nausea without vomiting. She states the nausea is worse at nights. She's been taking ibuprofen 800 mg frequently as well as hydrocodone. She did not get her PPI filled. She denies any fevers or chills. She's had no bleeding in the stool. She is passing gas. She describes the pain as coming in waves. Patient denies urinary symptoms. Past Medical History  Diagnosis Date  . Kidney stones   . Panic attack   . Kidney stones   . Ovarian cyst   . Headache(784.0)   . Migraine   . History of renal stone   . BV (bacterial vaginosis)   . History of kidney stones   . Cyst   . Abnormal Pap smear   . Lactating mother 07/04/2011  . Postpartum hypertension 07/04/2011    D/c home 07/04/11 after Normal 24 hr urine=219mg  total protein.  No medications for BP during hospitalization or for d/c.   Smart Start RN ordered for home visit middle of the week to check BP and pt's status.   Past Surgical History  Procedure Laterality Date  . Cesarean section  03/2009  . Wisdom tooth extraction  2008, 02/2010  . Cesarean section  06/24/2011    Procedure: CESAREAN SECTION;  Surgeon: Esmeralda Arthur, MD;  Location: [facility];  Service: Gynecology;  Laterality: N/A;  .  Laparotomy  06/25/2011    Procedure: EXPLORATORY LAPAROTOMY; Repair ileus  Surgeon: Purcell Nails, MD;  Location: [facility];  Service: Gynecology;  Laterality: N/A;    Family History  Problem Relation Age of Onset  . Hypotension Neg Hx   . Anesthesia problems Neg Hx   . Malignant hyperthermia Neg Hx   . Pseudochol deficiency Neg Hx   . Hypertension Mother   . Thrombophlebitis Mother   . Anemia Mother   . Heart disease Father   . Hypertension Father   . Diabetes Father   . Kidney disease Father   . Sickle cell trait Sister   . Asperger's syndrome Brother   . Lupus Maternal Aunt   . Lupus Paternal Aunt   . Heart attack Maternal Grandmother   . Thrombophlebitis Maternal Grandmother   . Transient ischemic attack Maternal Grandmother   . Heart disease Maternal Grandmother     MI  . Alzheimer's disease Maternal Grandfather   . Bipolar disorder Maternal Grandfather   . Heart murmur Maternal Grandfather   . Mental illness Maternal Grandfather    History  Substance Use Topics  . Smoking status: Never Smoker   . Smokeless tobacco: Never Used  . Alcohol Use: No   OB History   Grav Para Term Preterm Abortions TAB SAB Ect Mult Living   2 2 2        2  Review of Systems  Constitutional: Negative for fever and chills.  Respiratory: Negative for shortness of breath.   Cardiovascular: Negative for chest pain.  Gastrointestinal: Positive for nausea, abdominal pain, constipation and abdominal distention. Negative for vomiting, diarrhea and blood in stool.  Genitourinary: Negative for dysuria, flank pain, vaginal bleeding and vaginal discharge.  Musculoskeletal: Negative for back pain, myalgias, neck pain and neck stiffness.  Skin: Negative for rash and wound.  Neurological: Negative for dizziness, seizures, weakness, light-headedness, numbness and headaches.  All other systems reviewed and are negative.      Allergies  Review of patient's allergies indicates no known  allergies.  Home Medications   Current Outpatient Rx  Name  Route  Sig  Dispense  Refill  . HYDROcodone-acetaminophen (NORCO/VICODIN) 5-325 MG per tablet   Oral   Take 1 tablet by mouth every 6 (six) hours as needed for moderate pain.   20 tablet   0   . ibuprofen (ADVIL,MOTRIN) 800 MG tablet   Oral   Take 800 mg by mouth every 8 (eight) hours as needed for pain.         . medroxyPROGESTERone (DEPO-PROVERA) 150 MG/ML injection   Intramuscular   Inject 150 mg into the muscle every 3 (three) months.         . polyethylene glycol (GOLYTELY) 236 G solution      Drink one 8 ounce glass every hour until you have a bowel movement   4000 mL   0   . promethazine (PHENERGAN) 25 MG tablet   Oral   Take 1 tablet (25 mg total) by mouth every 6 (six) hours as needed for nausea or vomiting.   15 tablet   0   . pantoprazole (PROTONIX) 20 MG tablet   Oral   Take 1 tablet (20 mg total) by mouth daily.   20 tablet   0    BP 116/81  Pulse 80  Temp(Src) 98.3 F (36.8 C) (Oral)  Resp 18  Ht 5\' 2"  (1.575 m)  Wt 155 lb (70.308 kg)  BMI 28.34 kg/m2  SpO2 98%  LMP 03/13/2013 Physical Exam  Nursing note and vitals reviewed. Constitutional: She is oriented to person, place, and time. She appears well-developed and well-nourished. No distress.  HENT:  Head: Normocephalic and atraumatic.  Mouth/Throat: Oropharynx is clear and moist.  Eyes: EOM are normal. Pupils are equal, round, and reactive to light.  Neck: Normal range of motion. Neck supple.  Cardiovascular: Normal rate and regular rhythm.   Pulmonary/Chest: Effort normal and breath sounds normal. No respiratory distress. She has no wheezes. She has no rales.  Abdominal: Soft. She exhibits no distension and no mass. There is tenderness (epigastric tenderness to palpation.). There is no rebound and no guarding.  Increased bowel sounds  Musculoskeletal: Normal range of motion. She exhibits no edema and no tenderness.   Neurological: She is alert and oriented to person, place, and time.  Skin: Skin is warm and dry. No rash noted. No erythema.  Psychiatric: She has a normal mood and affect. Her behavior is normal.    ED Course  Procedures (including critical care time) Labs Review Labs Reviewed  COMPREHENSIVE METABOLIC PANEL - Abnormal; Notable for the following:    Glucose, Bld 102 (*)    All other components within normal limits  URINALYSIS, ROUTINE W REFLEX MICROSCOPIC - Abnormal; Notable for the following:    APPearance CLOUDY (*)    Hgb urine dipstick SMALL (*)    All other  components within normal limits  CBC WITH DIFFERENTIAL  PREGNANCY, URINE  LIPASE, BLOOD  URINE MICROSCOPIC-ADD ON   Imaging Review Dg Abd Acute W/chest  03/24/2013   CLINICAL DATA:  Lower abdominal pain and constipation.  EXAM: ACUTE ABDOMEN SERIES (ABDOMEN 2 VIEW & CHEST 1 VIEW)  COMPARISON:  Chest radiograph and CT of the abdomen and pelvis performed 03/20/2013  FINDINGS: The lungs are well-aerated and clear. There is no evidence of focal opacification, pleural effusion or pneumothorax. The cardiomediastinal silhouette is within normal limits.  The visualized bowel gas pattern is unremarkable. Scattered stool and air are seen within the colon; there is no evidence of small bowel dilatation to suggest obstruction. No free intra-abdominal air is identified on the provided upright view.  No acute osseous abnormalities are seen; the sacroiliac joints are unremarkable in appearance.  IMPRESSION: 1. Unremarkable bowel gas pattern; no free intra-abdominal air seen. Small to moderate amount of stool noted in the colon, without radiographic evidence for constipation. 2. No acute cardiopulmonary process seen.   Electronically Signed   By: Roanna RaiderJeffery  Chang M.D.   On: 03/24/2013 03:51    EKG Interpretation   None       MDM   Final diagnoses:  None    Reviewed the patient's CT scan. Increased stool in the right and transverse  colon. Also concern for possible mild gastritis do to the patient's frequent ibuprofen use. She does seem to be particularly tender in the epigastrium. This may account for her nausea especially at nights. We'll treat with GI cocktail. Abdominal series obtained to rule out any evidence of obstruction.  Patient is resting comfortably. She states she still has pain in the epigastric region. Treat with pain medication and discharged home. Patient's she is to followup with gastroenterologist. She is to avoid NSAIDs. Return precautions given  Loren Raceravid Samauri Kellenberger, MD 03/24/13 [phone]

## 2013-03-24 NOTE — ED Notes (Signed)
Severe abd pain sine Tuesday. Did take go lyte Tuesday to make pt. Have bm, which was x 1 sm. No bm sinc 2/11/1

## 2013-03-24 NOTE — ED Notes (Signed)
[place], MD at bedside. 

## 2013-11-27 ENCOUNTER — Encounter (HOSPITAL_COMMUNITY): Payer: Self-pay | Admitting: Emergency Medicine

## 2015-03-14 DIAGNOSIS — N644 Mastodynia: Secondary | ICD-10-CM | POA: Diagnosis not present

## 2015-04-02 ENCOUNTER — Other Ambulatory Visit: Payer: Self-pay | Admitting: Occupational Medicine

## 2015-04-02 ENCOUNTER — Ambulatory Visit: Payer: Medicaid Other

## 2015-04-02 DIAGNOSIS — M79672 Pain in left foot: Secondary | ICD-10-CM

## 2015-04-02 DIAGNOSIS — M25572 Pain in left ankle and joints of left foot: Secondary | ICD-10-CM

## 2015-04-03 ENCOUNTER — Other Ambulatory Visit: Payer: Self-pay | Admitting: Family Medicine

## 2015-04-03 DIAGNOSIS — N644 Mastodynia: Secondary | ICD-10-CM

## 2015-04-05 ENCOUNTER — Other Ambulatory Visit: Payer: Self-pay

## 2015-04-08 ENCOUNTER — Ambulatory Visit
Admission: RE | Admit: 2015-04-08 | Discharge: 2015-04-08 | Disposition: A | Discharge status: Home / Self Care | Payer: 59 | Source: Ambulatory Visit | Attending: Family Medicine | Admitting: Family Medicine

## 2015-04-08 ENCOUNTER — Other Ambulatory Visit: Payer: Self-pay | Admitting: Family Medicine

## 2015-04-08 DIAGNOSIS — N644 Mastodynia: Secondary | ICD-10-CM

## 2015-04-16 ENCOUNTER — Ambulatory Visit
Admission: RE | Admit: 2015-04-16 | Discharge: 2015-04-16 | Disposition: A | Discharge status: Home / Self Care | Payer: 59 | Source: Ambulatory Visit | Attending: Family Medicine | Admitting: Family Medicine

## 2015-04-16 ENCOUNTER — Other Ambulatory Visit: Payer: Self-pay | Admitting: Family Medicine

## 2015-04-16 DIAGNOSIS — N6012 Diffuse cystic mastopathy of left breast: Secondary | ICD-10-CM | POA: Diagnosis not present

## 2015-04-16 DIAGNOSIS — N63 Unspecified lump in breast: Secondary | ICD-10-CM | POA: Diagnosis not present

## 2015-04-16 DIAGNOSIS — N644 Mastodynia: Secondary | ICD-10-CM

## 2015-08-14 ENCOUNTER — Telehealth: Payer: Self-pay | Admitting: Advanced Practice Midwife

## 2015-08-14 NOTE — Telephone Encounter (Signed)
Message put in wrong chart

## 2015-10-02 DIAGNOSIS — N946 Dysmenorrhea, unspecified: Secondary | ICD-10-CM | POA: Diagnosis not present

## 2015-10-02 DIAGNOSIS — Z01419 Encounter for gynecological examination (general) (routine) without abnormal findings: Secondary | ICD-10-CM | POA: Diagnosis not present

## 2015-10-02 DIAGNOSIS — N920 Excessive and frequent menstruation with regular cycle: Secondary | ICD-10-CM | POA: Diagnosis not present

## 2015-10-02 DIAGNOSIS — Z130 Encounter for screening for diseases of the blood and blood-forming organs and certain disorders involving the immune mechanism: Secondary | ICD-10-CM | POA: Diagnosis not present

## 2015-10-02 DIAGNOSIS — Z6829 Body mass index (BMI) 29.0-29.9, adult: Secondary | ICD-10-CM | POA: Diagnosis not present

## 2015-10-02 DIAGNOSIS — Z1389 Encounter for screening for other disorder: Secondary | ICD-10-CM | POA: Diagnosis not present

## 2015-10-02 DIAGNOSIS — Z1151 Encounter for screening for human papillomavirus (HPV): Secondary | ICD-10-CM | POA: Diagnosis not present

## 2015-10-02 DIAGNOSIS — Z124 Encounter for screening for malignant neoplasm of cervix: Secondary | ICD-10-CM | POA: Diagnosis not present

## 2015-11-01 DIAGNOSIS — N920 Excessive and frequent menstruation with regular cycle: Secondary | ICD-10-CM | POA: Diagnosis not present

## 2015-11-01 DIAGNOSIS — N946 Dysmenorrhea, unspecified: Secondary | ICD-10-CM | POA: Diagnosis not present

## 2016-05-20 DIAGNOSIS — R109 Unspecified abdominal pain: Secondary | ICD-10-CM | POA: Diagnosis not present

## 2016-05-20 DIAGNOSIS — N926 Irregular menstruation, unspecified: Secondary | ICD-10-CM | POA: Diagnosis not present

## 2016-05-20 DIAGNOSIS — N920 Excessive and frequent menstruation with regular cycle: Secondary | ICD-10-CM | POA: Diagnosis not present

## 2016-05-22 ENCOUNTER — Encounter (HOSPITAL_COMMUNITY): Payer: Self-pay | Admitting: Family Medicine

## 2016-05-22 ENCOUNTER — Ambulatory Visit (HOSPITAL_COMMUNITY)
Admission: EM | Admit: 2016-05-22 | Discharge: 2016-05-22 | Disposition: A | Discharge status: Home / Self Care | Payer: 59 | Attending: Emergency Medicine | Admitting: Emergency Medicine

## 2016-05-22 DIAGNOSIS — H00012 Hordeolum externum right lower eyelid: Secondary | ICD-10-CM | POA: Diagnosis present

## 2016-05-22 MED ORDER — ERYTHROMYCIN 5 MG/GM OP OINT: TOPICAL_OINTMENT | OPHTHALMIC | 0 refills | Status: DC

## 2016-05-22 NOTE — Discharge Instructions (Signed)
Rest,warm compresses to right eye several times daily, Tylenol or ibuprofen as label directed for discomfort. Use eye ointment(erythromycin as directed). Follow up with Opthalmology, Bowen, or eye MD of your choice if issues persist-call for appt . Return to UC as needed. Wash hands before applying eye med or touching face.

## 2016-05-22 NOTE — ED Triage Notes (Signed)
Pt here for bump to right lower lid. sts she has been using warm compresses.

## 2016-05-22 NOTE — ED Provider Notes (Signed)
CSN: [id number]     Arrival date & time 05/22/16  1010 History   First MD Initiated Contact with Patient 05/22/16 1101     Chief Complaint  Patient presents with  . Eye Problem   (Consider location/radiation/quality/duration/timing/severity/associated sxs/prior Treatment) The history is provided by the patient. No language interpreter was used.  Eye Problem  Location:  Right eye Quality:  Burning Severity:  Moderate Onset quality:  Sudden Timing:  Constant Progression:  Worsening Chronicity:  New Context comment:  Rubbed right eye, works with pt, now sore bump to right lower inner lid   Past Medical History:  Diagnosis Date  . Abnormal Pap smear   . BV (bacterial vaginosis)   . Cyst   . Headache(784.0)   . History of kidney stones   . History of renal stone   . Kidney stones   . Kidney stones   . Lactating mother 07/04/2011  . Migraine   . Ovarian cyst   . Panic attack   . Postpartum hypertension 07/04/2011   D/c home 07/04/11 after Normal 24 hr urine=219mg  total protein.  No medications for BP during hospitalization or for d/c.   Smart Start RN ordered for home visit middle of the week to check BP and pt's status.   Past Surgical History:  Procedure Laterality Date  . CESAREAN SECTION  03/2009  . CESAREAN SECTION  06/24/2011   Procedure: CESAREAN SECTION;  Surgeon: Esmeralda Arthur, MD;  Location: [facility];  Service: Gynecology;  Laterality: N/A;  . LAPAROTOMY  06/25/2011   Procedure: EXPLORATORY LAPAROTOMY; Repair ileus  Surgeon: Purcell Nails, MD;  Location: [facility];  Service: Gynecology;  Laterality: N/A;   . WISDOM TOOTH EXTRACTION  2008, 02/2010   Family History  Problem Relation Age of Onset  . Hypertension Mother   . Thrombophlebitis Mother   . Anemia Mother   . Heart disease Father   . Hypertension Father   . Diabetes Father   . Kidney disease Father   . Sickle cell trait Sister   . Asperger's syndrome Brother   . Lupus Maternal Aunt   . Lupus Paternal Aunt     . Heart attack Maternal Grandmother   . Thrombophlebitis Maternal Grandmother   . Transient ischemic attack Maternal Grandmother   . Heart disease Maternal Grandmother     MI  . Alzheimer's disease Maternal Grandfather   . Bipolar disorder Maternal Grandfather   . Heart murmur Maternal Grandfather   . Mental illness Maternal Grandfather   . Hypotension Neg Hx   . Anesthesia problems Neg Hx   . Malignant hyperthermia Neg Hx   . Pseudochol deficiency Neg Hx    Social History  Substance Use Topics  . Smoking status: Never Smoker  . Smokeless tobacco: Never Used  . Alcohol use No   OB History    Gravida Para Term Preterm AB Living   SAB TAB Ectopic Multiple Live Births           2     Review of Systems  Eyes: Positive for pain.  All other systems reviewed and are negative.   Allergies  Patient has no known allergies.  Home Medications   Prior to Admission medications   Medication Sig Start Date End Date Taking? Authorizing Provider  docusate sodium (COLACE) 100 MG capsule Take 1 capsule (100 mg total) by mouth every 12 (twelve) hours. 03/24/13   Loren Racer, MD  erythromycin  ophthalmic ointment Place a 1/2 inch ribbon of ointment into the right lower eyelid every 6 hours x 5 days. 05/22/16   Clancy Gourd, NP  HYDROcodone-acetaminophen (NORCO/VICODIN) 5-325 MG per tablet Take 1 tablet by mouth every 6 (six) hours as needed for moderate pain. 03/20/13   Bethann Berkshire, MD  ibuprofen (ADVIL,MOTRIN) 800 MG tablet Take 800 mg by mouth every 8 (eight) hours as needed for pain.    Historical Provider, MD  lansoprazole (PREVACID) 30 MG capsule Take 1 capsule (30 mg total) by mouth daily at 12 noon. 03/24/13   Loren Racer, MD  medroxyPROGESTERone (DEPO-PROVERA) 150 MG/ML injection Inject 150 mg into the muscle every 3 (three) months.    Historical Provider, MD  ondansetron (ZOFRAN ODT) 4 MG disintegrating tablet  ODT q4 hours prn nausea/vomit 03/24/13   Loren Racer, MD  pantoprazole (PROTONIX) 20 MG tablet Take 1 tablet (20 mg total) by mouth daily. 03/20/13   Bethann Berkshire, MD  polyethylene glycol (GOLYTELY) 236 G solution Drink one 8 ounce glass every hour until you have a bowel movement 03/20/13   Bethann Berkshire, MD  polyethylene glycol [facility] / GLYCOLAX) packet Take 17 g by mouth daily. 03/24/13   Loren Racer, MD  promethazine (PHENERGAN) 25 MG tablet Take 1 tablet (25 mg total) by mouth every 6 (six) hours as needed for nausea or vomiting. 03/20/13   Bethann Berkshire, MD  traMADol (ULTRAM) 50 MG tablet Take 1 tablet (50 mg total) by mouth every 6 (six) hours as needed. 03/24/13   Loren Racer, MD   Meds Ordered and Administered this Visit  Medications - No data to display  BP 95/69   Pulse 70   Temp 98.5 F (36.9 C)   Resp 18   SpO2 98%  No data found.   Physical Exam  Constitutional: She is oriented to person, place, and time. Vital signs are normal. She appears well-developed and well-nourished. She is active and cooperative.  Eyes: Conjunctivae and EOM are normal. Pupils are equal, round, and reactive to light. Right eye exhibits hordeolum.    Neck: Trachea normal.  Cardiovascular: Normal rate.   Pulmonary/Chest: Effort normal.  Abdominal: Normal appearance.  Neurological: She is alert and oriented to person, place, and time.  Skin: Skin is warm and dry. No rash noted.  Psychiatric: She has a normal mood and affect. Her speech is normal and behavior is normal.  Nursing note and vitals reviewed.   Urgent Care Course     Procedures (including critical care time)  Labs Review Labs Reviewed - No data to display  Imaging Review No results found.        MDM   1. Hordeolum externum of right lower eyelid     1125: Discussed plan of care with pt: Rest,warm compresses to right eye several times daily, Tylenol or ibuprofen as label directed for discomfort. Use eye ointment(erythromycin as directed). Follow up with  Opthalmology, Bowen, or eye MD of your choice if issues persist-call for appt . Return to UC as needed. Wash hands before applying eye med or touching face. Pt verbalized understanding to this provider.    Clancy Gourd, NP 05/22/16 1352

## 2016-06-05 ENCOUNTER — Ambulatory Visit (HOSPITAL_COMMUNITY)
Admission: EM | Admit: 2016-06-05 | Discharge: 2016-06-05 | Disposition: A | Discharge status: Home / Self Care | Payer: 59 | Attending: Emergency Medicine | Admitting: Emergency Medicine

## 2016-06-05 ENCOUNTER — Encounter (HOSPITAL_COMMUNITY): Payer: Self-pay | Admitting: *Deleted

## 2016-06-05 DIAGNOSIS — R21 Rash and other nonspecific skin eruption: Secondary | ICD-10-CM | POA: Diagnosis not present

## 2016-06-05 MED ORDER — METHYLPREDNISOLONE ACETATE 80 MG/ML IJ SUSP
INTRAMUSCULAR | Status: AC
  Filled 2016-06-05: qty 1

## 2016-06-05 MED ORDER — TRIAMCINOLONE ACETONIDE 0.1 % EX CREA: 1.0000 "application " | TOPICAL_CREAM | Freq: Two times a day (BID) | CUTANEOUS | 0 refills | Status: DC

## 2016-06-05 MED ORDER — METHYLPREDNISOLONE ACETATE 80 MG/ML IJ SUSP
80.0000 mg | Freq: Once | INTRAMUSCULAR | Status: AC
  Administered 2016-06-05: 80 mg via INTRAMUSCULAR

## 2016-06-05 MED ORDER — PREDNISONE 10 MG PO TABS: ORAL_TABLET | ORAL | 0 refills | Status: DC

## 2016-06-05 NOTE — Discharge Instructions (Signed)
For your rash, you've been given a Depo-Medrol shot in clinic today, I have started you on a taper, starting tomorrow takes a taper as directed. Also given a topical cream you may use twice a day as needed, and I recommend over-the-counter Benadryl 50 mg every 6 hours for the next 2 days. If symptoms persist, or fail to resolve, return to clinic, or follow-up with your primary care provider, or go to a dermatologist.

## 2016-06-05 NOTE — ED Provider Notes (Signed)
CSN: [id number]     Arrival date & time 06/05/16  1614 History   None    Chief Complaint  Patient presents with  . Rash   (Consider location/radiation/quality/duration/timing/severity/associated sxs/prior Treatment) 31 year old female presents to clinic for evaluation of a rash on her chest, back, upper left arm. Patient works as a Engineer, civil (consulting)nurse, reports that the rash started after leaving a patient's room last night, and has worsened over the course of the night. The particular patient did not have a rash, or other dermatological symptoms. No other person in the family have similar symptoms   The history is provided by the patient.  Rash    Past Medical History:  Diagnosis Date  . Abnormal Pap smear   . BV (bacterial vaginosis)   . Cyst   . Headache(784.0)   . History of kidney stones   . History of renal stone   . Kidney stones   . Kidney stones   . Lactating mother 07/04/2011  . Migraine   . Ovarian cyst   . Panic attack   . Postpartum hypertension 07/04/2011   D/c home 07/04/11 after Normal 24 hr urine=219mg  total protein.  No medications for BP during hospitalization or for d/c.   Smart Start RN ordered for home visit middle of the week to check BP and pt's status.   Past Surgical History:  Procedure Laterality Date  . CESAREAN SECTION  03/2009  . CESAREAN SECTION  06/24/2011   Procedure: CESAREAN SECTION;  Surgeon: Esmeralda ArthurSandra A Rivard, MD;  Location: [facility];  Service: Gynecology;  Laterality: N/A;  . LAPAROTOMY  06/25/2011   Procedure: EXPLORATORY LAPAROTOMY; Repair ileus  Surgeon: Purcell NailsAngela Y Roberts, MD;  Location: [facility];  Service: Gynecology;  Laterality: N/A;   . WISDOM TOOTH EXTRACTION  2008, 02/2010   Family History  Problem Relation Age of Onset  . Hypertension Mother   . Thrombophlebitis Mother   . Anemia Mother   . Heart disease Father   . Hypertension Father   . Diabetes Father   . Kidney disease Father   . Sickle cell trait Sister   . Asperger's syndrome Brother   . Lupus  Maternal Aunt   . Lupus Paternal Aunt   . Heart attack Maternal Grandmother   . Thrombophlebitis Maternal Grandmother   . Transient ischemic attack Maternal Grandmother   . Heart disease Maternal Grandmother        MI  . Alzheimer's disease Maternal Grandfather   . Bipolar disorder Maternal Grandfather   . Heart murmur Maternal Grandfather   . Mental illness Maternal Grandfather   . Hypotension Neg Hx   . Anesthesia problems Neg Hx   . Malignant hyperthermia Neg Hx   . Pseudochol deficiency Neg Hx    Social History  Substance Use Topics  . Smoking status: Never Smoker  . Smokeless tobacco: Never Used  . Alcohol use No   OB History    Gravida Para Term Preterm AB Living   2 2 2     2    SAB TAB Ectopic Multiple Live Births           2     Review of Systems  Constitutional: Negative.   Respiratory: Negative.   Cardiovascular: Negative.   Skin: Positive for color change and rash.  Allergic/Immunologic: Negative for environmental allergies and food allergies.  Neurological: Negative.     Allergies  Patient has no active allergies.  Home Medications   Prior to Admission medications   Medication Sig  Start Date End Date Taking? Authorizing Provider  docusate sodium (COLACE) 100 MG capsule Take 1 capsule (100 mg total) by mouth every 12 (twelve) hours. 03/24/13   Loren Racer, MD  erythromycin ophthalmic ointment Place a 1/2 inch ribbon of ointment into the right lower eyelid every 6 hours x 5 days. 05/22/16   Defelice, Para March, NP  HYDROcodone-acetaminophen (NORCO/VICODIN) 5-325 MG per tablet Take 1 tablet by mouth every 6 (six) hours as needed for moderate pain. 03/20/13   Bethann Berkshire, MD  ibuprofen (ADVIL,MOTRIN) 800 MG tablet Take 800 mg by mouth every 8 (eight) hours as needed for pain.    [provider]  lansoprazole (PREVACID) 30 MG capsule Take 1 capsule (30 mg total) by mouth daily at 12 noon. 03/24/13   Loren Racer, MD  medroxyPROGESTERone  (DEPO-PROVERA) 150 MG/ML injection Inject 150 mg into the muscle every 3 (three) months.    [provider]  ondansetron (ZOFRAN ODT) 4 MG disintegrating tablet 4mg  ODT q4 hours prn nausea/vomit 03/24/13   Loren Racer, MD  pantoprazole (PROTONIX) 20 MG tablet Take 1 tablet (20 mg total) by mouth daily. 03/20/13   Bethann Berkshire, MD  polyethylene glycol (GOLYTELY) 236 G solution Drink one 8 ounce glass every hour until you have a bowel movement 03/20/13   Bethann Berkshire, MD  polyethylene glycol (MIRALAX / GLYCOLAX) packet Take 17 g by mouth daily. 03/24/13   Loren Racer, MD  predniSONE (DELTASONE) 10 MG tablet 4 tablets for 3 days 3 tablets for 3 days 2 tablets for 2 days 1 tablet for 1 day 06/05/16   Dorena Bodo, NP  promethazine (PHENERGAN) 25 MG tablet Take 1 tablet (25 mg total) by mouth every 6 (six) hours as needed for nausea or vomiting. 03/20/13   Bethann Berkshire, MD  traMADol (ULTRAM) 50 MG tablet Take 1 tablet (50 mg total) by mouth every 6 (six) hours as needed. 03/24/13   Loren Racer, MD  triamcinolone cream (KENALOG) 0.1 % Apply 1 application topically 2 (two) times daily. 06/05/16   Dorena Bodo, NP   Meds Ordered and Administered this Visit   Medications  methylPREDNISolone acetate (DEPO-MEDROL) injection 80 mg (80 mg Intramuscular Given 06/05/16 1651)    BP 107/65 (BP Location: Right Arm)   Pulse 78   Temp 98.6 F (37 C) (Oral)   Resp 18   LMP 05/18/2016   SpO2 99%  No data found.   Physical Exam  Constitutional: She is oriented to person, place, and time. She appears well-developed and well-nourished. No distress.  HENT:  Head: Normocephalic and atraumatic.  Right Ear: External ear normal.  Left Ear: External ear normal.  Eyes: Conjunctivae are normal. Right eye exhibits no discharge. Left eye exhibits no discharge.  Cardiovascular: Normal rate and regular rhythm.   Pulmonary/Chest: Effort normal and breath sounds normal.  Neurological:  She is alert and oriented to person, place, and time.  Skin: Skin is warm and dry. Capillary refill takes less than 2 seconds. Rash noted. Rash is papular. She is not diaphoretic. No erythema.  Multiple small, papular lesions, right arm, chest, and back.  Psychiatric: She has a normal mood and affect. Her behavior is normal.  Nursing note and vitals reviewed.   Urgent Care Course     Procedures (including critical care time)  Labs Review Labs Reviewed - No data to display  Imaging Review No results found.   MDM   1. Rash    Given injection of Depo-Medrol, along with steroid taper  for rash, encouraged take over-the-counter Benadryl, return to clinic as necessary if rash persists, worsens, or follow-up with a dermatologist as needed.     Dorena Bodo, NP 06/05/16 1904

## 2016-06-05 NOTE — ED Triage Notes (Signed)
Rash  On  Chest   And  Back

## 2016-06-15 DIAGNOSIS — K5900 Constipation, unspecified: Secondary | ICD-10-CM | POA: Diagnosis not present

## 2016-06-15 DIAGNOSIS — K625 Hemorrhage of anus and rectum: Secondary | ICD-10-CM | POA: Diagnosis not present

## 2016-07-09 DIAGNOSIS — L503 Dermatographic urticaria: Secondary | ICD-10-CM | POA: Diagnosis not present

## 2016-07-20 DIAGNOSIS — K5900 Constipation, unspecified: Secondary | ICD-10-CM | POA: Diagnosis not present

## 2016-07-23 ENCOUNTER — Encounter (HOSPITAL_COMMUNITY): Payer: Self-pay | Admitting: Emergency Medicine

## 2016-07-23 ENCOUNTER — Ambulatory Visit (HOSPITAL_COMMUNITY)
Admission: EM | Admit: 2016-07-23 | Discharge: 2016-07-23 | Disposition: A | Discharge status: Home / Self Care | Payer: 59

## 2016-07-23 ENCOUNTER — Ambulatory Visit (HOSPITAL_COMMUNITY)
Admission: EM | Admit: 2016-07-23 | Discharge: 2016-07-23 | Disposition: A | Discharge status: Home / Self Care | Payer: 59 | Attending: Internal Medicine | Admitting: Internal Medicine

## 2016-07-23 DIAGNOSIS — J020 Streptococcal pharyngitis: Secondary | ICD-10-CM | POA: Diagnosis not present

## 2016-07-23 LAB — POCT RAPID STREP A: Streptococcus, Group A Screen (Direct): POSITIVE — AB

## 2016-07-23 MED ORDER — ACETAMINOPHEN 325 MG PO TABS
ORAL_TABLET | ORAL | Status: AC
  Filled 2016-07-23: qty 2

## 2016-07-23 MED ORDER — PHENOL 1.4 % MT LIQD: 1.0000 | OROMUCOSAL | 0 refills | Status: DC | PRN

## 2016-07-23 MED ORDER — PENICILLIN V POTASSIUM 500 MG PO TABS: 500.0000 mg | ORAL_TABLET | Freq: Three times a day (TID) | ORAL | 0 refills | Status: DC

## 2016-07-23 MED ORDER — ACETAMINOPHEN 325 MG PO TABS
650.0000 mg | ORAL_TABLET | Freq: Once | ORAL | Status: AC
  Administered 2016-07-23: 650 mg via ORAL

## 2016-07-23 NOTE — ED Triage Notes (Signed)
Pt here for ST onset 1400 associated w/swelling of tonsils, weakness, odynophagia  Denies fevers, n/v/d, cold sx  A&O x4... NAD... Ambulatory

## 2016-07-23 NOTE — ED Provider Notes (Signed)
CSN: [id number]     Arrival date & time 07/23/16  1644 History   None    Chief Complaint  Patient presents with  . Sore Throat   (Consider location/radiation/quality/duration/timing/severity/associated sxs/prior Treatment) 31 yo female who comes in with few hours history of sore throat, feeling weak. Patient woke up this afternoon with sore throat, and generally feeling sick. She has exposure to strep throat from work and came for evaluation. Has not tried anything for it. Has trouble swallowing due to pain. Denies trouble breathing. Denies fever, chills. Denies N/V/D, abdominal pain. Some coughing. Some rhinorrhea. Denies ear/eye pain or discharge.       Past Medical History:  Diagnosis Date  . Abnormal Pap smear   . BV (bacterial vaginosis)   . Cyst   . Headache(784.0)   . History of kidney stones   . History of renal stone   . Kidney stones   . Kidney stones   . Lactating mother 07/04/2011  . Migraine   . Ovarian cyst   . Panic attack   . Postpartum hypertension 07/04/2011   D/c home 07/04/11 after Normal 24 hr urine=219mg  total protein.  No medications for BP during hospitalization or for d/c.   Smart Start RN ordered for home visit middle of the week to check BP and pt's status.   Past Surgical History:  Procedure Laterality Date  . CESAREAN SECTION  03/2009  . CESAREAN SECTION  06/24/2011   Procedure: CESAREAN SECTION;  Surgeon: Esmeralda Arthur, MD;  Location: [facility];  Service: Gynecology;  Laterality: N/A;  . LAPAROTOMY  06/25/2011   Procedure: EXPLORATORY LAPAROTOMY; Repair ileus  Surgeon: Purcell Nails, MD;  Location: [facility];  Service: Gynecology;  Laterality: N/A;   . WISDOM TOOTH EXTRACTION  2008, 02/2010   Family History  Problem Relation Age of Onset  . Hypertension Mother   . Thrombophlebitis Mother   . Anemia Mother   . Heart disease Father   . Hypertension Father   . Diabetes Father   . Kidney disease Father   . Sickle cell trait Sister   . Asperger's  syndrome Brother   . Lupus Maternal Aunt   . Lupus Paternal Aunt   . Heart attack Maternal Grandmother   . Thrombophlebitis Maternal Grandmother   . Transient ischemic attack Maternal Grandmother   . Heart disease Maternal Grandmother        MI  . Alzheimer's disease Maternal Grandfather   . Bipolar disorder Maternal Grandfather   . Heart murmur Maternal Grandfather   . Mental illness Maternal Grandfather   . Hypotension Neg Hx   . Anesthesia problems Neg Hx   . Malignant hyperthermia Neg Hx   . Pseudochol deficiency Neg Hx    Social History  Substance Use Topics  . Smoking status: Never Smoker  . Smokeless tobacco: Never Used  . Alcohol use No   OB History    Gravida Para Term Preterm AB Living   2 2 2     2    SAB TAB Ectopic Multiple Live Births           2     Review of Systems  Constitutional: Positive for fatigue. Negative for chills, diaphoresis and fever.  HENT: Positive for rhinorrhea, sore throat and trouble swallowing. Negative for congestion, ear discharge, ear pain, facial swelling, nosebleeds, postnasal drip, sinus pain, sinus pressure and sneezing.   Eyes: Negative for pain, discharge, redness and itching.  Respiratory: Positive for cough. Negative for choking,  shortness of breath and wheezing.   Cardiovascular: Negative for chest pain and palpitations.  Gastrointestinal: Negative for abdominal pain, diarrhea, nausea and vomiting.  Skin: Negative for rash.    Allergies  Patient has no known allergies.  Home Medications   Prior to Admission medications   Medication Sig Start Date End Date Taking? Authorizing Provider  ibuprofen (ADVIL,MOTRIN) 800 MG tablet Take 800 mg by mouth every 8 (eight) hours as needed for pain.   Yes [provider]  docusate sodium (COLACE) 100 MG capsule Take 1 capsule (100 mg total) by mouth every 12 (twelve) hours. 03/24/13   Loren Racer, MD  erythromycin ophthalmic ointment Place a 1/2 inch ribbon of ointment into  the right lower eyelid every 6 hours x 5 days. 05/22/16   Defelice, Para March, NP  HYDROcodone-acetaminophen (NORCO/VICODIN) 5-325 MG per tablet Take 1 tablet by mouth every 6 (six) hours as needed for moderate pain. 03/20/13   Bethann Berkshire, MD  lansoprazole (PREVACID) 30 MG capsule Take 1 capsule (30 mg total) by mouth daily at 12 noon. 03/24/13   Loren Racer, MD  medroxyPROGESTERone (DEPO-PROVERA) 150 MG/ML injection Inject 150 mg into the muscle every 3 (three) months.    [provider]  ondansetron (ZOFRAN ODT) 4 MG disintegrating tablet 4mg  ODT q4 hours prn nausea/vomit 03/24/13   Loren Racer, MD  pantoprazole (PROTONIX) 20 MG tablet Take 1 tablet (20 mg total) by mouth daily. 03/20/13   Bethann Berkshire, MD  penicillin v potassium (VEETID) 500 MG tablet Take 1 tablet (500 mg total) by mouth 3 (three) times daily. 07/23/16   Cathie Hoops, Torian Thoennes V, PA-C  phenol (CHLORASEPTIC) 1.4 % LIQD Use as directed 1 spray in the mouth or throat as needed for throat irritation / pain. 07/23/16   Cathie Hoops, Taliyah Watrous V, PA-C  polyethylene glycol (GOLYTELY) 236 G solution Drink one 8 ounce glass every hour until you have a bowel movement 03/20/13   Bethann Berkshire, MD  polyethylene glycol (MIRALAX / GLYCOLAX) packet Take 17 g by mouth daily. 03/24/13   Loren Racer, MD  predniSONE (DELTASONE) 10 MG tablet 4 tablets for 3 days 3 tablets for 3 days 2 tablets for 2 days 1 tablet for 1 day 06/05/16   Dorena Bodo, NP  promethazine (PHENERGAN) 25 MG tablet Take 1 tablet (25 mg total) by mouth every 6 (six) hours as needed for nausea or vomiting. 03/20/13   Bethann Berkshire, MD  traMADol (ULTRAM) 50 MG tablet Take 1 tablet (50 mg total) by mouth every 6 (six) hours as needed. 03/24/13   Loren Racer, MD  triamcinolone cream (KENALOG) 0.1 % Apply 1 application topically 2 (two) times daily. 06/05/16   Dorena Bodo, NP   Meds Ordered and Administered this Visit   Medications  acetaminophen (TYLENOL) tablet 650 mg (650  mg Oral Given 07/23/16 1721)    BP 104/73 (BP Location: Left Arm)   Pulse 92   Temp (!) 100.4 F (38 C) (Oral)   Resp 16   LMP 07/22/2016   SpO2 98%  No data found.   Physical Exam  Constitutional: She is oriented to person, place, and time. She appears well-developed and well-nourished. No distress.  HENT:  Head: Normocephalic and atraumatic.  Right Ear: Tympanic membrane, external ear and ear canal normal. Tympanic membrane is not erythematous and not bulging.  Left Ear: Tympanic membrane, external ear and ear canal normal. Tympanic membrane is not erythematous and not bulging.  Nose: Nose normal. Right sinus exhibits no maxillary sinus  tenderness and no frontal sinus tenderness. Left sinus exhibits no maxillary sinus tenderness and no frontal sinus tenderness.  Mouth/Throat: Uvula is midline and mucous membranes are normal. Posterior oropharyngeal edema and posterior oropharyngeal erythema present. No oropharyngeal exudate or tonsillar abscesses. Tonsils are 2+ on the right. Tonsils are 2+ on the left. No tonsillar exudate.  Eyes: Conjunctivae are normal. Pupils are equal, round, and reactive to light.  Cardiovascular: Normal rate and regular rhythm.  Exam reveals no gallop and no friction rub.   No murmur heard. Pulmonary/Chest: Effort normal and breath sounds normal. She has no wheezes. She has no rales.  Lymphadenopathy:    She has cervical adenopathy.  Neurological: She is alert and oriented to person, place, and time.  Skin: Skin is warm and dry.  Psychiatric: She has a normal mood and affect. Her behavior is normal. Judgment normal.    Urgent Care Course     Procedures (including critical care time)  Labs Review Labs Reviewed  POCT RAPID STREP A - Abnormal; Notable for the following:       Result Value   Streptococcus, Group A Screen (Direct) POSITIVE (*)    All other components within normal limits    Imaging Review No results found.       MDM   1.  Strep pharyngitis    1. Reviewed test results with patient, rapid strep positive. Start Penicillin VK 500mg  TID x 10 days. Side effects of medicine discussed with patient. Phenol for throat pain, and Tylenol for fever. Patient to discard toothbrush after 24 hrs of antibiotics use. Patient to return to work after 24 hrs of antibiotics, or 24 hrs free of fever. To monitor for trouble breathing, trouble swallowing due to throat swelling.    Belinda FisherYu, Yoav Okane V, PA-C 07/23/16 1738

## 2016-07-23 NOTE — Discharge Instructions (Signed)
Start Penicillin VK 500mg  TID x 10 days. Phenol for throat pain. Tylenol for fever. Discard your toothbrush after 24 hrs on antibiotics. You may return to work after 24 hrs on antibiotics, or 24 hrs fever free.

## 2016-08-14 MED FILL — IBUPROFEN 800 MG TAB: 800 | 15 days supply | Qty: 45 | Fill #0

## 2016-09-14 MED FILL — MONO-LINYAH 28 TABLET: 0.25-35 | 56 days supply | Qty: 56 | Fill #0

## 2016-10-22 DIAGNOSIS — N909 Noninflammatory disorder of vulva and perineum, unspecified: Secondary | ICD-10-CM | POA: Diagnosis not present

## 2016-10-22 DIAGNOSIS — N898 Other specified noninflammatory disorders of vagina: Secondary | ICD-10-CM | POA: Diagnosis not present

## 2016-10-22 DIAGNOSIS — Z202 Contact with and (suspected) exposure to infections with a predominantly sexual mode of transmission: Secondary | ICD-10-CM | POA: Diagnosis not present

## 2016-10-22 DIAGNOSIS — Z113 Encounter for screening for infections with a predominantly sexual mode of transmission: Secondary | ICD-10-CM | POA: Diagnosis not present

## 2016-10-22 MED FILL — valACYclovir HCL 1 GM TABS: 1 | 10 days supply | Qty: 20 | Fill #0

## 2016-10-26 MED FILL — VALACYCLOVIR HCL 500 MG TAB: 500 | 9 days supply | Qty: 18 | Fill #0

## 2016-11-10 MED FILL — NORG-ETHIN ESTRA 0.25-0.035: 0.25-35 | 84 days supply | Qty: 84 | Fill #0

## 2017-01-18 MED FILL — LINZESS 290 MCG CAPSULE: 290 | 30 days supply | Qty: 30 | Fill #0

## 2017-01-20 MED FILL — FEMYNOR 0.25-35 MG-MCG TABS: 0.25-35 | 84 days supply | Qty: 84 | Fill #0

## 2017-02-22 DIAGNOSIS — Z01419 Encounter for gynecological examination (general) (routine) without abnormal findings: Secondary | ICD-10-CM | POA: Diagnosis not present

## 2017-02-22 DIAGNOSIS — N644 Mastodynia: Secondary | ICD-10-CM | POA: Diagnosis not present

## 2017-02-22 DIAGNOSIS — N946 Dysmenorrhea, unspecified: Secondary | ICD-10-CM | POA: Diagnosis not present

## 2017-02-22 DIAGNOSIS — Z130 Encounter for screening for diseases of the blood and blood-forming organs and certain disorders involving the immune mechanism: Secondary | ICD-10-CM | POA: Diagnosis not present

## 2017-02-22 DIAGNOSIS — Z1389 Encounter for screening for other disorder: Secondary | ICD-10-CM | POA: Diagnosis not present

## 2017-02-22 DIAGNOSIS — Z3041 Encounter for surveillance of contraceptive pills: Secondary | ICD-10-CM | POA: Diagnosis not present

## 2017-02-22 DIAGNOSIS — N926 Irregular menstruation, unspecified: Secondary | ICD-10-CM | POA: Diagnosis not present

## 2017-02-22 DIAGNOSIS — A609 Anogenital herpesviral infection, unspecified: Secondary | ICD-10-CM | POA: Diagnosis not present

## 2017-02-22 MED FILL — IBUPROFEN 800 MG TABS: 800 | 15 days supply | Qty: 45 | Fill #0

## 2017-02-22 MED FILL — VALACYCLOVIR HCL 500 MG TAB: 500 | 10 days supply | Qty: 20 | Fill #0

## 2017-02-22 MED FILL — LEVONOR-ETH ESTRAD 0.1-0.02: 0.1-20 | 84 days supply | Qty: 84 | Fill #0

## 2017-02-23 MED FILL — LINZESS 290 MCG CAPSULE: 290 | 30 days supply | Qty: 30 | Fill #1

## 2017-02-24 ENCOUNTER — Other Ambulatory Visit: Payer: Self-pay | Admitting: Obstetrics and Gynecology

## 2017-02-24 DIAGNOSIS — N644 Mastodynia: Secondary | ICD-10-CM

## 2017-03-03 ENCOUNTER — Other Ambulatory Visit: Payer: Self-pay

## 2017-04-07 MED FILL — LINZESS 290 MCG CAPSULE: 290 | 30 days supply | Qty: 30 | Fill #2

## 2017-04-28 DIAGNOSIS — Z01818 Encounter for other preprocedural examination: Secondary | ICD-10-CM | POA: Diagnosis not present

## 2017-04-29 DIAGNOSIS — Z01818 Encounter for other preprocedural examination: Secondary | ICD-10-CM | POA: Diagnosis not present

## 2017-05-03 MED FILL — NITROFURANTOIN MONO-MCR 100: 100 | 7 days supply | Qty: 14 | Fill #0

## 2017-05-04 NOTE — Patient Instructions (Addendum)
Your procedure is scheduled on:  Wednesday, April 17  [facility]  [facility] am  Pick up the phone at the desk and dial [phone].  Call this number if you have problems the morning of surgery: [phone].  Remember: Do NOT eat or Do NOT drink clear liquids (including water) after midnight Tuesday  Take these medicines the morning of surgery with a SIP OF WATER:  None  Stop herbal medications, vitamin supplements and ibuprofen at this time.  Do NOT wear jewelry (body piercing), metal hair clips/bobby pins, make-up, or nail polish. Do NOT wear lotions, powders, or perfumes.  You may wear deoderant. Do NOT shave for 48 hours prior to surgery. Do NOT bring valuables to the hospital.  Leave suitcase in car.  After surgery it may be brought to your room.  For patients admitted to the hospital, checkout time is 11:00 AM the day of discharge. Home with husband Clovis RileyMitchell cell [phone].

## 2017-05-10 ENCOUNTER — Other Ambulatory Visit: Payer: Self-pay

## 2017-05-10 ENCOUNTER — Encounter (HOSPITAL_COMMUNITY)
Admission: RE | Admit: 2017-05-10 | Discharge: 2017-05-10 | Disposition: A | Discharge status: Home / Self Care | Payer: 59 | Source: Ambulatory Visit | Attending: Obstetrics and Gynecology | Admitting: Obstetrics and Gynecology

## 2017-05-10 ENCOUNTER — Encounter (HOSPITAL_COMMUNITY): Payer: Self-pay

## 2017-05-10 DIAGNOSIS — N888 Other specified noninflammatory disorders of cervix uteri: Secondary | ICD-10-CM | POA: Diagnosis not present

## 2017-05-10 DIAGNOSIS — N736 Female pelvic peritoneal adhesions (postinfective): Secondary | ICD-10-CM | POA: Diagnosis not present

## 2017-05-10 DIAGNOSIS — Z79899 Other long term (current) drug therapy: Secondary | ICD-10-CM | POA: Diagnosis not present

## 2017-05-10 DIAGNOSIS — N926 Irregular menstruation, unspecified: Secondary | ICD-10-CM | POA: Diagnosis not present

## 2017-05-10 DIAGNOSIS — Z8249 Family history of ischemic heart disease and other diseases of the circulatory system: Secondary | ICD-10-CM | POA: Diagnosis not present

## 2017-05-10 HISTORY — DX: Other seasonal allergic rhinitis: J30.2

## 2017-05-10 HISTORY — DX: Anemia, unspecified: D64.9

## 2017-05-10 HISTORY — DX: Herpesviral infection, unspecified: B00.9

## 2017-05-10 HISTORY — DX: Personal history of other medical treatment: Z92.89

## 2017-05-10 LAB — COMPREHENSIVE METABOLIC PANEL
ALT: 13 U/L — ABNORMAL LOW (ref 14–54)
AST: 19 U/L (ref 15–41)
Albumin: 3.9 g/dL (ref 3.5–5.0)
Alkaline Phosphatase: 45 U/L (ref 38–126)
Anion gap: 8 (ref 5–15)
BUN: 11 mg/dL (ref 6–20)
CO2: 24 mmol/L (ref 22–32)
Calcium: 8.9 mg/dL (ref 8.9–10.3)
Chloride: 105 mmol/L (ref 101–111)
Creatinine, Ser: 0.78 mg/dL (ref 0.44–1.00)
GFR calc Af Amer: >60 mL/min (ref >60)
GFR calc non Af Amer: >60 mL/min (ref >60)
Glucose, Bld: 88 mg/dL (ref 65–99)
Potassium: 4 mmol/L (ref 3.5–5.1)
Sodium: 137 mmol/L (ref 135–145)
Total Bilirubin: 0.5 mg/dL (ref 0.3–1.2)
Total Protein: 7.4 g/dL (ref 6.5–8.1)

## 2017-05-10 LAB — CBC
HCT: 37.6 % (ref 36.0–46.0)
Hemoglobin: 12.9 g/dL (ref 12.0–15.0)
MCH: 31.9 pg (ref 26.0–34.0)
MCHC: 34.3 g/dL (ref 30.0–36.0)
MCV: 92.8 fL (ref 78.0–100.0)
Platelets: 283 10*3/uL (ref 150–400)
RBC: 4.05 MIL/uL (ref 3.87–5.11)
RDW: 11.8 % (ref 11.5–15.5)
WBC: 7.3 10*3/uL (ref 4.0–10.5)

## 2017-05-10 LAB — SURGICAL PCR SCREEN
MRSA, PCR: NEGATIVE
Staphylococcus aureus: NEGATIVE

## 2017-05-10 LAB — TYPE AND SCREEN
ABO/RH(D): O POS
Antibody Screen: NEGATIVE

## 2017-05-10 NOTE — H&P (Signed)
[name] is an 32 y.o. female G2P2 w.  ith irregular bleeding, despite OCP use for definitive management.  Had [place] after reports of irr VB and pelvic pain.  US shows ? Hydrosalpinx on R, ? Adenomyosis.  D/w pt r/b/a of surgery.    Pertinent Gynecological History: OB History: G2, P2002 s/p LTCS x 2,  first as failed IOL, second as repeat w VB needed reexploration.    No abn pap +STD - HSV  Menstrual History: Patient's last menstrual period was 04/20/2017 (exact date).    Past Medical History:  Diagnosis Date  . Abnormal Pap smear   . Anemia   . BV (bacterial vaginosis)   . Cyst   . Headache(784.0)    otc med prn  . History of blood transfusion 05/2011   [facility]  . History of kidney stones    passed stone no surgery required  . History of renal stone   . HSV infection   . Lactating mother 07/04/2011  . Migraine   . Ovarian cyst   . Panic attack    no meds  . Seasonal allergies     Past Surgical History:  Procedure Laterality Date  . CESAREAN SECTION  03/2009  . CESAREAN SECTION  06/24/2011   Procedure: CESAREAN SECTION;  Surgeon: Esmeralda Arthur, MD;  Location: [facility];  Service: Gynecology;  Laterality: N/A;  . LAPAROTOMY  06/25/2011   Procedure: EXPLORATORY LAPAROTOMY; Repair ileus  Surgeon: Purcell Nails, MD;  Location: [facility];  Service: Gynecology;  Laterality: N/A;   . WISDOM TOOTH EXTRACTION  2008, 02/2010    Family History  Problem Relation Age of Onset  . Hypertension Mother   . Thrombophlebitis Mother   . Anemia Mother   . Heart disease Father   . Hypertension Father   . Diabetes Father   . Kidney disease Father   . Sickle cell trait Sister   . Asperger's syndrome Brother   . Lupus Maternal Aunt   . Lupus Paternal Aunt   . Heart attack Maternal Grandmother   . Thrombophlebitis Maternal Grandmother   . Transient ischemic attack Maternal Grandmother   . Heart disease Maternal Grandmother        MI  . Alzheimer's disease Maternal Grandfather   .  Bipolar disorder Maternal Grandfather   . Heart murmur Maternal Grandfather   . Mental illness Maternal Grandfather   . Hypotension Neg Hx   . Anesthesia problems Neg Hx   . Malignant hyperthermia Neg Hx   . Pseudochol deficiency Neg Hx     Social History:  reports that she has never smoked. She has never used smokeless tobacco. She reports that she does not drink alcohol or use drugs. married, MICU tech  Allergies: No Known Allergies  Meds: OCP, Linzess    Review of Systems  Constitutional: Negative.   HENT: Negative.   Eyes: Negative.   Respiratory: Negative.   Cardiovascular: Negative.   Gastrointestinal: Negative.   Genitourinary: Negative.   Musculoskeletal: Negative.   Skin: Negative.   Neurological: Negative.   Psychiatric/Behavioral: Negative.     Last menstrual period 04/20/2017. Physical Exam  Constitutional: She is oriented to person, place, and time. She appears well-developed and well-nourished.  HENT:  Head: Normocephalic and atraumatic.  Cardiovascular: Normal rate and regular rhythm.  Respiratory: Effort normal and breath sounds normal. No respiratory distress. She has no wheezes.  GI: Soft. Bowel sounds are normal. She exhibits no distension. There is tenderness.  Musculoskeletal: Normal range  of motion.  Neurological: She is alert and oriented to person, place, and time.  Skin: Skin is warm and dry.  Psychiatric: She has a normal mood and affect. Her behavior is normal.    US - ?hydrosalpinx, ? Adenomyosis. constipation  Assessment/Plan: 32yo G3P2 with irr VB for definitive therapy D/w pt r/b/a of LAVH/BS wish to proceed   [name] 05/10/2017, 10:10 PM

## 2017-05-10 NOTE — Pre-Procedure Instructions (Signed)
SDS BB history log given to lab for patient's previous history of blood transfusion in 05/2011.

## 2017-05-11 NOTE — Anesthesia Preprocedure Evaluation (Addendum)
Anesthesia Evaluation  Patient identified by MRN, date of birth, ID band Patient awake    Reviewed: Allergy & Precautions, H&P , Patient's Chart, lab work & pertinent test results, reviewed documented beta blocker date and time   Airway Mallampati: II  TM Distance: >3 FB Neck ROM: full    Dental no notable dental hx.    Pulmonary    Pulmonary exam normal breath sounds clear to auscultation       Cardiovascular  Rhythm:regular Rate:Normal     Neuro/Psych    GI/Hepatic   Endo/Other    Renal/GU      Musculoskeletal   Abdominal   Peds  Hematology   Anesthesia Other Findings   Reproductive/Obstetrics                             Anesthesia Physical Anesthesia Plan  ASA: II  Anesthesia Plan: General   Post-op Pain Management:    Induction: Intravenous  PONV Risk Score and Plan: 3 and Treatment may vary due to age or medical condition, Ondansetron, Dexamethasone and Scopolamine patch - Pre-op  Airway Management Planned: Oral ETT  Additional Equipment:   Intra-op Plan:   Post-operative Plan: Extubation in OR  Informed Consent: I have reviewed the patients History and Physical, chart, labs and discussed the procedure including the risks, benefits and alternatives for the proposed anesthesia with the patient or authorized representative who has indicated his/her understanding and acceptance.   Dental Advisory Given  Plan Discussed with: CRNA and Surgeon  Anesthesia Plan Comments: (  )        Anesthesia Quick Evaluation

## 2017-05-12 ENCOUNTER — Encounter (HOSPITAL_COMMUNITY): Payer: Self-pay

## 2017-05-12 ENCOUNTER — Other Ambulatory Visit: Payer: Self-pay

## 2017-05-12 ENCOUNTER — Observation Stay (HOSPITAL_COMMUNITY)
Admission: AD | Admit: 2017-05-12 | Discharge: 2017-05-13 | Disposition: A | Discharge status: Home / Self Care | Payer: 59 | Source: Ambulatory Visit | Attending: Obstetrics and Gynecology | Admitting: Obstetrics and Gynecology

## 2017-05-12 ENCOUNTER — Ambulatory Visit (HOSPITAL_COMMUNITY): Payer: 59 | Admitting: Anesthesiology

## 2017-05-12 ENCOUNTER — Encounter (HOSPITAL_COMMUNITY)
Admission: AD | Disposition: A | Discharge status: Home / Self Care | Payer: Self-pay | Source: Ambulatory Visit | Attending: Obstetrics and Gynecology

## 2017-05-12 DIAGNOSIS — Z79899 Other long term (current) drug therapy: Secondary | ICD-10-CM | POA: Diagnosis not present

## 2017-05-12 DIAGNOSIS — N926 Irregular menstruation, unspecified: Secondary | ICD-10-CM | POA: Diagnosis not present

## 2017-05-12 DIAGNOSIS — G43909 Migraine, unspecified, not intractable, without status migrainosus: Secondary | ICD-10-CM | POA: Diagnosis not present

## 2017-05-12 DIAGNOSIS — N736 Female pelvic peritoneal adhesions (postinfective): Secondary | ICD-10-CM | POA: Diagnosis present

## 2017-05-12 DIAGNOSIS — Z8249 Family history of ischemic heart disease and other diseases of the circulatory system: Secondary | ICD-10-CM | POA: Insufficient documentation

## 2017-05-12 DIAGNOSIS — N946 Dysmenorrhea, unspecified: Secondary | ICD-10-CM | POA: Diagnosis not present

## 2017-05-12 DIAGNOSIS — N888 Other specified noninflammatory disorders of cervix uteri: Secondary | ICD-10-CM | POA: Insufficient documentation

## 2017-05-12 DIAGNOSIS — Z90710 Acquired absence of both cervix and uterus: Secondary | ICD-10-CM

## 2017-05-12 HISTORY — PX: LAPAROSCOPIC ASSISTED VAGINAL HYSTERECTOMY: SHX5398

## 2017-05-12 HISTORY — PX: LAPAROSCOPIC LYSIS OF ADHESIONS: SHX5905

## 2017-05-12 HISTORY — DX: Acquired absence of both cervix and uterus: Z90.710

## 2017-05-12 HISTORY — DX: Female pelvic peritoneal adhesions (postinfective): N73.6

## 2017-05-12 LAB — PREGNANCY, URINE: Preg Test, Ur: NEGATIVE

## 2017-05-12 SURGERY — LYSIS, ADHESIONS, LAPAROSCOPIC
Anesthesia: General

## 2017-05-12 MED ORDER — NALOXONE HCL 0.4 MG/ML IJ SOLN: 0.4000 mg | INTRAMUSCULAR | Status: DC | PRN

## 2017-05-12 MED ORDER — SODIUM CHLORIDE 0.9 % IR SOLN
Status: DC | PRN
  Administered 2017-05-12: 3000 mL

## 2017-05-12 MED ORDER — KETOROLAC TROMETHAMINE 30 MG/ML IJ SOLN
INTRAMUSCULAR | Status: DC | PRN
  Administered 2017-05-12: 30 mg via INTRAVENOUS

## 2017-05-12 MED ORDER — FENTANYL CITRATE (PF) 250 MCG/5ML IJ SOLN
INTRAMUSCULAR | Status: AC
  Filled 2017-05-12: qty 5

## 2017-05-12 MED ORDER — MIDAZOLAM HCL 2 MG/2ML IJ SOLN
INTRAMUSCULAR | Status: AC
  Filled 2017-05-12: qty 2

## 2017-05-12 MED ORDER — MENTHOL 3 MG MT LOZG: 1.0000 | LOZENGE | OROMUCOSAL | Status: DC | PRN

## 2017-05-12 MED ORDER — HYDROMORPHONE HCL 1 MG/ML IJ SOLN
INTRAMUSCULAR | Status: AC
  Administered 2017-05-12: 1 mg
  Filled 2017-05-12: qty 0.5

## 2017-05-12 MED ORDER — FENTANYL CITRATE (PF) 100 MCG/2ML IJ SOLN
INTRAMUSCULAR | Status: DC | PRN
  Administered 2017-05-12 (×2): 50 ug via INTRAVENOUS
  Administered 2017-05-12: 100 ug via INTRAVENOUS
  Administered 2017-05-12 (×3): 50 ug via INTRAVENOUS

## 2017-05-12 MED ORDER — LACTATED RINGERS IV SOLN
INTRAVENOUS | Status: DC
  Administered 2017-05-12: 18:00:00 via INTRAVENOUS

## 2017-05-12 MED ORDER — PROMETHAZINE HCL 25 MG/ML IJ SOLN
12.5000 mg | Freq: Four times a day (QID) | INTRAMUSCULAR | Status: DC | PRN
  Administered 2017-05-12: 12.5 mg via INTRAVENOUS
  Filled 2017-05-12: qty 1

## 2017-05-12 MED ORDER — SCOPOLAMINE 1 MG/3DAYS TD PT72
MEDICATED_PATCH | TRANSDERMAL | Status: AC
  Administered 2017-05-12: 1.5 mg via TRANSDERMAL
  Filled 2017-05-12: qty 1

## 2017-05-12 MED ORDER — SUGAMMADEX SODIUM 500 MG/5ML IV SOLN
INTRAVENOUS | Status: DC | PRN
  Administered 2017-05-12: 150 mg via INTRAVENOUS

## 2017-05-12 MED ORDER — ACETAMINOPHEN 160 MG/5ML PO SOLN
ORAL | Status: AC
  Administered 2017-05-12: 975 mg via ORAL
  Filled 2017-05-12: qty 40.6

## 2017-05-12 MED ORDER — IBUPROFEN 800 MG PO TABS: 800.0000 mg | ORAL_TABLET | Freq: Three times a day (TID) | ORAL | Status: DC | PRN

## 2017-05-12 MED ORDER — ROCURONIUM BROMIDE 100 MG/10ML IV SOLN
INTRAVENOUS | Status: AC
  Filled 2017-05-12: qty 1

## 2017-05-12 MED ORDER — LIDOCAINE HCL (CARDIAC) 20 MG/ML IV SOLN
INTRAVENOUS | Status: DC | PRN
  Administered 2017-05-12: 50 mg via INTRAVENOUS

## 2017-05-12 MED ORDER — LACTATED RINGERS IV BOLUS
1000.0000 mL | Freq: Once | INTRAVENOUS | Status: AC
  Administered 2017-05-12: 1000 mL via INTRAVENOUS

## 2017-05-12 MED ORDER — HYDROMORPHONE HCL 1 MG/ML IJ SOLN
1.0000 mg | Freq: Once | INTRAMUSCULAR | Status: DC | PRN
  Filled 2017-05-12: qty 1

## 2017-05-12 MED ORDER — BUPIVACAINE HCL (PF) 0.25 % IJ SOLN
INTRAMUSCULAR | Status: AC
  Filled 2017-05-12: qty 30

## 2017-05-12 MED ORDER — LORATADINE 10 MG PO TABS
10.0000 mg | ORAL_TABLET | Freq: Every day | ORAL | Status: DC
  Filled 2017-05-12 (×2): qty 1

## 2017-05-12 MED ORDER — DEXAMETHASONE SODIUM PHOSPHATE 4 MG/ML IJ SOLN
INTRAMUSCULAR | Status: DC | PRN
  Administered 2017-05-12: 4 mg via INTRAVENOUS

## 2017-05-12 MED ORDER — HYDROMORPHONE 1 MG/ML IV SOLN
INTRAVENOUS | Status: DC
  Administered 2017-05-12: 13:00:00 via INTRAVENOUS
  Administered 2017-05-12: 1.8 mg via INTRAVENOUS
  Filled 2017-05-12: qty 25

## 2017-05-12 MED ORDER — HYDROMORPHONE HCL 1 MG/ML IJ SOLN
0.2500 mg | INTRAMUSCULAR | Status: DC | PRN
  Administered 2017-05-12: 0.5 mg via INTRAVENOUS

## 2017-05-12 MED ORDER — BUPIVACAINE HCL (PF) 0.25 % IJ SOLN
INTRAMUSCULAR | Status: DC | PRN
  Administered 2017-05-12: 21 mL

## 2017-05-12 MED ORDER — VASOPRESSIN 20 UNIT/ML IV SOLN
INTRAVENOUS | Status: AC
  Filled 2017-05-12: qty 1

## 2017-05-12 MED ORDER — PROPOFOL 10 MG/ML IV BOLUS
INTRAVENOUS | Status: DC | PRN
  Administered 2017-05-12: 200 mg via INTRAVENOUS

## 2017-05-12 MED ORDER — PROPOFOL 10 MG/ML IV BOLUS
INTRAVENOUS | Status: AC
  Filled 2017-05-12: qty 20

## 2017-05-12 MED ORDER — GUAIFENESIN 100 MG/5ML PO SOLN: 15.0000 mL | ORAL | Status: DC | PRN

## 2017-05-12 MED ORDER — HYDROMORPHONE HCL 1 MG/ML IJ SOLN
INTRAMUSCULAR | Status: AC
  Filled 2017-05-12: qty 1

## 2017-05-12 MED ORDER — ONDANSETRON HCL 4 MG/2ML IJ SOLN
INTRAMUSCULAR | Status: DC | PRN
  Administered 2017-05-12: 4 mg via INTRAVENOUS

## 2017-05-12 MED ORDER — MIDAZOLAM HCL 2 MG/2ML IJ SOLN
INTRAMUSCULAR | Status: DC | PRN
  Administered 2017-05-12: 2 mg via INTRAVENOUS

## 2017-05-12 MED ORDER — LACTATED RINGERS IV SOLN: INTRAVENOUS | Status: DC

## 2017-05-12 MED ORDER — SODIUM CHLORIDE 0.9% FLUSH: 9.0000 mL | INTRAVENOUS | Status: DC | PRN

## 2017-05-12 MED ORDER — MEPERIDINE HCL 25 MG/ML IJ SOLN
6.2500 mg | INTRAMUSCULAR | Status: DC | PRN
  Administered 2017-05-12: 6.25 mg via INTRAVENOUS

## 2017-05-12 MED ORDER — CEFAZOLIN SODIUM-DEXTROSE 2-4 GM/100ML-% IV SOLN
2.0000 g | INTRAVENOUS | Status: AC
  Administered 2017-05-12: 2 g via INTRAVENOUS

## 2017-05-12 MED ORDER — VASOPRESSIN 20 UNIT/ML IV SOLN
INTRAVENOUS | Status: DC | PRN
  Administered 2017-05-12: 20 mL via INTRAMUSCULAR

## 2017-05-12 MED ORDER — FENTANYL CITRATE (PF) 100 MCG/2ML IJ SOLN
INTRAMUSCULAR | Status: AC
  Filled 2017-05-12: qty 2

## 2017-05-12 MED ORDER — SIMETHICONE 80 MG PO CHEW
80.0000 mg | CHEWABLE_TABLET | Freq: Four times a day (QID) | ORAL | Status: DC | PRN
  Administered 2017-05-13: 80 mg via ORAL
  Filled 2017-05-12: qty 1

## 2017-05-12 MED ORDER — SODIUM CHLORIDE 0.9 % IJ SOLN
INTRAMUSCULAR | Status: AC
  Filled 2017-05-12: qty 10

## 2017-05-12 MED ORDER — OXYCODONE-ACETAMINOPHEN 5-325 MG PO TABS
1.0000 | ORAL_TABLET | ORAL | Status: DC | PRN
  Administered 2017-05-12 – 2017-05-13 (×3): 2 via ORAL
  Filled 2017-05-12 (×3): qty 2

## 2017-05-12 MED ORDER — SODIUM CHLORIDE 0.9 % IV SOLN
8.0000 mg | Freq: Once | INTRAVENOUS | Status: AC
  Administered 2017-05-12: 8 mg via INTRAVENOUS
  Filled 2017-05-12: qty 4

## 2017-05-12 MED ORDER — LIDOCAINE HCL (CARDIAC) 20 MG/ML IV SOLN
INTRAVENOUS | Status: AC
  Filled 2017-05-12: qty 5

## 2017-05-12 MED ORDER — SUGAMMADEX SODIUM 500 MG/5ML IV SOLN
INTRAVENOUS | Status: AC
  Filled 2017-05-12: qty 5

## 2017-05-12 MED ORDER — MEPERIDINE HCL 25 MG/ML IJ SOLN
INTRAMUSCULAR | Status: AC
  Administered 2017-05-12: 6.25 mg via INTRAVENOUS
  Filled 2017-05-12: qty 1

## 2017-05-12 MED ORDER — LACTATED RINGERS IV SOLN
INTRAVENOUS | Status: DC
  Administered 2017-05-12 (×4): via INTRAVENOUS

## 2017-05-12 MED ORDER — SCOPOLAMINE 1 MG/3DAYS TD PT72
1.0000 | MEDICATED_PATCH | Freq: Once | TRANSDERMAL | Status: DC
  Administered 2017-05-12: 1.5 mg via TRANSDERMAL

## 2017-05-12 MED ORDER — ONDANSETRON HCL 4 MG PO TABS: 4.0000 mg | ORAL_TABLET | Freq: Four times a day (QID) | ORAL | Status: DC | PRN

## 2017-05-12 MED ORDER — ONDANSETRON HCL 4 MG/2ML IJ SOLN: 4.0000 mg | Freq: Four times a day (QID) | INTRAMUSCULAR | Status: DC | PRN

## 2017-05-12 MED ORDER — SODIUM CHLORIDE 0.9 % IJ SOLN
INTRAMUSCULAR | Status: AC
  Filled 2017-05-12: qty 100

## 2017-05-12 MED ORDER — DIPHENHYDRAMINE HCL 12.5 MG/5ML PO ELIX: 12.5000 mg | ORAL_SOLUTION | Freq: Four times a day (QID) | ORAL | Status: DC | PRN

## 2017-05-12 MED ORDER — ALUM & MAG HYDROXIDE-SIMETH 200-200-20 MG/5ML PO SUSP: 30.0000 mL | ORAL | Status: DC | PRN

## 2017-05-12 MED ORDER — ACETAMINOPHEN 160 MG/5ML PO SOLN
975.0000 mg | Freq: Once | ORAL | Status: AC
  Administered 2017-05-12: 975 mg via ORAL

## 2017-05-12 MED ORDER — DIPHENHYDRAMINE HCL 50 MG/ML IJ SOLN: 12.5000 mg | Freq: Four times a day (QID) | INTRAMUSCULAR | Status: DC | PRN

## 2017-05-12 MED ORDER — CEFAZOLIN SODIUM-DEXTROSE 2-4 GM/100ML-% IV SOLN
INTRAVENOUS | Status: AC
  Filled 2017-05-12: qty 100

## 2017-05-12 MED ORDER — ROCURONIUM BROMIDE 100 MG/10ML IV SOLN
INTRAVENOUS | Status: DC | PRN
  Administered 2017-05-12 (×2): 10 mg via INTRAVENOUS
  Administered 2017-05-12: 40 mg via INTRAVENOUS
  Administered 2017-05-12 (×2): 10 mg via INTRAVENOUS

## 2017-05-12 MED ORDER — HYDROMORPHONE HCL 1 MG/ML IJ SOLN
INTRAMUSCULAR | Status: DC | PRN
  Administered 2017-05-12 (×2): 0.5 mg via INTRAVENOUS

## 2017-05-12 SURGICAL SUPPLY — 39 items
CABLE HIGH FREQUENCY MONO STRZ (ELECTRODE) IMPLANT
CONT PATH 16OZ SNAP LID 3702 (MISCELLANEOUS) ×3 IMPLANT
COVER BACK TABLE 60X90IN (DRAPES) ×3 IMPLANT
COVER MAYO STAND STRL (DRAPES) ×3 IMPLANT
DECANTER SPIKE VIAL GLASS SM (MISCELLANEOUS) ×2 IMPLANT
DRSG OPSITE POSTOP 3X4 (GAUZE/BANDAGES/DRESSINGS) IMPLANT
DURAPREP 26ML APPLICATOR (WOUND CARE) ×3 IMPLANT
ELECT REM PT RETURN 9FT ADLT (ELECTROSURGICAL) ×3
ELECTRODE REM PT RTRN 9FT ADLT (ELECTROSURGICAL) ×1 IMPLANT
FILTER SMOKE EVAC LAPAROSHD (FILTER) ×3 IMPLANT
GLOVE BIO SURGEON STRL SZ 6.5 (GLOVE) ×9 IMPLANT
GLOVE BIOGEL PI IND STRL 7.0 (GLOVE) ×6 IMPLANT
GLOVE BIOGEL PI INDICATOR 7.0 (GLOVE) ×3
LEGGING LITHOTOMY PAIR STRL (DRAPES) ×3 IMPLANT
NEEDLE INSUFFLATION 120MM (ENDOMECHANICALS) ×3 IMPLANT
NS IRRIG 1000ML POUR BTL (IV SOLUTION) ×3 IMPLANT
PACK LAVH (CUSTOM PROCEDURE TRAY) ×3 IMPLANT
PACK ROBOTIC GOWN (GOWN DISPOSABLE) ×3 IMPLANT
PACK TRENDGUARD 450 HYBRID PRO (MISCELLANEOUS) ×1 IMPLANT
PACK TRENDGUARD 600 HYBRD PROC (MISCELLANEOUS) IMPLANT
PROTECTOR NERVE ULNAR (MISCELLANEOUS) ×6 IMPLANT
SET CYSTO W/LG BORE CLAMP LF (SET/KITS/TRAYS/PACK) IMPLANT
SET IRRIG TUBING LAPAROSCOPIC (IRRIGATION / IRRIGATOR) ×2 IMPLANT
SHEARS HARMONIC ACE PLUS 36CM (ENDOMECHANICALS) ×3 IMPLANT
SLEEVE XCEL OPT CAN 5 100 (ENDOMECHANICALS) ×6 IMPLANT
SUT VIC AB 1 CT1 18XBRD ANBCTR (SUTURE) ×4 IMPLANT
SUT VIC AB 1 CT1 8-18 (SUTURE) ×6
SUT VIC AB 2-0 CT1 (SUTURE) ×3 IMPLANT
SUT VIC AB 4-0 PS2 27 (SUTURE) ×3 IMPLANT
SUT VICRYL 0 TIES 12 18 (SUTURE) ×3 IMPLANT
SUT VICRYL 0 UR6 27IN ABS (SUTURE) IMPLANT
TOWEL OR 17X24 6PK STRL BLUE (TOWEL DISPOSABLE) ×6 IMPLANT
TRAY FOLEY W/BAG SLVR 14FR (SET/KITS/TRAYS/PACK) ×3 IMPLANT
TRENDGUARD 450 HYBRID PRO PACK (MISCELLANEOUS) ×3
TRENDGUARD 600 HYBRID PROC PK (MISCELLANEOUS)
TROCAR XCEL NON-BLD 5MMX100MML (ENDOMECHANICALS) ×3 IMPLANT
TUBING CONNECTING 10 (TUBING) ×2 IMPLANT
WARMER LAPAROSCOPE (MISCELLANEOUS) ×3 IMPLANT
YANKAUER SUCT BULB TIP NO VENT (SUCTIONS) ×2 IMPLANT

## 2017-05-12 NOTE — Addendum Note (Signed)
Addendum  created 05/12/17 1835 by Graciela HusbandsFussell, Shanya Ferriss O, CRNA   Sign clinical note

## 2017-05-12 NOTE — Progress Notes (Addendum)
Day of Surgery Procedure(s): EXTENSIVE LAPAROSCOPIC LYSIS OF ADHESIONS LAPAROSCOPIC ASSISTED VAGINAL HYSTERECTOMY  Subjective: Patient reports nausea, incisional pain and tolerating PO.    Objective: I have reviewed patient's vital signs, intake and output and labs.  General: alert and no distress Resp: clear to auscultation bilaterally Cardio: regular rate and rhythm GI: soft, non-tender; bowel sounds normal; no masses,  no organomegaly and incision: clean, dry and intact Extremities: extremities normal, atraumatic, no cyanosis or edema  Assessment: s/p Procedure(s): EXTENSIVE LAPAROSCOPIC LYSIS OF ADHESIONS LAPAROSCOPIC ASSISTED VAGINAL HYSTERECTOMY: stable and progressing well  Plan: Advance diet Encourage ambulation   Routine PostOp care.     LOS: 0 days    Latrell Reitan Bovard-Stuckert 05/12/2017, 8:21 PM

## 2017-05-12 NOTE — Interval H&P Note (Signed)
History and Physical Interval Note:  05/12/2017 8:07 AM  [name]  has presented today for surgery, with the diagnosis of irregular periods  The various methods of treatment have been discussed with the patient and family. After consideration of risks, benefits and other options for treatment, the patient has consented to  Procedure(s): LAPAROSCOPIC ASSISTED VAGINAL HYSTERECTOMY WITH SALPINGECTOMY (Bilateral) as a surgical intervention .  The patient's history has been reviewed, patient examined, no change in status, stable for surgery.  I have reviewed the patient's chart and labs.  Questions were answered to the patient's satisfaction.     Kymani Laursen Bovard-Stuckert

## 2017-05-12 NOTE — Anesthesia Postprocedure Evaluation (Signed)
Anesthesia Post Note  Patient: [name]  Procedure(s) Performed: EXTENSIVE LAPAROSCOPIC LYSIS OF ADHESIONS LAPAROSCOPIC ASSISTED VAGINAL HYSTERECTOMY     Patient location during evaluation: PACU Anesthesia Type: General Level of consciousness: awake and alert Pain management: pain level controlled Vital Signs Assessment: post-procedure vital signs reviewed and stable Respiratory status: spontaneous breathing, nonlabored ventilation, respiratory function stable and patient connected to nasal cannula oxygen Cardiovascular status: blood pressure returned to baseline and stable Postop Assessment: no apparent nausea or vomiting Anesthetic complications: no    Last Vitals:  Vitals:   05/12/17 1249 05/12/17 1402  BP: 127/84 132/82  Pulse: 93 78  Resp: 16 16  Temp: 37.5 C 37.4 C  SpO2: 100% 100%    Last Pain:  Vitals:   05/12/17 1403  TempSrc:   PainSc: 3    Pain Goal: Patients Stated Pain Goal: 2 (05/12/17 1403)               Cristela BlueJACKSON,Minal Stuller EDWARD

## 2017-05-12 NOTE — Transfer of Care (Signed)
Immediate Anesthesia Transfer of Care Note  Patient: [name]  Procedure(s) Performed: EXTENSIVE LAPAROSCOPIC LYSIS OF ADHESIONS LAPAROSCOPIC ASSISTED VAGINAL HYSTERECTOMY  Patient Location: PACU  Anesthesia Type:General  Level of Consciousness: awake, alert  and patient cooperative  Airway & Oxygen Therapy: Patient Spontanous Breathing  Post-op Assessment: Report given to RN, Post -op Vital signs reviewed and stable and Patient moving all extremities X 4  Post vital signs: Reviewed and stable  Last Vitals:  Vitals Value Taken Time  BP 130/84 05/12/2017 11:27 AM  Temp    Pulse 95 05/12/2017 11:30 AM  Resp 17 05/12/2017 11:30 AM  SpO2 100 % 05/12/2017 11:30 AM  Vitals shown include unvalidated device data.  Last Pain:  Vitals:   05/12/17 16100712  TempSrc: Oral  PainSc: 2       Patients Stated Pain Goal: 3 (05/12/17 [id number])  Complications: No apparent anesthesia complications

## 2017-05-12 NOTE — Brief Op Note (Signed)
05/12/2017  11:26 AM  PATIENT:  [name]  32 y.o. female  PRE-OPERATIVE DIAGNOSIS:  irregular periods, ? Adenomyosis, ? hydrosalpinx  POST-OPERATIVE DIAGNOSIS:  irregular periods, PELVIC ADHESIONS  PROCEDURE:  Procedure(s): EXTENSIVE LAPAROSCOPIC LYSIS OF ADHESIONS LAPAROSCOPIC ASSISTED VAGINAL HYSTERECTOMY  SURGEON:  Surgeon(s) and Role:    * Bovard-Stuckert, Travarius Lange, MD - Primary    * Banga, Sharol Givenecilia Worema, DO - Assisting  ANESTHESIA:   local and general  EBL:  250 mL   BLOOD ADMINISTERED:none  DRAINS: Urinary Catheter (Foley)   LOCAL MEDICATIONS USED:  MARCAINE     SPECIMEN:  Source of Specimen:  uterus and cervix  DISPOSITION OF SPECIMEN:  PATHOLOGY  COUNTS:  YES  TOURNIQUET:  * No tourniquets in log *  DICTATION: .Other Dictation: Dictation Number [id number]  PLAN OF CARE: Admit for overnight observation  PATIENT DISPOSITION:  PACU - hemodynamically stable.   Delay start of Pharmacological VTE agent (>24hrs) due to surgical blood loss or risk of bleeding: yes

## 2017-05-12 NOTE — Anesthesia Procedure Notes (Signed)
Procedure Name: Intubation Date/Time: 05/12/2017 8:32 AM Performed by: Sandrea Matte, CRNA Pre-anesthesia Checklist: Patient identified, Emergency Drugs available, Suction available, Patient being monitored and Timeout performed Patient Re-evaluated:Patient Re-evaluated prior to induction Oxygen Delivery Method: Circle system utilized Preoxygenation: Pre-oxygenation with 100% oxygen Induction Type: IV induction Ventilation: Mask ventilation without difficulty Laryngoscope Size: Mac and 3 Grade View: Grade II Tube type: Oral Tube size: 7.0 mm Number of attempts: 1 Airway Equipment and Method: Stylet Placement Confirmation: ETT inserted through vocal cords under direct vision,  positive ETCO2 and breath sounds checked- equal and bilateral Secured at: 21 (@ teeth) cm Tube secured with: Tape Dental Injury: Teeth and Oropharynx as per pre-operative assessment

## 2017-05-12 NOTE — Anesthesia Postprocedure Evaluation (Signed)
Anesthesia Post Note  Patient: Lynnell GrainSierra Ledford  Procedure(s) Performed: EXTENSIVE LAPAROSCOPIC LYSIS OF ADHESIONS LAPAROSCOPIC ASSISTED VAGINAL HYSTERECTOMY     Patient location during evaluation: [facility] Anesthesia Type: General Level of consciousness: awake and alert Pain management: satisfactory to patient Vital Signs Assessment: post-procedure vital signs reviewed and stable Respiratory status: spontaneous breathing and respiratory function stable Cardiovascular status: stable Postop Assessment: adequate PO intake Anesthetic complications: no    Last Vitals:  Vitals:   05/12/17 1402 05/12/17 1829  BP: 132/82 117/78  Pulse: 78 74  Resp: 16 20  Temp: 37.4 C 37 C  SpO2: 100% 100%    Last Pain:  Vitals:   05/12/17 1829  TempSrc: Oral  PainSc:    Pain Goal: Patients Stated Pain Goal: 2 (05/12/17 1403)               Karleen DolphinFUSSELL,Pranay Hilbun

## 2017-05-12 NOTE — Progress Notes (Signed)
Day of Surgery Procedure(s): EXTENSIVE LAPAROSCOPIC LYSIS OF ADHESIONS LAPAROSCOPIC ASSISTED VAGINAL HYSTERECTOMY  Subjective: Patient reports some mild nausea, but no emesis.  Tolerating few ice chips.  Ambulated x 1.  Pain controlled with PCA.  Objective: I have reviewed patient's vital signs and intake and output.  General: alert and cooperative GI: soft NT, Incision dry  Assessment: s/p Procedure(s): EXTENSIVE LAPAROSCOPIC LYSIS OF ADHESIONS LAPAROSCOPIC ASSISTED VAGINAL HYSTERECTOMY: stable  Plan: Encouraged pt to go slow with ice chips given still some nausea and h/o ileus with prior surgery. Ambulated without problem, pain controlled.  LOS: 0 days    [name] 05/12/2017, 6:57 PM

## 2017-05-13 ENCOUNTER — Encounter (HOSPITAL_COMMUNITY): Payer: Self-pay | Admitting: Obstetrics and Gynecology

## 2017-05-13 DIAGNOSIS — Z8249 Family history of ischemic heart disease and other diseases of the circulatory system: Secondary | ICD-10-CM | POA: Diagnosis not present

## 2017-05-13 DIAGNOSIS — Z79899 Other long term (current) drug therapy: Secondary | ICD-10-CM | POA: Diagnosis not present

## 2017-05-13 DIAGNOSIS — N736 Female pelvic peritoneal adhesions (postinfective): Secondary | ICD-10-CM | POA: Diagnosis not present

## 2017-05-13 DIAGNOSIS — N888 Other specified noninflammatory disorders of cervix uteri: Secondary | ICD-10-CM | POA: Diagnosis not present

## 2017-05-13 DIAGNOSIS — N926 Irregular menstruation, unspecified: Secondary | ICD-10-CM | POA: Diagnosis not present

## 2017-05-13 LAB — CBC
HCT: 31.1 % — ABNORMAL LOW (ref 36.0–46.0)
Hemoglobin: 10.6 g/dL — ABNORMAL LOW (ref 12.0–15.0)
MCH: 31.8 pg (ref 26.0–34.0)
MCHC: 34.1 g/dL (ref 30.0–36.0)
MCV: 93.4 fL (ref 78.0–100.0)
Platelets: 252 10*3/uL (ref 150–400)
RBC: 3.33 MIL/uL — ABNORMAL LOW (ref 3.87–5.11)
RDW: 12.1 % (ref 11.5–15.5)
WBC: 10.8 10*3/uL — ABNORMAL HIGH (ref 4.0–10.5)

## 2017-05-13 LAB — BASIC METABOLIC PANEL
Anion gap: 10 (ref 5–15)
BUN: 9 mg/dL (ref 6–20)
CO2: 21 mmol/L — ABNORMAL LOW (ref 22–32)
Calcium: 8.1 mg/dL — ABNORMAL LOW (ref 8.9–10.3)
Chloride: 104 mmol/L (ref 101–111)
Creatinine, Ser: 0.8 mg/dL (ref 0.44–1.00)
GFR calc Af Amer: >60 mL/min (ref >60)
GFR calc non Af Amer: >60 mL/min (ref >60)
Glucose, Bld: 118 mg/dL — ABNORMAL HIGH (ref 65–99)
Potassium: 3.6 mmol/L (ref 3.5–5.1)
Sodium: 135 mmol/L (ref 135–145)

## 2017-05-13 MED ORDER — OXYCODONE-ACETAMINOPHEN 5-325 MG PO TABS: 1.0000 | ORAL_TABLET | Freq: Four times a day (QID) | ORAL | 0 refills | Status: DC | PRN

## 2017-05-13 MED FILL — PROMETHAZINE 12.5 MG TABLET: 12.5 | 4 days supply | Qty: 15 | Fill #0

## 2017-05-13 MED FILL — OXYCODONE-ACETAMINOPHEN 5-3: 5-325 | 4 days supply | Qty: 30 | Fill #0

## 2017-05-13 NOTE — Op Note (Signed)
[name], [name]           ACCOUNT NO.:  [id number]  MEDICAL RECORD NO.:  [id number]  LOCATION:                                 FACILITY:  PHYSICIAN:  Sherron Monday, MD             DATE OF BIRTH:  DATE OF PROCEDURE:  05/12/2017 DATE OF DISCHARGE:                              OPERATIVE REPORT   PREOPERATIVE DIAGNOSES:  Irregular periods, question adenomyosis, question hydrosalpinx.  POSTOPERATIVE DIAGNOSES:  Irregular periods, question adenomyosis, extensive pelvic adhesions.  PROCEDURE:  Extensive laparoscopic lysis of adhesions, laparoscopic- assisted vaginal hysterectomy.  SURGEON:  Sherron Monday, MD.  ASSISTANTPryor Ochoa, DO.  ANESTHESIA:  Local and general.  ESTIMATED BLOOD LOSS:  250 cc.  URINE OUTPUT:  Clear urine at the end of the procedure approximately 600 cc.  INTRAVENOUS FLUIDS:  Per the anesthesia notes.  COMPLICATIONS:  Extensive pelvic adhesions requiring lysis.  PATHOLOGY:  Uterus and cervix.  DESCRIPTION OF PROCEDURE:  After informed consent was reviewed with the patient and her partner, she was transported to the operating room, placed on the table in supine position.  She was positioned for surgery with arms tucked to make sure that she was comfortable and in the Yellofins stirrups.  She was then prepped and draped in the normal sterile fashion.  Using an open-sided speculum, her cervix was easily visualized and a Hulka manipulator was placed.  Foley catheter was also placed.  Gloves were changed and gown was changed.  Attention was turned to perform the abdominal portion of the surgery.  An approximately 1 cm infraumbilical incision was made using a Veress needle. Pneumoperitoneum was attempted; however, unable to document entry into the abdomen due to scarring.  Decision was made to proceed with a right upper quadrant entry.  The stomach was compressed and the trocar was placed under direct visualization.  The extensive pelvic adhesions  were noted midline from the umbilicus to the fundus of the uterus and along the posterior wall was noted.  Some peritoneal adhesions were also noted.  These were lysed with harmonic scalpel and the laparoscopic hysterectomy was performed in the typical way. Dr Mindi Slicker performed the right side.  Dr Ellyn Hack performed the left side and vaginal portion.   The tubes were unable to be identified due to the extensive scarring.  The round ligament was lysed as well as the mesosalpinx and the cardinal ligaments to the level of the bladder flap.  Bladder flap was created to the midline from bilateral sides.  The right side was performed as well as the left.  There was extensive irrigation and attention was turned to the vaginal portion of the case.  Her cervix was easily grasped using heavy weighted speculum and Deaver retractor.  Vasopressin was placed.  The cervix was circumscribed with Bovie cautery.  The attempt was made to enter anteriorly.  This was unsuccessful.  The posterior peritoneum was entered.  Banano speculum was placed.  In a stepwise fashion.  First, the uterosacral ligaments were ligated and held and the cardinal ligaments to the level of the cornu.  They were ligated with 0 Vicryl.  The anterior peritoneum was entered.  The uterus  was delivered.  Bleeding was controlled.  The uterosacral ligaments were plicated and tied together.  The cuff was closed with 2-0 Vicryl.  Gloves and gown were changed.  Attention was turned to the abdominal portion of the case. There was noted to be blood in the pelvis, however, no active bleeding. This was irrigated and removed.  The ports removed with direct visualization.  The peritoneal the pneumoperitoneum was evacuated from the incisions were closed with 4-0 Vicryl in a subcu fashion.  Dermabond was also applied.  The patient tolerated the procedure well.  Sponge, lap, and needle counts were correct x2 per the operating staff.     Sherron MondayJody Bovard,  MD   ______________________________ Sherron MondayJody Bovard, MD    JB/MEDQ  D:  05/12/2017  T:  05/12/2017  Job:  [id number]

## 2017-05-13 NOTE — Progress Notes (Signed)
1 Day Post-Op Procedure(s): EXTENSIVE LAPAROSCOPIC LYSIS OF ADHESIONS LAPAROSCOPIC ASSISTED VAGINAL HYSTERECTOMY  Subjective: Patient reports incisional pain and tolerating PO/  D/C foley this am.  Pain controlled.    Objective: I have reviewed patient's vital signs, intake and output and labs.  General: alert and no distress Resp: clear to auscultation bilaterally Cardio: regular rate and rhythm GI: soft, non-tender; bowel sounds normal; no masses,  no organomegaly and incision: clean, dry and intact Extremities: extremities normal, atraumatic, no cyanosis or edema  Assessment: s/p Procedure(s): EXTENSIVE LAPAROSCOPIC LYSIS OF ADHESIONS LAPAROSCOPIC ASSISTED VAGINAL HYSTERECTOMY: stable and progressing well  Plan: Encourage ambulation Advance to PO medication Discharge home when voiding, tolerating diet, ambulating and pain controlled with po pain meds F/u 2 and 6 weeks   LOS: 0 days    [name] 05/13/2017, 7:38 AM

## 2017-05-13 NOTE — Progress Notes (Signed)
Discharge instructions discussed with patient. Follow up appointment, medication changes and post operative care discussed.  Patient verbalizes understanding.  Paperwork signed at this time

## 2017-05-13 NOTE — Plan of Care (Signed)
  Problem: Education: Goal: Knowledge of the prescribed therapeutic regimen will improve Outcome: Progressing   Problem: Self-Concept: Goal: Communication of feelings regarding changes in body function or appearance will improve Outcome: Progressing   Problem: Health Behavior/Discharge Planning: Goal: Ability to manage health-related needs will improve Outcome: Progressing   Problem: Clinical Measurements: Goal: Ability to maintain clinical measurements within normal limits will improve Outcome: Progressing Goal: Will remain free from infection Outcome: Progressing Goal: Diagnostic test results will improve Outcome: Progressing Goal: Respiratory complications will improve Outcome: Progressing Goal: Cardiovascular complication will be avoided Outcome: Progressing   Problem: Nutrition: Goal: Adequate nutrition will be maintained Outcome: Progressing

## 2017-05-13 NOTE — Discharge Summary (Signed)
Physician Discharge Summary  Patient ID: [name] MRN: [id number] DOB/AGE: 1985-07-18 32 y.o.  Admit date: 05/12/2017 Discharge date: 05/13/2017  Admission Diagnoses: irregular bleeding, ? Adenomyosis, ? 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  Routin epostop course - d/w when ambulating, voiding, tolerating a diet and pain well controlled with po pain meds.    Consults: None  Significant Diagnostic Studies: labs: CBC, BMP  Treatments: IV hydration, analgesia: PCA and percocet and surgery: LAVH, LOA  Discharge Exam: Blood pressure 108/70, pulse 72, temperature 98.8 F (37.1 C), temperature source Oral, resp. rate 18, height 5\' 2"  (1.575 m), weight 74.4 kg (164 lb), last menstrual period 04/20/2017, SpO2 99 %. General appearance: alert and no distress Resp: clear to auscultation bilaterally Cardio: regular rate and rhythm GI: soft, non-tender; bowel sounds normal; no masses,  no organomegaly Incision/Wound:C/D/I  Disposition: Discharge disposition: 01-Home or Self Care       Discharge Instructions    Call MD for:  persistant nausea and vomiting   Complete by:  As directed    Call MD for:  redness, tenderness, or signs of infection (pain, swelling, redness, odor or green/yellow discharge around incision site)   Complete by:  As directed    Call MD for:  severe uncontrolled pain   Complete by:  As directed    Diet - low sodium heart healthy   Complete by:  As directed    Discharge instructions   Complete by:  As directed    Call [phone] with questions or problems   Driving Restrictions   Complete by:  As directed    While taking strong pain medicine   Increase activity slowly   Complete by:  As directed    Lifting restrictions   Complete by:  As directed    No greater than 10-15lbs for 6 weeks   May shower / Bathe   Complete by:  As directed    May walk up steps   Complete by:  As directed    Sexual Activity Restrictions   Complete by:  As directed    Pelvic rest - no douching, tampons or sex for 6 weeks     Allergies as of 05/13/2017   No Known Allergies     Medication List    STOP taking these medications   levonorgestrel-ethinyl estradiol 0.1-20 MG-MCG tablet Commonly known as:  AVIANE,ALESSE,LESSINA   nitrofurantoin (macrocrystal-monohydrate) 100 MG capsule Commonly known as:  MACROBID     TAKE these medications   cetirizine 10 MG tablet Commonly known as:  ZYRTEC Take 10 mg by mouth daily as needed for allergies.   ibuprofen 800 MG tablet Commonly known as:  ADVIL,MOTRIN Take 800 mg by mouth every 8 (eight) hours as needed for pain.   oxyCODONE-acetaminophen 5-325 MG tablet Commonly known as:  PERCOCET/ROXICET Take 1-2 tablets by mouth every 6 (six) hours as needed for severe pain (moderate to severe pain (when tolerating fluids)).   valACYclovir 500 MG tablet Commonly known as:  VALTREX Take 500 mg by mouth 2 (two) times daily as needed. For fever blister/cold sores      Follow-up Information    Bovard-Stuckert, Aasia Peavler, MD. Schedule an appointment as soon as possible for a visit in 2 week(s).   Specialty:  Obstetrics and Gynecology Why:  and  6 weeks for postoperative care.   Contact information: [address] [place] [address] [phone]           Signed: Sherian ReinJody Bovard-Stuckert 05/13/2017, 7:59 AM

## 2017-05-17 MED FILL — IBUPROFEN 800 MG TAB: 800 | 15 days supply | Qty: 45 | Fill #1

## 2017-05-19 MED FILL — oxyCODONE HCL 5 MG TABS: 5 | 3 days supply | Qty: 20 | Fill #0

## 2017-05-28 DIAGNOSIS — N3941 Urge incontinence: Secondary | ICD-10-CM | POA: Diagnosis not present

## 2017-05-28 DIAGNOSIS — G8918 Other acute postprocedural pain: Secondary | ICD-10-CM | POA: Diagnosis not present

## 2017-05-28 MED FILL — OXYBUTYNIN CL ER 5 MG TAB: 5 | 30 days supply | Qty: 30 | Fill #0

## 2017-05-28 MED FILL — NAPROXEN 500 MG TABLET: 500 | 22 days supply | Qty: 45 | Fill #0

## 2017-06-13 IMAGING — CR DG ANKLE COMPLETE 3+V*L*
3 series · 3 of 3 positions shown · non-contrast
Comparison: None.

CLINICAL DATA: Medial ankle and foot pain following twisting injury
6 days ago.

EXAM:
LEFT ANKLE COMPLETE - 3+ VIEW

[view not recorded (1 of 3)]
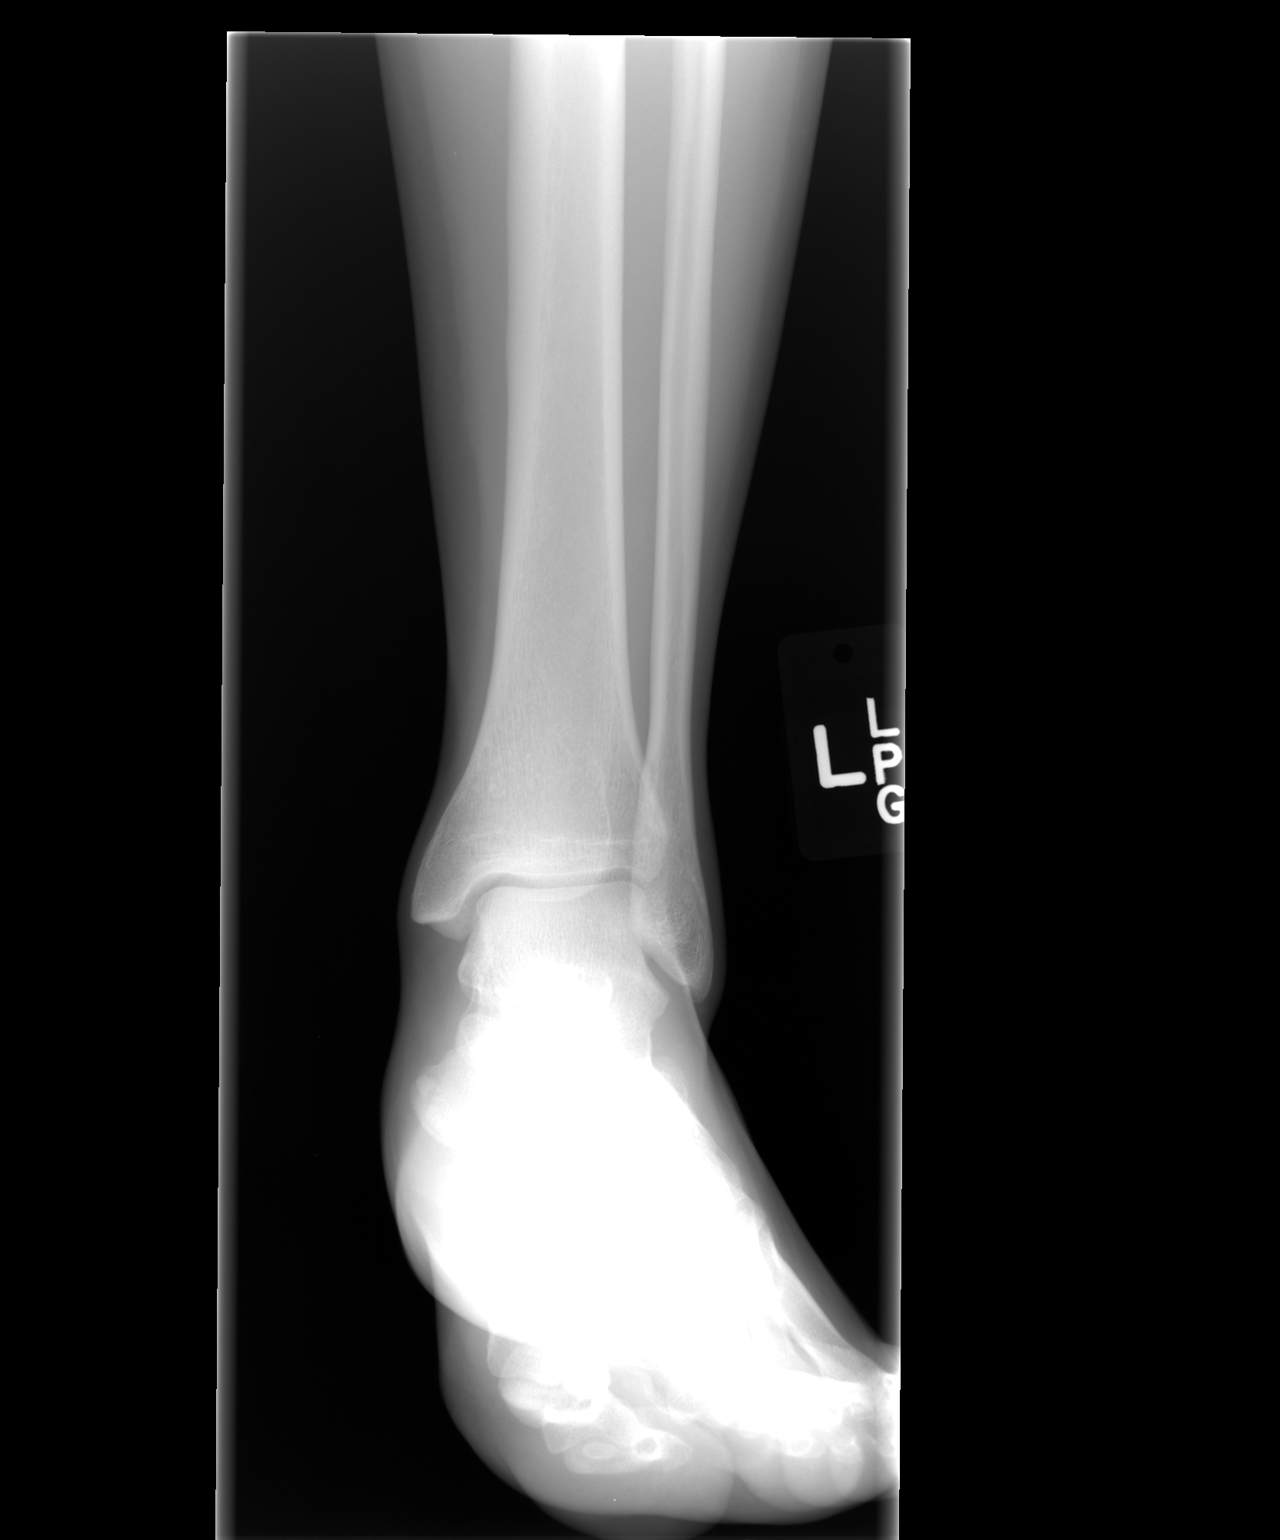

[view not recorded (2 of 3)]
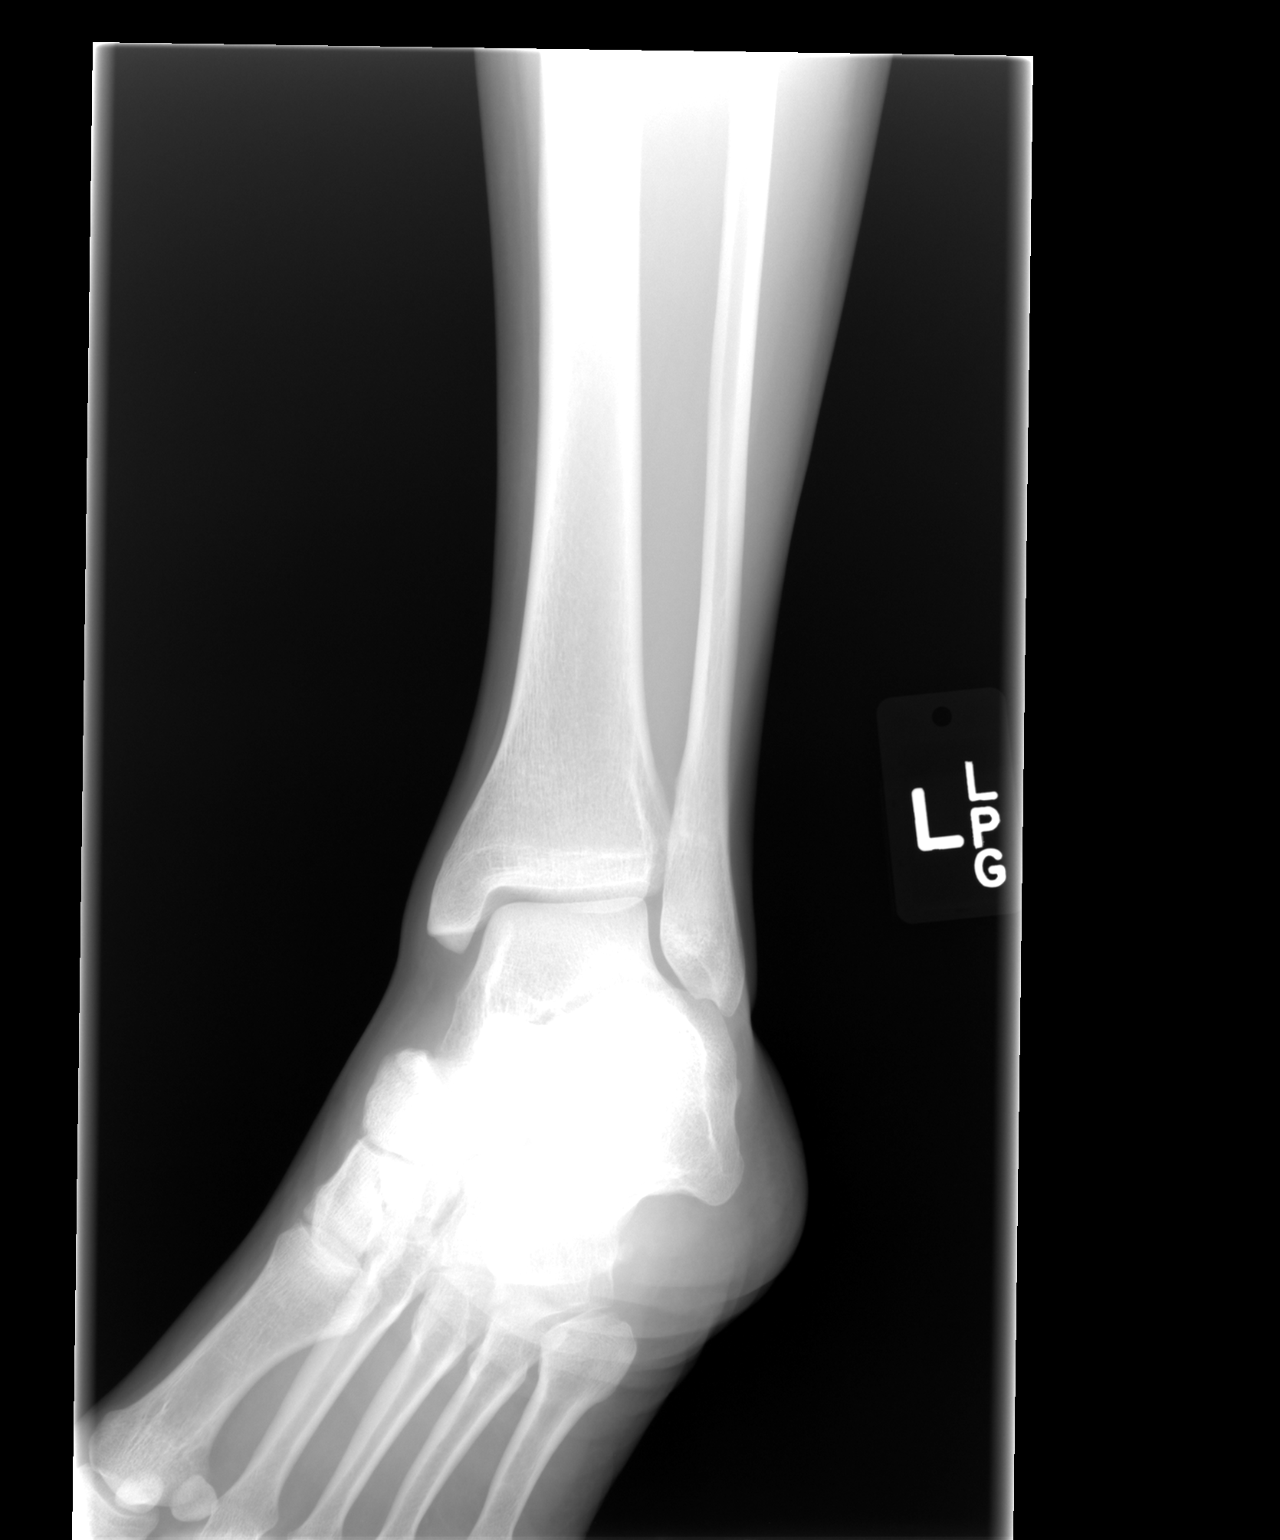

[view not recorded (3 of 3)]
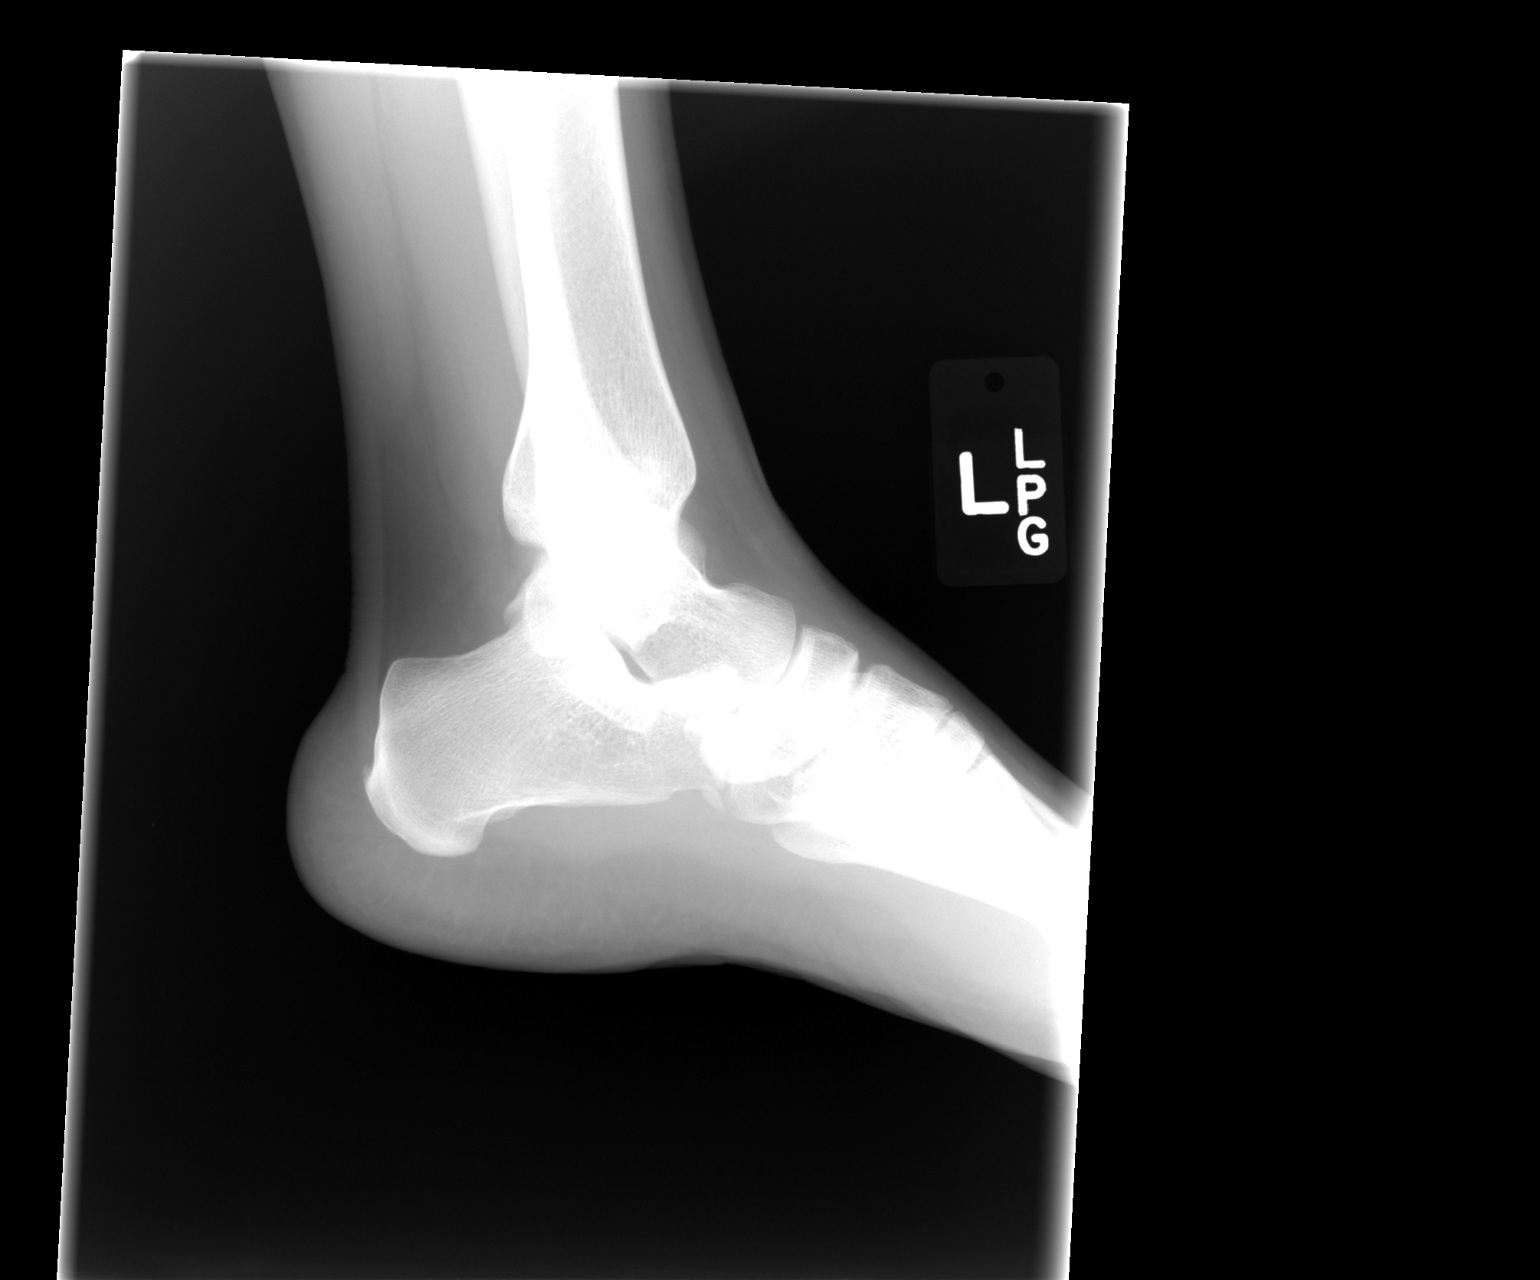

[3 of 3 positions shown; findings below may reference images not displayed]

FINDINGS: The mineralization and alignment are normal. There is no evidence of
acute fracture or dislocation. The joint spaces are maintained. No
focal soft tissue swelling demonstrated.
IMPRESSION: No acute osseous findings.

## 2017-06-22 DIAGNOSIS — N926 Irregular menstruation, unspecified: Secondary | ICD-10-CM | POA: Diagnosis not present

## 2017-06-22 DIAGNOSIS — Z1389 Encounter for screening for other disorder: Secondary | ICD-10-CM | POA: Diagnosis not present

## 2017-06-25 MED FILL — SULFAMETHOXAZOLE-TMP DS TAB: 800-160 | 7 days supply | Qty: 14 | Fill #0

## 2017-07-02 ENCOUNTER — Observation Stay (HOSPITAL_COMMUNITY)
Admission: AD | Admit: 2017-07-02 | Discharge: 2017-07-03 | Disposition: A | Discharge status: Home / Self Care | Payer: 59 | Source: Ambulatory Visit | Attending: Obstetrics and Gynecology | Admitting: Obstetrics and Gynecology

## 2017-07-02 ENCOUNTER — Inpatient Hospital Stay (HOSPITAL_COMMUNITY): Payer: 59

## 2017-07-02 ENCOUNTER — Other Ambulatory Visit: Payer: Self-pay

## 2017-07-02 ENCOUNTER — Encounter (HOSPITAL_COMMUNITY): Payer: Self-pay | Admitting: *Deleted

## 2017-07-02 DIAGNOSIS — R5082 Postprocedural fever: Secondary | ICD-10-CM | POA: Diagnosis not present

## 2017-07-02 DIAGNOSIS — R102 Pelvic and perineal pain: Secondary | ICD-10-CM | POA: Diagnosis not present

## 2017-07-02 DIAGNOSIS — N281 Cyst of kidney, acquired: Secondary | ICD-10-CM | POA: Diagnosis not present

## 2017-07-02 DIAGNOSIS — O26899 Other specified pregnancy related conditions, unspecified trimester: Principal | ICD-10-CM | POA: Insufficient documentation

## 2017-07-02 DIAGNOSIS — Z3A00 Weeks of gestation of pregnancy not specified: Secondary | ICD-10-CM | POA: Insufficient documentation

## 2017-07-02 DIAGNOSIS — G8918 Other acute postprocedural pain: Secondary | ICD-10-CM

## 2017-07-02 LAB — CBC WITH DIFFERENTIAL/PLATELET
Basophils Absolute: 0 10*3/uL (ref 0.0–0.1)
Basophils Relative: 0 %
Eosinophils Absolute: 0.4 10*3/uL (ref 0.0–0.7)
Eosinophils Relative: 5 %
HCT: 35.6 % — ABNORMAL LOW (ref 36.0–46.0)
Hemoglobin: 12.3 g/dL (ref 12.0–15.0)
Lymphocytes Relative: 10 %
Lymphs Abs: 0.8 10*3/uL (ref 0.7–4.0)
MCH: 30.5 pg (ref 26.0–34.0)
MCHC: 34.6 g/dL (ref 30.0–36.0)
MCV: 88.3 fL (ref 78.0–100.0)
Monocytes Absolute: 0.3 10*3/uL (ref 0.1–1.0)
Monocytes Relative: 4 %
Neutro Abs: 6.7 10*3/uL (ref 1.7–7.7)
Neutrophils Relative %: 81 %
Platelets: 214 10*3/uL (ref 150–400)
RBC: 4.03 MIL/uL (ref 3.87–5.11)
RDW: 12 % (ref 11.5–15.5)
WBC: 8.2 10*3/uL (ref 4.0–10.5)

## 2017-07-02 LAB — COMPREHENSIVE METABOLIC PANEL
ALT: 19 U/L (ref 14–54)
AST: 22 U/L (ref 15–41)
Albumin: 3.9 g/dL (ref 3.5–5.0)
Alkaline Phosphatase: 60 U/L (ref 38–126)
Anion gap: 11 (ref 5–15)
BUN: 10 mg/dL (ref 6–20)
CO2: 20 mmol/L — ABNORMAL LOW (ref 22–32)
Calcium: 8.7 mg/dL — ABNORMAL LOW (ref 8.9–10.3)
Chloride: 98 mmol/L — ABNORMAL LOW (ref 101–111)
Creatinine, Ser: 1.07 mg/dL — ABNORMAL HIGH (ref 0.44–1.00)
GFR calc Af Amer: >60 mL/min (ref >60)
GFR calc non Af Amer: >60 mL/min (ref >60)
Glucose, Bld: 110 mg/dL — ABNORMAL HIGH (ref 65–99)
Potassium: 3.7 mmol/L (ref 3.5–5.1)
Sodium: 129 mmol/L — ABNORMAL LOW (ref 135–145)
Total Bilirubin: 0.4 mg/dL (ref 0.3–1.2)
Total Protein: 7.7 g/dL (ref 6.5–8.1)

## 2017-07-02 LAB — URINALYSIS, ROUTINE W REFLEX MICROSCOPIC
Bilirubin Urine: NEGATIVE
Glucose, UA: NEGATIVE mg/dL
Ketones, ur: 5 mg/dL — AB
Nitrite: NEGATIVE
Protein, ur: NEGATIVE mg/dL
Specific Gravity, Urine: 1.02 (ref 1.005–1.030)
pH: 5 (ref 5.0–8.0)

## 2017-07-02 LAB — LACTIC ACID, PLASMA
Lactic Acid, Venous: 1.2 mmol/L (ref 0.5–1.9)
Lactic Acid, Venous: 0.8 mmol/L (ref 0.5–1.9)

## 2017-07-02 LAB — INFLUENZA PANEL BY PCR (TYPE A & B)
Influenza A By PCR: NEGATIVE
Influenza B By PCR: NEGATIVE

## 2017-07-02 LAB — LIPASE, BLOOD: Lipase: 23 U/L (ref 11–51)

## 2017-07-02 LAB — GROUP A STREP BY PCR: Group A Strep by PCR: NOT DETECTED

## 2017-07-02 MED ORDER — SODIUM CHLORIDE 0.9% FLUSH
3.0000 mL | Freq: Two times a day (BID) | INTRAVENOUS | Status: DC
  Administered 2017-07-03: 3 mL via INTRAVENOUS

## 2017-07-02 MED ORDER — SODIUM CHLORIDE 0.9 % IV SOLN: 250.0000 mL | INTRAVENOUS | Status: DC | PRN

## 2017-07-02 MED ORDER — SENNOSIDES-DOCUSATE SODIUM 8.6-50 MG PO TABS
1.0000 | ORAL_TABLET | Freq: Every evening | ORAL | Status: DC | PRN
  Filled 2017-07-02: qty 1

## 2017-07-02 MED ORDER — HYDROMORPHONE HCL 1 MG/ML IJ SOLN
0.5000 mg | Freq: Once | INTRAMUSCULAR | Status: AC
  Administered 2017-07-02: 0.5 mg via INTRAVENOUS
  Filled 2017-07-02: qty 1

## 2017-07-02 MED ORDER — IBUPROFEN 600 MG PO TABS
600.0000 mg | ORAL_TABLET | Freq: Four times a day (QID) | ORAL | Status: DC | PRN
  Administered 2017-07-03: 600 mg via ORAL
  Filled 2017-07-02: qty 1

## 2017-07-02 MED ORDER — SODIUM CHLORIDE 0.9 % IV SOLN
1.0000 g | INTRAVENOUS | Status: DC
  Administered 2017-07-02 – 2017-07-03 (×2): 1 g via INTRAVENOUS
  Filled 2017-07-02 (×2): qty 10

## 2017-07-02 MED ORDER — LACTATED RINGERS IV SOLN
INTRAVENOUS | Status: DC
Start: 2017-07-02 — End: 2017-07-02
  Administered 2017-07-02: 17:00:00 via INTRAVENOUS

## 2017-07-02 MED ORDER — ONDANSETRON HCL 4 MG PO TABS: 4.0000 mg | ORAL_TABLET | Freq: Four times a day (QID) | ORAL | Status: DC | PRN

## 2017-07-02 MED ORDER — ONDANSETRON HCL 4 MG/2ML IJ SOLN: 4.0000 mg | Freq: Four times a day (QID) | INTRAMUSCULAR | Status: DC | PRN

## 2017-07-02 MED ORDER — IOPAMIDOL (ISOVUE-300) INJECTION 61%
100.0000 mL | Freq: Once | INTRAVENOUS | Status: AC | PRN
  Administered 2017-07-02: 100 mL via INTRAVENOUS

## 2017-07-02 MED ORDER — ALUM & MAG HYDROXIDE-SIMETH 200-200-20 MG/5ML PO SUSP: 30.0000 mL | ORAL | Status: DC | PRN

## 2017-07-02 MED ORDER — ACETAMINOPHEN 325 MG PO TABS
650.0000 mg | ORAL_TABLET | Freq: Once | ORAL | Status: AC
  Administered 2017-07-02: 650 mg via ORAL
  Filled 2017-07-02: qty 2

## 2017-07-02 MED ORDER — PRENATAL MULTIVITAMIN CH: 1.0000 | ORAL_TABLET | Freq: Every day | ORAL | Status: DC

## 2017-07-02 MED ORDER — ACETAMINOPHEN 500 MG PO TABS
1000.0000 mg | ORAL_TABLET | Freq: Four times a day (QID) | ORAL | Status: DC | PRN
  Administered 2017-07-03 (×2): 1000 mg via ORAL
  Filled 2017-07-02 (×2): qty 2

## 2017-07-02 MED ORDER — IOPAMIDOL (ISOVUE-300) INJECTION 61%
30.0000 mL | Freq: Once | INTRAVENOUS | Status: AC | PRN
  Administered 2017-07-02: 30 mL via ORAL

## 2017-07-02 MED ORDER — ZOLPIDEM TARTRATE 5 MG PO TABS: 5.0000 mg | ORAL_TABLET | Freq: Every evening | ORAL | Status: DC | PRN

## 2017-07-02 MED ORDER — SODIUM CHLORIDE 0.9% FLUSH
3.0000 mL | INTRAVENOUS | Status: DC | PRN
  Filled 2017-07-02: qty 3

## 2017-07-02 MED ORDER — SODIUM CHLORIDE 0.9 % IV SOLN
INTRAVENOUS | Status: DC
  Administered 2017-07-02: 18:00:00 via INTRAVENOUS

## 2017-07-02 NOTE — MAU Note (Signed)
Pt reports temp at home 103. Pt reports she had a hyst on 05/12/2017 and has had pain since that time. States she is now having pelvic pain, nausea, chills, and just doesn't feel well.

## 2017-07-02 NOTE — MAU Provider Note (Addendum)
Chief Complaint:  Fever; Nausea; and Pelvic Pain   First Provider Initiated Contact with Patient 07/02/17 1622      HPI: [name] is a 32 y.o. G2P2002 who presents to maternity admissions reporting fever, pelvic pain, nausea, and sore throat.  Has had pelvic pain since her hysterectomy on 05/12/17   Fever and other symptoms developed mostly yesterday.  Denies dysuria.  She reports no vaginal bleeding, vaginal itching/burning, urinary symptoms, h/a, dizziness.    Fever   This is a new problem. The current episode started yesterday. The problem occurs constantly. The problem has been unchanged. The maximum temperature noted was 103 to 103.9 F. The temperature was taken using a tympanic thermometer. Associated symptoms include abdominal pain, ear pain, nausea and a sore throat. Pertinent negatives include no congestion, coughing, diarrhea, headaches, urinary pain or vomiting. She has tried nothing for the symptoms.  Risk factors: recent sickness (UTI)   Pelvic Pain  The patient's primary symptoms include pelvic pain. The patient's pertinent negatives include no genital itching, genital lesions, genital odor or vaginal bleeding. This is a recurrent problem. The current episode started 1 to 4 weeks ago. The problem occurs constantly. The problem has been unchanged. The problem affects the right side. She is not pregnant. Associated symptoms include abdominal pain, back pain, chills, a fever, flank pain, nausea and a sore throat. Pertinent negatives include no constipation, diarrhea, dysuria, headaches or vomiting. She has tried nothing for the symptoms. Her past medical history is significant for an abdominal surgery and a gynecological surgery.   RN Note: Pt reports temp at home 103. Pt reports she had a hyst on 05/12/2017 and has had pain since that time. States she is now having pelvic pain, nausea, chills, and just doesn't feel well.     Past Medical History: Past Medical History:   Diagnosis Date  . Abnormal Pap smear   . Anemia   . BV (bacterial vaginosis)   . Cyst   . 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 Headache(784.0)    otc med prn  . History of blood transfusion 05/2011   at [facility]  . History of kidney stones    passed stone no surgery required  . History of renal stone   . HSV infection   . Lactating mother 07/04/2011  . Migraine   . Ovarian cyst   . Panic attack    no meds  . S/P laparoscopic assisted vaginal hysterectomy (LAVH) 05/12/2017  . Seasonal allergies     Past obstetric history: OB History  Gravida Para Term Preterm AB Living  2 2 2     2   SAB TAB Ectopic Multiple Live Births          2    # Outcome Date GA Lbr Len/2nd Weight Sex Delivery Anes PTL Lv  2 Term 06/24/11 [redacted]w[redacted]d   F CS-LTranv Spinal  LIV  1 Term 03/2009 [redacted]w[redacted]d 52:00 8 lb 6 oz (3.799 kg) F CS-Unspec   LIV    Past Surgical History: Past Surgical History:  Procedure Laterality Date  . CESAREAN SECTION  03/2009  . CESAREAN SECTION  06/24/2011   Procedure: CESAREAN SECTION;  Surgeon: Esmeralda Arthur, MD;  Location: [facility];  Service: Gynecology;  Laterality: N/A;  . LAPAROSCOPIC ASSISTED VAGINAL HYSTERECTOMY  05/12/2017   Procedure: LAPAROSCOPIC ASSISTED VAGINAL HYSTERECTOMY;  Surgeon: Sherian Rein, MD;  Location: [facility];  Service: Gynecology;;  . LAPAROSCOPIC LYSIS OF ADHESIONS  05/12/2017   Procedure: EXTENSIVE LAPAROSCOPIC LYSIS  OF ADHESIONS;  Surgeon: Sherian ReinBovard-Stuckert, Jody, MD;  Location: [facility];  Service: Gynecology;;  . LAPAROTOMY  06/25/2011   Procedure: EXPLORATORY LAPAROTOMY; Repair ileus  Surgeon: Purcell NailsAngela Y Roberts, MD;  Location: [facility];  Service: Gynecology;  Laterality: N/A;   . WISDOM TOOTH EXTRACTION  2008, 02/2010    Family History: Family History  Problem Relation Age of Onset  . Hypertension Mother   . Thrombophlebitis Mother   . Anemia Mother   . Heart disease Father   . Hypertension Father   . Diabetes Father   . Kidney disease Father    . Sickle cell trait Sister   . Asperger's syndrome Brother   . Lupus Maternal Aunt   . Lupus Paternal Aunt   . Heart attack Maternal Grandmother   . Thrombophlebitis Maternal Grandmother   . Transient ischemic attack Maternal Grandmother   . Heart disease Maternal Grandmother        MI  . Alzheimer's disease Maternal Grandfather   . Bipolar disorder Maternal Grandfather   . Heart murmur Maternal Grandfather   . Mental illness Maternal Grandfather   . Hypotension Neg Hx   . Anesthesia problems Neg Hx   . Malignant hyperthermia Neg Hx   . Pseudochol deficiency Neg Hx     Social History: Social History   Tobacco Use  . Smoking status: Never Smoker  . Smokeless tobacco: Never Used  Substance Use Topics  . Alcohol use: No  . Drug use: No    Allergies: No Known Allergies  Meds:  Medications Prior to Admission  Medication Sig Dispense Refill Last Dose  . cetirizine (ZYRTEC) 10 MG tablet Take 10 mg by mouth daily as needed for allergies.   Past Week at Unknown time  . ibuprofen (ADVIL,MOTRIN) 800 MG tablet Take 800 mg by mouth every 8 (eight) hours as needed for pain.   Past Month at Unknown time  . oxyCODONE-acetaminophen (PERCOCET/ROXICET) 5-325 MG tablet Take 1-2 tablets by mouth every 6 (six) hours as needed for severe pain (moderate to severe pain (when tolerating fluids)). 30 tablet 0   . valACYclovir (VALTREX) 500 MG tablet Take 500 mg by mouth 2 (two) times daily as needed. For fever blister/cold sores  2 More than a month at Unknown time    I have reviewed patient's Past Medical Hx, Surgical Hx, Family Hx, Social Hx, medications and allergies.  ROS:  Review of Systems  Constitutional: Positive for chills and fever.  HENT: Positive for ear pain and sore throat. Negative for congestion.   Respiratory: Negative for cough.   Gastrointestinal: Positive for abdominal pain and nausea. Negative for constipation, diarrhea and vomiting.  Genitourinary: Positive for flank  pain and pelvic pain. Negative for dysuria.  Musculoskeletal: Positive for back pain.  Neurological: Negative for headaches.   Other systems negative     Physical Exam   Patient Vitals for the past 24 hrs:  BP Temp Temp src Pulse Resp SpO2 Height Weight  07/02/17 1612 120/66 (!) 103.1 F (39.5 C) Oral (!) 115 19 96 % 5\' 2"  (1.575 m) 164 lb (74.4 kg)   Constitutional: Well-developed, well-nourished female in no acute distress, but flushed and ill -appearing.  HEENT:  Right ear canal erethematous, TM cloudy,  Left ear normal   Throat injected, no pus Cardiovascular: normal rate and rhythm, no ectopy audible, S1 & S2 heard, no murmur Respiratory: normal effort, no distress. Lungs CTAB with no wheezes or crackles GI: Abd soft, tender over right side, upper and  lower.  Nondistended.  No rebound, Mild guarding.  Bowel Sounds audible  MS: Extremities nontender, no edema, normal ROM Neurologic: Alert and oriented x 4.   Grossly nonfocal. GU: Neg CVAT. Skin:  Warm and Dry Psych:  Affect appropriate.  PELVIC EXAM: Deferred due to recent surgery   Labs: Results for orders placed or performed during the hospital encounter of 07/02/17 (from the past 24 hour(s))  Urinalysis, Routine w reflex microscopic     Status: Abnormal   Collection Time: 07/02/17  4:33 PM  Result Value Ref Range   Color, Urine YELLOW YELLOW   APPearance HAZY (A) CLEAR   Specific Gravity, Urine 1.020 1.005 - 1.030   pH 5.0 5.0 - 8.0   Glucose, UA NEGATIVE NEGATIVE mg/dL   Hgb urine dipstick LARGE (A) NEGATIVE   Bilirubin Urine NEGATIVE NEGATIVE   Ketones, ur 5 (A) NEGATIVE mg/dL   Protein, ur NEGATIVE NEGATIVE mg/dL   Nitrite NEGATIVE NEGATIVE   Leukocytes, UA SMALL (A) NEGATIVE   RBC / HPF 11-20 0 - 5 RBC/hpf   WBC, UA 0-5 0 - 5 WBC/hpf   Bacteria, UA RARE (A) NONE SEEN   Squamous Epithelial / LPF 11-20 0 - 5   Mucus PRESENT   CBC with Differential/Platelet     Status: Abnormal   Collection Time: 07/02/17   4:36 PM  Result Value Ref Range   WBC 8.2 4.0 - 10.5 K/uL   RBC 4.03 3.87 - 5.11 MIL/uL   Hemoglobin 12.3 12.0 - 15.0 g/dL   HCT 69.6 (L) 29.5 - 28.4 %   MCV 88.3 78.0 - 100.0 fL   MCH 30.5 26.0 - 34.0 pg   MCHC 34.6 30.0 - 36.0 g/dL   RDW 13.2 44.0 - 10.2 %   Platelets 214 150 - 400 K/uL   Neutrophils Relative % 81 %   Neutro Abs 6.7 1.7 - 7.7 K/uL   Lymphocytes Relative 10 %   Lymphs Abs 0.8 0.7 - 4.0 K/uL   Monocytes Relative 4 %   Monocytes Absolute 0.3 0.1 - 1.0 K/uL   Eosinophils Relative 5 %   Eosinophils Absolute 0.4 0.0 - 0.7 K/uL   Basophils Relative 0 %   Basophils Absolute 0.0 0.0 - 0.1 K/uL  Comprehensive metabolic panel     Status: Abnormal   Collection Time: 07/02/17  4:36 PM  Result Value Ref Range   Sodium 129 (L) 135 - 145 mmol/L   Potassium 3.7 3.5 - 5.1 mmol/L   Chloride 98 (L) 101 - 111 mmol/L   CO2 20 (L) 22 - 32 mmol/L   Glucose, Bld 110 (H) 65 - 99 mg/dL   BUN 10 6 - 20 mg/dL   Creatinine, Ser 7.25 (H) 0.44 - 1.00 mg/dL   Calcium 8.7 (L) 8.9 - 10.3 mg/dL   Total Protein 7.7 6.5 - 8.1 g/dL   Albumin 3.9 3.5 - 5.0 g/dL   AST 22 15 - 41 U/L   ALT 19 14 - 54 U/L   Alkaline Phosphatase 60 38 - 126 U/L   Total Bilirubin 0.4 0.3 - 1.2 mg/dL   GFR calc non Af Amer >60 >60 mL/min   GFR calc Af Amer >60 >60 mL/min   Anion gap 11 5 - 15  Lipase, blood     Status: None   Collection Time: 07/02/17  4:36 PM  Result Value Ref Range   Lipase 23 11 - 51 U/L  Lactic acid, plasma     Status: None  Collection Time: 07/02/17  4:36 PM  Result Value Ref Range   Lactic Acid, Venous 1.2 0.5 - 1.9 mmol/L  Influenza panel by PCR (type A & B)     Status: None   Collection Time: 07/02/17  5:02 PM  Result Value Ref Range   Influenza A By PCR NEGATIVE NEGATIVE   Influenza B By PCR NEGATIVE NEGATIVE  Group A Strep by PCR     Status: None   Collection Time: 07/02/17  5:02 PM  Result Value Ref Range   Group A Strep by PCR NOT DETECTED NOT DETECTED  Lactic acid, plasma      Status: None   Collection Time: 07/02/17  7:34 PM  Result Value Ref Range   Lactic Acid, Venous 0.8 0.5 - 1.9 mmol/L    --/--/O POS (04/15 0840)  Imaging:  Ct Abdomen Pelvis W Contrast  Result Date: 07/02/2017 CLINICAL DATA:  Acute onset of fever, pelvic pain, nausea and sore throat. Status post hysterectomy in April. EXAM: CT ABDOMEN AND PELVIS WITH CONTRAST TECHNIQUE: Multidetector CT imaging of the abdomen and pelvis was performed using the standard protocol following bolus administration of intravenous contrast. CONTRAST:  ISOVUE-300 IOPAMIDOL (ISOVUE-300) INJECTION 61% COMPARISON:  CT of the abdomen and pelvis from 03/20/2013 FINDINGS: Lower chest: The visualized lung bases are grossly clear. The visualized portions of the mediastinum are unremarkable. Hepatobiliary: The liver is unremarkable in appearance. The gallbladder is unremarkable in appearance. The common bile duct remains normal in caliber. Pancreas: The pancreas is within normal limits. Spleen: The spleen is unremarkable in appearance. Adrenals/Urinary Tract: The adrenal glands are unremarkable in appearance. A small right renal cyst is noted. There is no evidence of hydronephrosis. No renal or ureteral stones are identified. No perinephric stranding is seen. Stomach/Bowel: The stomach is unremarkable in appearance. The small bowel is within normal limits. The appendix is normal in caliber, without evidence of appendicitis. The colon is unremarkable in appearance. Mildly prominent vasculature about the rectum is grossly stable from 2015 and may reflect the patient's baseline. Vascular/Lymphatic: The abdominal aorta is unremarkable in appearance. The inferior vena cava is grossly unremarkable. No retroperitoneal lymphadenopathy is seen. No pelvic sidewall lymphadenopathy is identified. Reproductive: The bladder is moderately distended and grossly unremarkable. The patient is status post hysterectomy. There is mild prominence of the  periuterine vasculature. The ovaries are noted at the lower quadrants bilaterally, with a 3.3 cm cystic lesion at the right adnexa. Other: No additional soft tissue abnormalities are seen. Musculoskeletal: No acute osseous abnormalities are identified. The visualized musculature is unremarkable in appearance. IMPRESSION: 1. No definite acute abnormality seen to explain the patient's symptoms. 2. 3.3 cm cystic lesion at the right adnexa. This is likely benign, though pelvic ultrasound could be considered for further evaluation, when and as deemed appropriate. 3. Small right renal cyst noted. Electronically Signed   By: Roanna Raider M.D.   On: 07/02/2017 22:27    MAU Course/MDM: I have ordered labs as follows: See above. No leukocytosis. Creatinine is slightly elevated.  Hyponatremia.  Urine with some signs of infection.  Flu test done, negative.  Strep pending. Imaging ordered: Abdominal Pelvic CT Results reviewed.   Consult Dr Jackelyn Knife who recommends CT.  .   Treatments in MAU included Tylenol which lowered fever.  Dilaudid 0.5mg  for pain.  IV hydration with NS. Marland Kitchen     Assessment: Fever Abdominal and flank pain, right Sore throat Right ear pain/inflammation Negative flu test Recent UTI Slight elevation in  creatinine Hyponatremia  Plan: CT scan per Dr Jackelyn Knife.  Wynelle Bourgeois CNM, MSN Certified Nurse-Midwife 07/02/2017 4:22 PM   Care assumed from [facility]. Williams CNM at 2000.  CT results reviewed with Dr. Jackelyn Knife- MD to put in admission orders for [facility] for repeat dose of Rocephin tomorrow and observation overnight. Plan of care discussed with patient and patient verbalizes understanding.   1. Postoperative pain    -Admit to [facility] Unit -MD to put in admission orders.  Rolm Bookbinder, CNM 07/02/17 10:58 PM

## 2017-07-03 ENCOUNTER — Encounter (HOSPITAL_COMMUNITY): Payer: Self-pay | Admitting: *Deleted

## 2017-07-03 DIAGNOSIS — R102 Pelvic and perineal pain: Secondary | ICD-10-CM | POA: Diagnosis not present

## 2017-07-03 DIAGNOSIS — R5082 Postprocedural fever: Secondary | ICD-10-CM | POA: Diagnosis not present

## 2017-07-03 DIAGNOSIS — Z3A00 Weeks of gestation of pregnancy not specified: Secondary | ICD-10-CM | POA: Diagnosis not present

## 2017-07-03 DIAGNOSIS — O26899 Other specified pregnancy related conditions, unspecified trimester: Secondary | ICD-10-CM | POA: Diagnosis not present

## 2017-07-03 LAB — CBC WITH DIFFERENTIAL/PLATELET
Basophils Absolute: 0 10*3/uL (ref 0.0–0.1)
Basophils Relative: 0 %
Eosinophils Absolute: 0.2 10*3/uL (ref 0.0–0.7)
Eosinophils Relative: 3 %
HCT: 33.7 % — ABNORMAL LOW (ref 36.0–46.0)
Hemoglobin: 11.6 g/dL — ABNORMAL LOW (ref 12.0–15.0)
Lymphocytes Relative: 25 %
Lymphs Abs: 1.7 10*3/uL (ref 0.7–4.0)
MCH: 30.5 pg (ref 26.0–34.0)
MCHC: 34.4 g/dL (ref 30.0–36.0)
MCV: 88.7 fL (ref 78.0–100.0)
Monocytes Absolute: 0.2 10*3/uL (ref 0.1–1.0)
Monocytes Relative: 4 %
Neutro Abs: 4.8 10*3/uL (ref 1.7–7.7)
Neutrophils Relative %: 68 %
Platelets: 195 10*3/uL (ref 150–400)
RBC: 3.8 MIL/uL — ABNORMAL LOW (ref 3.87–5.11)
RDW: 12 % (ref 11.5–15.5)
WBC: 6.9 10*3/uL (ref 4.0–10.5)

## 2017-07-03 LAB — URINE CULTURE: Culture: <10000 — AB

## 2017-07-03 LAB — BASIC METABOLIC PANEL
Anion gap: 8 (ref 5–15)
BUN: 10 mg/dL (ref 6–20)
CO2: 23 mmol/L (ref 22–32)
Calcium: 8.7 mg/dL — ABNORMAL LOW (ref 8.9–10.3)
Chloride: 105 mmol/L (ref 101–111)
Creatinine, Ser: 0.8 mg/dL (ref 0.44–1.00)
GFR calc Af Amer: >60 mL/min (ref >60)
GFR calc non Af Amer: >60 mL/min (ref >60)
Glucose, Bld: 111 mg/dL — ABNORMAL HIGH (ref 65–99)
Potassium: 3.4 mmol/L — ABNORMAL LOW (ref 3.5–5.1)
Sodium: 136 mmol/L (ref 135–145)

## 2017-07-03 MED ORDER — DIPHENHYDRAMINE HCL 25 MG PO CAPS
25.0000 mg | ORAL_CAPSULE | Freq: Four times a day (QID) | ORAL | Status: DC | PRN
  Administered 2017-07-03: 25 mg via ORAL
  Filled 2017-07-03: qty 1

## 2017-07-03 MED ORDER — CEPHALEXIN 500 MG PO CAPS: 500.0000 mg | ORAL_CAPSULE | Freq: Three times a day (TID) | ORAL | 0 refills | Status: AC

## 2017-07-03 MED ORDER — HYDROCORTISONE 1 % EX CREA
TOPICAL_CREAM | Freq: Three times a day (TID) | CUTANEOUS | Status: DC
  Administered 2017-07-03: 11:00:00 via TOPICAL
  Filled 2017-07-03: qty 28

## 2017-07-03 NOTE — Discharge Instructions (Signed)
Pyelonephritis, Adult Pyelonephritis is a kidney infection. The kidneys are organs that help clean your blood by moving waste out of your blood and into your pee (urine). This infection can happen quickly, or it can last for a long time. In most cases, it clears up with treatment and does not cause other problems. Follow these instructions at home: Medicines  Take over-the-counter and prescription medicines only as told by your doctor.  Take your antibiotic medicine as told by your doctor. Do not stop taking the medicine even if you start to feel better. General instructions  Drink enough fluid to keep your pee clear or pale yellow.  Avoid caffeine, tea, and carbonated drinks.  Pee (urinate) often. Avoid holding in pee for long periods of time.  Pee before and after sex.  After pooping (having a bowel movement), women should wipe from front to back. Use each tissue only once.  Keep all follow-up visits as told by your doctor. This is important. Contact a doctor if:  You do not feel better after 2 days.  Your symptoms get worse.  You have a fever. Get help right away if:  You cannot take your medicine or drink fluids as told.  You have chills and shaking.  You throw up (vomit).  You have very bad pain in your side (flank) or back.  You feel very weak or you pass out (faint). This information is not intended to replace advice given to you by your health care provider. Make sure you discuss any questions you have with your health care provider. Document Released: 02/20/2004 Document Revised: 06/20/2015 Document Reviewed: 05/07/2014 Elsevier Interactive Patient Education  2018 [employer]    Call for fever, increasing pain, nausea or vomiting

## 2017-07-03 NOTE — Plan of Care (Signed)
  Problem: Education: Goal: Knowledge of General Education information will improve Outcome: Progressing   Problem: Pain Managment: Goal: General experience of comfort will improve Outcome: Progressing   Problem: Safety: Goal: Ability to remain free from injury will improve Outcome: Progressing   

## 2017-07-03 NOTE — H&P (Signed)
HPI: [name] is a 32 y.o. G2P2002 who presents to maternity admissions reporting fever, pelvic pain, nausea, and sore throat.  Has had pelvic pain since her hysterectomy on 05/12/17   Fever and other symptoms developed mostly yesterday.  Denies dysuria.  She reports no vaginal bleeding, vaginal itching/burning, urinary symptoms, h/a, dizziness.    Fever   This is a new problem. The current episode started yesterday. The problem occurs constantly. The problem has been unchanged. The maximum temperature noted was 103 to 103.9 F. The temperature was taken using a tympanic thermometer. Associated symptoms include abdominal pain, ear pain, nausea and a sore throat. Pertinent negatives include no congestion, coughing, diarrhea, headaches, urinary pain or vomiting. She has tried nothing for the symptoms.  Risk factors: recent sickness (UTI)   Pelvic Pain  The patient's primary symptoms include pelvic pain. The patient's pertinent negatives include no genital itching, genital lesions, genital odor or vaginal bleeding. This is a recurrent problem. The current episode started 1 to 4 weeks ago. The problem occurs constantly. The problem has been unchanged. The problem affects the right side. She is not pregnant. Associated symptoms include abdominal pain, back pain, chills, a fever, flank pain, nausea and a sore throat. Pertinent negatives include no constipation, diarrhea, dysuria, headaches or vomiting. She has tried nothing for the symptoms. Her past medical history is significant for an abdominal surgery and a gynecological surgery.   RN Note: Pt reports temp at home 103. Pt reports she had a hyst on 05/12/2017 and has had pain since that time. States she is now having pelvic pain, nausea, chills, and just doesn't feel well.    Past Medical History:     Past Medical History:  Diagnosis Date  . Abnormal Pap smear   . Anemia   . BV (bacterial vaginosis)   . Cyst   . Female pelvic  peritoneal adhesions 05/12/2017  . Headache(784.0)    otc med prn  . History of blood transfusion 05/2011   at [facility]  . History of kidney stones    passed stone no surgery required  . History of renal stone   . HSV infection   . Lactating mother 07/04/2011  . Migraine   . Ovarian cyst   . Panic attack    no meds  . S/P laparoscopic assisted vaginal hysterectomy (LAVH) 05/12/2017  . Seasonal allergies     Past obstetric history:                 OB History  Gravida Para Term Preterm AB Living  2 2 2     2   SAB TAB Ectopic Multiple Live Births             2       # Outcome Date GA Lbr Len/2nd Weight Sex Delivery Anes PTL Lv  2 Term 06/24/11 [redacted]w[redacted]d   F CS-LTranv Spinal  LIV  1 Term 03/2009 [redacted]w[redacted]d 52:00 8 lb 6 oz (3.799 kg) F CS-Unspec   LIV    Past Surgical History:      Past Surgical History:  Procedure Laterality Date  . CESAREAN SECTION  03/2009  . CESAREAN SECTION  06/24/2011   Procedure: CESAREAN SECTION;  Surgeon: Esmeralda Arthur, MD;  Location: [facility];  Service: Gynecology;  Laterality: N/A;  . LAPAROSCOPIC ASSISTED VAGINAL HYSTERECTOMY  05/12/2017   Procedure: LAPAROSCOPIC ASSISTED VAGINAL HYSTERECTOMY;  Surgeon: Sherian Rein, MD;  Location: [facility];  Service: Gynecology;;  . LAPAROSCOPIC LYSIS OF ADHESIONS  05/12/2017   Procedure: EXTENSIVE LAPAROSCOPIC LYSIS OF ADHESIONS;  Surgeon: Sherian ReinBovard-Stuckert, Jody, MD;  Location: [facility];  Service: Gynecology;;  . LAPAROTOMY  06/25/2011   Procedure: EXPLORATORY LAPAROTOMY; Repair ileus  Surgeon: Purcell NailsAngela Y Roberts, MD;  Location: [facility];  Service: Gynecology;  Laterality: N/A;   . WISDOM TOOTH EXTRACTION  2008, 02/2010    Family History:      Family History  Problem Relation Age of Onset  . Hypertension Mother   . Thrombophlebitis Mother   . Anemia Mother   . Heart disease Father   . Hypertension Father   . Diabetes Father   . Kidney disease Father   . Sickle cell trait Sister   .  Asperger's syndrome Brother   . Lupus Maternal Aunt   . Lupus Paternal Aunt   . Heart attack Maternal Grandmother   . Thrombophlebitis Maternal Grandmother   . Transient ischemic attack Maternal Grandmother   . Heart disease Maternal Grandmother        MI  . Alzheimer's disease Maternal Grandfather   . Bipolar disorder Maternal Grandfather   . Heart murmur Maternal Grandfather   . Mental illness Maternal Grandfather   . Hypotension Neg Hx   . Anesthesia problems Neg Hx   . Malignant hyperthermia Neg Hx   . Pseudochol deficiency Neg Hx     Social History: Social History       Tobacco Use  . Smoking status: Never Smoker  . Smokeless tobacco: Never Used  Substance Use Topics  . Alcohol use: No  . Drug use: No    Allergies: No Known Allergies  Meds:         Medications Prior to Admission  Medication Sig Dispense Refill Last Dose  . cetirizine (ZYRTEC) 10 MG tablet Take 10 mg by mouth daily as needed for allergies.   Past Week at Unknown time  . ibuprofen (ADVIL,MOTRIN) 800 MG tablet Take 800 mg by mouth every 8 (eight) hours as needed for pain.   Past Month at Unknown time  . oxyCODONE-acetaminophen (PERCOCET/ROXICET) 5-325 MG tablet Take 1-2 tablets by mouth every 6 (six) hours as needed for severe pain (moderate to severe pain (when tolerating fluids)). 30 tablet 0   . valACYclovir (VALTREX) 500 MG tablet Take 500 mg by mouth 2 (two) times daily as needed. For fever blister/cold sores  2 More than a month at Unknown time    I have reviewed patient's Past Medical Hx, Surgical Hx, Family Hx, Social Hx, medications and allergies.  ROS:  Review of Systems  Constitutional: Positive for chills and fever.  HENT: Positive for ear pain and sore throat. Negative for congestion.   Respiratory: Negative for cough.   Gastrointestinal: Positive for abdominal pain and nausea. Negative for constipation, diarrhea and vomiting.  Genitourinary: Positive for  flank pain and pelvic pain. Negative for dysuria.  Musculoskeletal: Positive for back pain.  Neurological: Negative for headaches.   Other systems negative     Physical Exam   Patient Vitals for the past 24 hrs:  BP Temp Temp src Pulse Resp SpO2 Height Weight  07/02/17 1612 120/66 (!) 103.1 F (39.5 C) Oral (!) 115 19 96 % 5\' 2"  (1.575 m) 164 lb (74.4 kg)   Constitutional: Well-developed, well-nourished female in no acute distress, but flushed and ill -appearing.  HEENT:  Right ear canal erethematous, TM cloudy,  Left ear normal   Throat injected, no pus Cardiovascular: normal rate and rhythm, no ectopy audible, S1 & S2 heard,  no murmur Respiratory: normal effort, no distress. Lungs CTAB with no wheezes or crackles GI: Abd soft, tender over right side, upper and lower.  Nondistended.  No rebound, Mild guarding.  Bowel Sounds audible  MS: Extremities nontender, no edema, normal ROM Neurologic: Alert and oriented x 4.   Grossly nonfocal. GU: Right CVAT Skin:  Warm and Dry Psych:  Affect appropriate.  CT scan of abd and pelvis essentially normal with right ovarian cyst  A/P- 7 weeks s/p LAVH with fever, recently treated for E.coli UTI with Bactrim, now with fever and flank pain.  CT scan is negative, WBC normal, UA could still be c/w UTI but is contaminated, physical findings c/w pyelonephritis, no other obvious source of infection.   She received first dose of Rocephin at 1800 yesterday, feeling better this morning but did have another fever of 101.6 at 0335.   I am going to continue for now with working diagnosis of pyelonephritis, will continue Rocephin and monitor temp, recheck labs at 1200 today.

## 2017-07-03 NOTE — Progress Notes (Signed)
Patient called nurse to room to discuss plan of care with spouse. Advised patient IV antibiotics and expectant management until MD rounds today. Patient states overall she feels better but still is having chills and aches. Patient will order breakfast and await MD rounds.

## 2017-07-04 NOTE — Discharge Summary (Signed)
Physician Discharge Summary  Patient ID: [name] MRN: [id number] DOB/AGE: 32/05/1985 31 y.o.  Admit date: 07/02/2017 Discharge date: 07/04/2017  Admission Diagnoses:  Febrile morbidity, suspect pyelonephritis  Discharge Diagnoses: Same Active Problems:   Postoperative fever   Discharged Condition: good  Hospital Course: Pt admitted 7 weeks post-op from [facility] with fever and flank pain.  CT scan without evidence of abscess.  She was taking Bactrim for E. Coli UTI, UA still possibly c/w UTI though contaminated.  She had temp of 103 in MAU.  She was admitted and given Rocephin.  Pain and fever improved, and she was felt to be stable enough for discharge after receiving her second dose of rocephin.   Discharge Exam: Blood pressure 113/77, pulse 77, temperature 98.1 F (36.7 C), temperature source Oral, resp. rate 16, height 5\' 2"  (1.575 m), weight 74.4 kg (164 lb), last menstrual period 04/20/2017, SpO2 100 %. General appearance: alert  Disposition:   Discharge Instructions    Call MD for:  severe uncontrolled pain   Complete by:  As directed    Call MD for:  temperature >100.4   Complete by:  As directed    Diet - low sodium heart healthy   Complete by:  As directed    Increase activity slowly   Complete by:  As directed      Allergies as of 07/03/2017   No Known Allergies     Medication List    STOP taking these medications   cetirizine 10 MG tablet Commonly known as:  ZYRTEC   ibuprofen 800 MG tablet Commonly known as:  ADVIL,MOTRIN   oxyCODONE 5 MG immediate release tablet Commonly known as:  Oxy IR/ROXICODONE   sulfamethoxazole-trimethoprim 800-160 MG tablet Commonly known as:  BACTRIM DS,SEPTRA DS     TAKE these medications   cephALEXin 500 MG capsule Commonly known as:  KEFLEX Take 1 capsule (500 mg total) by mouth 3 (three) times daily for 7 days.   naproxen 500 MG tablet Commonly known as:  NAPROSYN Take 500 mg by mouth 2 (two) times daily as  needed.   oxybutynin 5 MG 24 hr tablet Commonly known as:  DITROPAN-XL Take 5 mg by mouth daily.   valACYclovir 500 MG tablet Commonly known as:  VALTREX Take 500 mg by mouth 2 (two) times daily as needed. For fever blister/cold sores      Follow-up Information    Bovard-Stuckert, Jody, MD. Schedule an appointment as soon as possible for a visit in 5 day(s).   Specialty:  Obstetrics and Gynecology Contact information: [address] [place] [address] [phone]           Signed: Leighton Roachodd D Aamna Mallozzi 07/04/2017, 9:47 AM

## 2017-07-06 DIAGNOSIS — B373 Candidiasis of vulva and vagina: Secondary | ICD-10-CM | POA: Diagnosis not present

## 2017-07-06 DIAGNOSIS — N939 Abnormal uterine and vaginal bleeding, unspecified: Secondary | ICD-10-CM | POA: Diagnosis not present

## 2017-07-06 DIAGNOSIS — R109 Unspecified abdominal pain: Secondary | ICD-10-CM | POA: Diagnosis not present

## 2017-07-06 DIAGNOSIS — N10 Acute tubulo-interstitial nephritis: Secondary | ICD-10-CM | POA: Diagnosis not present

## 2017-07-06 MED FILL — FLUCONAZOLE 150 MG TABS: 150 | 10 days supply | Qty: 3 | Fill #0

## 2017-07-06 MED FILL — AMOX-CLAV 875-125 MG TABLET: 875-125 | 7 days supply | Qty: 14 | Fill #0

## 2017-07-08 DIAGNOSIS — Z049 Encounter for examination and observation for unspecified reason: Secondary | ICD-10-CM | POA: Diagnosis not present

## 2017-07-08 DIAGNOSIS — G43839 Menstrual migraine, intractable, without status migrainosus: Secondary | ICD-10-CM | POA: Diagnosis not present

## 2017-07-08 DIAGNOSIS — G43719 Chronic migraine without aura, intractable, without status migrainosus: Secondary | ICD-10-CM | POA: Diagnosis not present

## 2017-07-08 MED FILL — ZONISAMIDE 25 MG CAPSULE: 25 | 30 days supply | Qty: 120 | Fill #0

## 2017-07-08 MED FILL — BACLOFEN 10 MG TABLET: 10 | 10 days supply | Qty: 20 | Fill #0

## 2017-08-03 ENCOUNTER — Encounter (HOSPITAL_COMMUNITY): Payer: Self-pay | Admitting: Emergency Medicine

## 2017-08-03 ENCOUNTER — Emergency Department (HOSPITAL_COMMUNITY)
Admission: EM | Admit: 2017-08-03 | Discharge: 2017-08-03 | Disposition: A | Discharge status: Home / Self Care | Payer: 59 | Attending: Emergency Medicine | Admitting: Emergency Medicine

## 2017-08-03 ENCOUNTER — Other Ambulatory Visit: Payer: Self-pay

## 2017-08-03 DIAGNOSIS — R21 Rash and other nonspecific skin eruption: Secondary | ICD-10-CM | POA: Diagnosis present

## 2017-08-03 DIAGNOSIS — T782XXA Anaphylactic shock, unspecified, initial encounter: Secondary | ICD-10-CM | POA: Insufficient documentation

## 2017-08-03 DIAGNOSIS — R220 Localized swelling, mass and lump, head: Secondary | ICD-10-CM | POA: Diagnosis not present

## 2017-08-03 DIAGNOSIS — Z79899 Other long term (current) drug therapy: Secondary | ICD-10-CM | POA: Insufficient documentation

## 2017-08-03 DIAGNOSIS — R000 Tachycardia, unspecified: Secondary | ICD-10-CM | POA: Insufficient documentation

## 2017-08-03 DIAGNOSIS — T886XXA Anaphylactic reaction due to adverse effect of correct drug or medicament properly administered, initial encounter: Secondary | ICD-10-CM | POA: Diagnosis not present

## 2017-08-03 DIAGNOSIS — R0602 Shortness of breath: Secondary | ICD-10-CM | POA: Diagnosis not present

## 2017-08-03 LAB — BASIC METABOLIC PANEL
Anion gap: 9 (ref 5–15)
BUN: 10 mg/dL (ref 6–20)
CO2: 20 mmol/L — ABNORMAL LOW (ref 22–32)
Calcium: 9.1 mg/dL (ref 8.9–10.3)
Chloride: 106 mmol/L (ref 98–111)
Creatinine, Ser: 1.06 mg/dL — ABNORMAL HIGH (ref 0.44–1.00)
GFR calc Af Amer: >60 mL/min (ref >60)
GFR calc non Af Amer: >60 mL/min (ref >60)
Glucose, Bld: 125 mg/dL — ABNORMAL HIGH (ref 70–99)
Potassium: 4 mmol/L (ref 3.5–5.1)
Sodium: 135 mmol/L (ref 135–145)

## 2017-08-03 LAB — URINALYSIS, ROUTINE W REFLEX MICROSCOPIC
Bilirubin Urine: NEGATIVE
Glucose, UA: NEGATIVE mg/dL
Ketones, ur: NEGATIVE mg/dL
Leukocytes, UA: NEGATIVE
Nitrite: POSITIVE — AB
Protein, ur: NEGATIVE mg/dL
Specific Gravity, Urine: 1.02 (ref 1.005–1.030)
pH: 5 (ref 5.0–8.0)

## 2017-08-03 LAB — CBC WITH DIFFERENTIAL/PLATELET
Abs Immature Granulocytes: 0.1 10*3/uL (ref 0.0–0.1)
Basophils Absolute: 0 10*3/uL (ref 0.0–0.1)
Basophils Relative: 0 %
Eosinophils Absolute: 0.2 10*3/uL (ref 0.0–0.7)
Eosinophils Relative: 2 %
HCT: 40.2 % (ref 36.0–46.0)
Hemoglobin: 13.1 g/dL (ref 12.0–15.0)
Immature Granulocytes: 0 %
Lymphocytes Relative: 4 %
Lymphs Abs: 0.5 10*3/uL — ABNORMAL LOW (ref 0.7–4.0)
MCH: 29.8 pg (ref 26.0–34.0)
MCHC: 32.6 g/dL (ref 30.0–36.0)
MCV: 91.4 fL (ref 78.0–100.0)
Monocytes Absolute: 0.4 10*3/uL (ref 0.1–1.0)
Monocytes Relative: 3 %
Neutro Abs: 11.5 10*3/uL — ABNORMAL HIGH (ref 1.7–7.7)
Neutrophils Relative %: 91 %
Platelets: 268 10*3/uL (ref 150–400)
RBC: 4.4 MIL/uL (ref 3.87–5.11)
RDW: 11.9 % (ref 11.5–15.5)
WBC: 12.6 10*3/uL — ABNORMAL HIGH (ref 4.0–10.5)

## 2017-08-03 MED ORDER — DIPHENHYDRAMINE HCL 25 MG PO TABS: 50.0000 mg | ORAL_TABLET | ORAL | 0 refills | Status: DC

## 2017-08-03 MED ORDER — DIPHENHYDRAMINE HCL 25 MG PO CAPS
50.0000 mg | ORAL_CAPSULE | Freq: Once | ORAL | Status: AC
  Administered 2017-08-03: 50 mg via ORAL
  Filled 2017-08-03: qty 2

## 2017-08-03 MED ORDER — PREDNISONE 20 MG PO TABS: ORAL_TABLET | ORAL | 0 refills | Status: DC

## 2017-08-03 MED ORDER — SODIUM CHLORIDE 0.9 % IV BOLUS
1000.0000 mL | Freq: Once | INTRAVENOUS | Status: AC
  Administered 2017-08-03: 1000 mL via INTRAVENOUS

## 2017-08-03 MED ORDER — EPINEPHRINE 0.3 MG/0.3ML IJ SOAJ: 0.3000 mg | Freq: Once | INTRAMUSCULAR | 1 refills | Status: AC

## 2017-08-03 MED ORDER — EPINEPHRINE 0.3 MG/0.3ML IJ SOAJ
0.3000 mg | Freq: Once | INTRAMUSCULAR | Status: AC
Start: 2017-08-03 — End: 2017-08-03
  Administered 2017-08-03: 0.3 mg via INTRAMUSCULAR
  Filled 2017-08-03: qty 0.3

## 2017-08-03 MED ORDER — PREDNISONE 20 MG PO TABS
60.0000 mg | ORAL_TABLET | Freq: Once | ORAL | Status: AC
  Administered 2017-08-03: 60 mg via ORAL
  Filled 2017-08-03: qty 3

## 2017-08-03 MED FILL — EPINEPHRINE 0.3 MG AUTO-INJ: 0.3 | 30 days supply | Qty: 2 | Fill #0

## 2017-08-03 MED FILL — predniSONE 20 MG TABS: 20 | 4 days supply | Qty: 8 | Fill #0

## 2017-08-03 NOTE — Discharge Instructions (Signed)
If your symptoms worsen or you develop trouble breathing, lip or tongue swelling, trouble speaking, or other new/concerning symptoms or return to the ER for evaluation.

## 2017-08-03 NOTE — ED Notes (Signed)
ED Provider at bedside. 

## 2017-08-03 NOTE — ED Notes (Signed)
Pt states that "tingling is gone" since epi-pen administration. Will continue to monitor.

## 2017-08-03 NOTE — ED Notes (Signed)
Pt reports that she experienced facial edema and tongue edema this morning. Pt reports SOB. Took 50mg  benadryl last night immediately after reaction to medicine. Denies taking any more benadryl since her first dose last night.

## 2017-08-03 NOTE — ED Provider Notes (Signed)
[facility] EMERGENCY DEPARTMENT Provider Note   CSN: [id number] Arrival date & time: 08/03/17  0720     History   Chief Complaint Chief Complaint  Patient presents with  . Allergic Reaction  . Rash  . Shortness of Breath    HPI [name] is a 32 y.o. female.  HPI  32 year old female presents with concern for allergic reaction.  She took Azo last night around 9 PM because she was concerned she was developing a UTI.  She is been having cloudy urine and has had a bad experience with UTI in the past.  She has been peeing more frequently but states she is also drinking more water.  However no urgency or dysuria.  About 5 minutes after taking this medicine, she developed a rash and tingling.  This morning when she woke up it feels like her upper lip is swollen like she is had Novocain but also she can see swelling when looking in the mirror.  She feels short of breath and has noticed some scratchy throat.  No vomiting or nausea/abdominal pain.  She took Benadryl last night around 10 PM, 50 mg.  This is not helped.  Past Medical History:  Diagnosis Date  . Abnormal Pap smear   . Anemia   . BV (bacterial vaginosis)   . Cyst   . 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 Headache(784.0)    otc med prn  . History of blood transfusion 05/2011   at [facility]  . History of kidney stones    passed stone no surgery required  . History of renal stone   . HSV infection   . Lactating mother 07/04/2011  . Migraine   . Ovarian cyst   . Panic attack    no meds  . S/P laparoscopic assisted vaginal hysterectomy (LAVH) 05/12/2017  . Seasonal allergies     Patient Active Problem List   Diagnosis Date Noted  . Postoperative fever 07/02/2017  . S/P laparoscopic assisted vaginal hysterectomy (LAVH) 05/12/2017  . 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 Hordeolum externum of right lower eyelid 05/22/2016  . Abscess of right axilla 12/01/2012  . Lactating mother  07/04/2011  . Postpartum hypertension 07/04/2011  . Postoperative ileus (HCC) 07/03/2011  . Post-op bleeding - s/p exploratory laparotomy  06/27/2011  . Status post repeat low transverse cesarean section 06/24/2011  . Pregnant state, incidental 05/21/2011  . H/O cesarean section 05/21/2011  . Overweight(278.02) 05/21/2011  .  H/O Renal stones 11/06/2010  . Previous  C-section plans repeat  11/06/2010  . Migraines 06/06/2010    Past Surgical History:  Procedure Laterality Date  . ABDOMINAL HYSTERECTOMY    . CESAREAN SECTION  03/2009  . CESAREAN SECTION  06/24/2011   Procedure: CESAREAN SECTION;  Surgeon: Esmeralda Arthur, MD;  Location: [facility];  Service: Gynecology;  Laterality: N/A;  . LAPAROSCOPIC ASSISTED VAGINAL HYSTERECTOMY  05/12/2017   Procedure: LAPAROSCOPIC ASSISTED VAGINAL HYSTERECTOMY;  Surgeon: Sherian Rein, MD;  Location: [facility];  Service: Gynecology;;  . LAPAROSCOPIC LYSIS OF ADHESIONS  05/12/2017   Procedure: EXTENSIVE LAPAROSCOPIC LYSIS OF ADHESIONS;  Surgeon: Sherian Rein, MD;  Location: [facility];  Service: Gynecology;;  . LAPAROTOMY  06/25/2011   Procedure: EXPLORATORY LAPAROTOMY; Repair ileus  Surgeon: Purcell Nails, MD;  Location: [facility];  Service: Gynecology;  Laterality: N/A;   . WISDOM TOOTH EXTRACTION  2008, 02/2010     OB History    Gravida  2  Para  2   Term  2   Preterm      AB      Living  2     SAB      TAB      Ectopic      Multiple      Live Births  2            Home Medications    Prior to Admission medications   Medication Sig Start Date End Date Taking? Authorizing Provider  ibuprofen (ADVIL,MOTRIN) 800 MG tablet Take 800 mg by mouth every 8 (eight) hours as needed for moderate pain.   Yes [provider]  zonisamide (ZONEGRAN) 25 MG capsule Take 100 mg by mouth daily. 07/08/17  Yes [provider]  diphenhydrAMINE (BENADRYL) 25 MG tablet Take 2 tablets (50 mg total) by mouth every 4 (four)  hours. 08/03/17   Pricilla Loveless, MD  EPINEPHrine (EPIPEN 2-PAK) 0.3 mg/0.3 mL IJ SOAJ injection Inject 0.3 mLs (0.3 mg total) into the muscle once for 1 dose. 08/03/17 08/03/17  Pricilla Loveless, MD  predniSONE (DELTASONE) 20 MG tablet 2 tabs po daily x 4 days 08/04/17   Pricilla Loveless, MD    Family History Family History  Problem Relation Age of Onset  . Hypertension Mother   . Thrombophlebitis Mother   . Anemia Mother   . Heart disease Father   . Hypertension Father   . Diabetes Father   . Kidney disease Father   . Sickle cell trait Sister   . Asperger's syndrome Brother   . Lupus Maternal Aunt   . Lupus Paternal Aunt   . Heart attack Maternal Grandmother   . Thrombophlebitis Maternal Grandmother   . Transient ischemic attack Maternal Grandmother   . Heart disease Maternal Grandmother        MI  . Alzheimer's disease Maternal Grandfather   . Bipolar disorder Maternal Grandfather   . Heart murmur Maternal Grandfather   . Mental illness Maternal Grandfather   . Hypotension Neg Hx   . Anesthesia problems Neg Hx   . Malignant hyperthermia Neg Hx   . Pseudochol deficiency Neg Hx     Social History Social History   Tobacco Use  . Smoking status: Never Smoker  . Smokeless tobacco: Never Used  Substance Use Topics  . Alcohol use: No  . Drug use: No     Allergies   Azo [phenazopyridine]   Review of Systems Review of Systems  HENT: Positive for facial swelling and sore throat (itchy). Negative for voice change.   Respiratory: Positive for shortness of breath.   Gastrointestinal: Negative for abdominal pain, nausea and vomiting.  Genitourinary: Negative for dysuria and urgency.  Skin: Positive for rash.  All other systems reviewed and are negative.    Physical Exam Updated Vital Signs BP 103/62   Pulse (!) 101   Temp 98.6 F (37 C) (Oral)   Resp (!) 30   Ht 5\' 2"  (1.575 m)   Wt 72.6 kg (160 lb)   LMP 04/20/2017 (Exact Date)   SpO2 96%   BMI 29.26 kg/m    Physical Exam  Constitutional: She is oriented to person, place, and time. She appears well-developed and well-nourished.  Non-toxic appearance. She does not appear ill.  HENT:  Head: Normocephalic and atraumatic.  Right Ear: External ear normal.  Left Ear: External ear normal.  Nose: Nose normal.  Normal voice, no stridor I do not appreciate any oropharyngeal swelling, including lips and tongue.  Eyes: Right eye exhibits no discharge. Left eye exhibits no discharge.  Neck: Neck supple.  Cardiovascular: Regular rhythm and normal heart sounds. Tachycardia present.  Pulmonary/Chest: Effort normal and breath sounds normal. No accessory muscle usage. No tachypnea. She has no wheezes.  Abdominal: Soft. There is no tenderness.  Neurological: She is alert and oriented to person, place, and time.  Skin: Skin is warm and dry. Rash noted. Rash is urticarial (mostly on all 4 extremities). She is not diaphoretic.  Nursing note and vitals reviewed.    ED Treatments / Results  Labs (all labs ordered are listed, but only abnormal results are displayed) Labs Reviewed  URINALYSIS, ROUTINE W REFLEX MICROSCOPIC - Abnormal; Notable for the following components:      Result Value   Color, Urine AMBER (*)    Hgb urine dipstick SMALL (*)    Nitrite POSITIVE (*)    Bacteria, UA RARE (*)    All other components within normal limits  BASIC METABOLIC PANEL - Abnormal; Notable for the following components:   CO2 20 (*)    Glucose, Bld 125 (*)    Creatinine, Ser 1.06 (*)    All other components within normal limits  CBC WITH DIFFERENTIAL/PLATELET - Abnormal; Notable for the following components:   WBC 12.6 (*)    Neutro Abs 11.5 (*)    Lymphs Abs 0.5 (*)    All other components within normal limits    EKG None  Radiology No results found.  Procedures Procedures (including critical care time)  Medications Ordered in ED Medications  sodium chloride 0.9 % bolus 1,000 mL (0 mLs Intravenous  Stopped 08/03/17 1018)  diphenhydrAMINE (BENADRYL) capsule 50 mg (50 mg Oral Given 08/03/17 0804)  predniSONE (DELTASONE) tablet 60 mg (60 mg Oral Given 08/03/17 0804)  EPINEPHrine (EPI-PEN) injection 0.3 mg (0.3 mg Intramuscular Given 08/03/17 0815)     Initial Impression / Assessment and Plan / ED Course  I have reviewed the triage vital signs and the nursing notes.  Pertinent labs & imaging results that were available during my care of the patient were reviewed by me and considered in my medical decision making (see chart for details).     Patient is feeling better after treatment.  She technically meets criteria for anaphylaxis with the shortness of breath which was more noticeable when walking to the bathroom as well as skin findings with rash and possible lip swelling.  However she is not in shock or showing other signs of severe illness.  Given all this she was given Benadryl, prednisone and epinephrine.  The tingling is odd and thus labs obtained but no clear electrolyte disturbance.  This is probably related to the skin findings.  She will need to continue prednisone and Benadryl at home and will be given prescription for EpiPen.  She appears stable for discharge home to follow-up with PCP.  Return precautions.  Final Clinical Impressions(s) / ED Diagnoses   Final diagnoses:  Anaphylaxis, initial encounter    ED Discharge Orders        Ordered    predniSONE (DELTASONE) 20 MG tablet     08/03/17 1110    diphenhydrAMINE (BENADRYL) 25 MG tablet  Every 4 hours     08/03/17 1110    EPINEPHrine (EPIPEN 2-PAK) 0.3 mg/0.3 mL IJ SOAJ injection   Once     08/03/17 1110       Pricilla LovelessGoldston, Gibson Lad, MD 08/03/17 1741

## 2017-08-03 NOTE — ED Triage Notes (Signed)
Pt. Stated, her husband had bought her some cranberry extra strength and 30 min later I felt like I was on fire. This morning I have a rash everywhere and flushed. MY tongue felt a little swollen and a little SOB. Came to work and told to come down here.

## 2017-08-03 NOTE — ED Notes (Signed)
Patient ambulatory to bathroom with steady gait at this time 

## 2017-09-09 MED FILL — VALACYCLOVIR HCL 500 MG TAB: 500 | 10 days supply | Qty: 20 | Fill #1

## 2017-10-17 ENCOUNTER — Ambulatory Visit (INDEPENDENT_AMBULATORY_CARE_PROVIDER_SITE_OTHER): Payer: Self-pay | Admitting: Family Medicine

## 2017-10-17 VITALS — BP 122/80 | HR 70 | Temp 98.8°F | Resp 17 | Wt 166.2 lb

## 2017-10-17 DIAGNOSIS — B373 Candidiasis of vulva and vagina: Secondary | ICD-10-CM

## 2017-10-17 DIAGNOSIS — B3731 Acute candidiasis of vulva and vagina: Secondary | ICD-10-CM

## 2017-10-17 MED ORDER — FLUCONAZOLE 150 MG PO TABS: 150.0000 mg | ORAL_TABLET | Freq: Once | ORAL | 0 refills | Status: AC

## 2017-10-17 NOTE — Progress Notes (Signed)
[name] is a 32 y.o. female who presents today with concerns off vaginal yeast infection. She reports that she has been experiencing symptoms for 2 days. She denies risk for STI/STD and foul odor. She reports a thick white consistency discharge and clear but denies vaginal itching. She denies any lesions in the vaginal area. Of note she reports a hysterectomy earlier this year and describes the discharge to that of ovulation clear sticky discharge.  Review of Systems  Constitutional: Negative for chills, fever and malaise/fatigue.  HENT: Negative for congestion, ear discharge, ear pain, sinus pain and sore throat.   Eyes: Negative.   Respiratory: Negative for cough, sputum production and shortness of breath.   Cardiovascular: Negative.  Negative for chest pain.  Gastrointestinal: Negative for abdominal pain, diarrhea, nausea and vomiting.  Genitourinary: Negative for dysuria, frequency, hematuria and urgency.       Vaginal discharge x 2 days   Musculoskeletal: Negative for myalgias.  Skin: Negative.   Neurological: Negative for headaches.  Endo/Heme/Allergies: Negative.   Psychiatric/Behavioral: Negative.     O: Vitals:   10/17/17 1553  BP: 122/80  Pulse: 70  Resp: 17  Temp: 98.8 F (37.1 C)  SpO2: 97%     Physical Exam  Constitutional: She is oriented to person, place, and time. Vital signs are normal. She appears well-developed and well-nourished. She is active.  Non-toxic appearance. She does not have a sickly appearance.  HENT:  Head: Normocephalic.  Right Ear: Hearing, tympanic membrane, external ear and ear canal normal.  Left Ear: Hearing, tympanic membrane, external ear and ear canal normal.  Nose: Nose normal.  Mouth/Throat: Uvula is midline and oropharynx is clear and moist.  Neck: Normal range of motion. Neck supple.  Cardiovascular: Normal rate, regular rhythm, normal heart sounds and normal pulses.  Pulmonary/Chest: Effort normal and breath sounds  normal.  Abdominal: Soft. Bowel sounds are normal.  Musculoskeletal: Normal range of motion.  Lymphadenopathy:       Head (right side): No submental and no submandibular adenopathy present.       Head (left side): No submental and no submandibular adenopathy present.    She has no cervical adenopathy.  Neurological: She is alert and oriented to person, place, and time.  Psychiatric: She has a normal mood and affect.  Vitals reviewed.  A: 1. Vaginal yeast infection    P: Discussed exam findings, diagnosis etiology and medication use and indications reviewed with patient. Follow- Up and discharge instructions provided. No emergent/urgent issues found on exam.  Patient verbalized understanding of information provided and agrees with plan of care (POC), all questions answered.  1. Vaginal yeast infection Will trial advised additional follow up with GYN- patient agrees - fluconazole (DIFLUCAN) 150 MG tablet; Take 1 tablet (150 mg total) by mouth once for 1 dose.

## 2017-10-17 NOTE — Patient Instructions (Signed)

## 2018-02-23 DIAGNOSIS — Z01419 Encounter for gynecological examination (general) (routine) without abnormal findings: Secondary | ICD-10-CM | POA: Diagnosis not present

## 2018-02-23 DIAGNOSIS — Z1389 Encounter for screening for other disorder: Secondary | ICD-10-CM | POA: Diagnosis not present

## 2018-02-23 DIAGNOSIS — Z130 Encounter for screening for diseases of the blood and blood-forming organs and certain disorders involving the immune mechanism: Secondary | ICD-10-CM | POA: Diagnosis not present

## 2018-02-23 DIAGNOSIS — Z6831 Body mass index (BMI) 31.0-31.9, adult: Secondary | ICD-10-CM | POA: Diagnosis not present

## 2018-03-09 ENCOUNTER — Ambulatory Visit (INDEPENDENT_AMBULATORY_CARE_PROVIDER_SITE_OTHER): Payer: Self-pay | Admitting: Nurse Practitioner

## 2018-03-09 VITALS — BP 100/75 | HR 92 | Temp 101.7°F | Resp 18 | Wt 173.3 lb

## 2018-03-09 DIAGNOSIS — R6889 Other general symptoms and signs: Secondary | ICD-10-CM

## 2018-03-09 DIAGNOSIS — J101 Influenza due to other identified influenza virus with other respiratory manifestations: Secondary | ICD-10-CM

## 2018-03-09 LAB — POCT INFLUENZA A/B
Influenza A, POC: POSITIVE — AB
Influenza B, POC: NEGATIVE

## 2018-03-09 MED ORDER — FLUTICASONE PROPIONATE 50 MCG/ACT NA SUSP: 2.0000 | Freq: Every day | NASAL | 0 refills | Status: DC

## 2018-03-09 MED ORDER — OSELTAMIVIR PHOSPHATE 75 MG PO CAPS: 75.0000 mg | ORAL_CAPSULE | Freq: Two times a day (BID) | ORAL | 0 refills | Status: AC

## 2018-03-09 MED ORDER — PROMETHAZINE-DM 6.25-15 MG/5ML PO SYRP: 5.0000 mL | ORAL_SOLUTION | Freq: Four times a day (QID) | ORAL | 0 refills | Status: AC | PRN

## 2018-03-09 MED FILL — PROMETHAZINE W/DM SYRUP: 6.25-15 | 7 days supply | Qty: 140 | Fill #0

## 2018-03-09 MED FILL — FLUTICASONE PROP 50 MCG SPR: 50 | 30 days supply | Qty: 16 | Fill #0

## 2018-03-09 MED FILL — OSELTAMIVIR PHOSPHATE 75 MG: 75 | 5 days supply | Qty: 10 | Fill #0

## 2018-03-09 NOTE — Progress Notes (Addendum)
Subjective:     [name] is a 33 y.o. female who presents for evaluation of influenza like symptoms. Symptoms include fevers up to 100.6 degrees, chills, dry cough, bilateral ear fullness, headache, myalgias, productive cough, sinus and nasal congestion, bilateral sinus pain, sore throat and fatigue,myalgia and have been present for 1 day.  The patient denies productive cough, abdominal pain, nausea, vomiting. She has tried to alleviate the symptoms with Nyquil and Dayquil with minimal relief. Patient believes she developed her symptoms after her sister visited from [place] this past weekend.  High risk factors for influenza complications: none.  Patient informs she has had an influenza vaccine this season.  The following portions of the patient's history were reviewed and updated as appropriate: allergies, current medications and past medical history.  Past Medical History:  Diagnosis Date  . Abnormal Pap smear   . Anemia   . BV (bacterial vaginosis)   . Cyst   . 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 Headache(784.0)    otc med prn  . History of blood transfusion 05/2011   at [facility]  . History of kidney stones    passed stone no surgery required  . History of renal stone   . HSV infection   . Lactating mother 07/04/2011  . Migraine   . Ovarian cyst   . Panic attack    no meds  . S/P laparoscopic assisted vaginal hysterectomy (LAVH) 05/12/2017  . Seasonal allergies     Review of Systems Constitutional: positive for anorexia, chills, fatigue, fevers and malaise, negative for weight loss Eyes: negative Ears, nose, mouth, throat, and face: positive for nasal congestion, sore throat and sinus pressure/tenderness, negative for ear drainage, earaches and hoarseness Respiratory: positive for cough, negative for asthma, chronic bronchitis, dyspnea on exertion, pneumonia, sputum, stridor and wheezing Cardiovascular: negative Gastrointestinal: negative Neurological: positive for  headaches, negative for coordination problems, dizziness, gait problems, paresthesia and weakness     Objective:    BP 100/75 (BP Location: Right Arm, Patient Position: Sitting, Cuff Size: Normal)   Pulse 92   Temp (!) 101.7 F (38.7 C) (Oral)   Resp 18   Wt 173 lb 4.8 oz (78.6 kg)   LMP 04/20/2017 (Exact Date)   SpO2 97%   BMI 31.70 kg/m    Physical Exam Vitals signs reviewed.  Constitutional:      General: She is not in acute distress.    Comments: Appears uncomfortable  HENT:     Head: Normocephalic.     Right Ear: Tympanic membrane, ear canal and external ear normal. No middle ear effusion.     Left Ear: Tympanic membrane, ear canal and external ear normal.  No middle ear effusion.     Nose: Congestion (moderate) present. No nasal deformity.     Right Turbinates: Enlarged and swollen.     Left Turbinates: Enlarged and swollen.     Right Sinus: Maxillary sinus tenderness (moderate) and frontal sinus tenderness (moderate) present.     Left Sinus: Maxillary sinus tenderness (moderate) and frontal sinus tenderness (moderate) present.     Mouth/Throat:     Lips: Pink.     Pharynx: Pharyngeal swelling and posterior oropharyngeal erythema present. No oropharyngeal exudate or uvula swelling.     Tonsils: No tonsillar exudate. Swelling: 1+ on the right. 1+ on the left.  Neck:     Musculoskeletal: Normal range of motion.  Cardiovascular:     Rate and Rhythm: Normal rate and regular rhythm.  Pulses: Normal pulses.     Heart sounds: Normal heart sounds.  Pulmonary:     Effort: Pulmonary effort is normal. No respiratory distress.     Breath sounds: Normal breath sounds. No stridor. No wheezing or rales.  Abdominal:     General: Bowel sounds are normal.     Palpations: Abdomen is soft.  Lymphadenopathy:     Cervical: Cervical adenopathy (bilateral superficial) present.  Skin:    General: Skin is warm and dry.     Capillary Refill: Capillary refill takes less than 2 seconds.   Neurological:     General: No focal deficit present.     Mental Status: She is alert and oriented to person, place, and time.     Cranial Nerves: No cranial nerve deficit.  Psychiatric:        Mood and Affect: Mood normal.        Thought Content: Thought content normal.     Assessment:    Influenza A   Plan:   Exam findings, diagnosis etiology and medication use and indications reviewed with patient. Follow- Up and discharge instructions provided. No emergent/urgent issues found on exam.  Based on the patient's clinical presentation, symptoms, symptom duration, and positive influenza A test, patient symptoms are congruent with that of influenza A.  Patient has opted to use Tamiflu for her symptoms.  We will also prescribe symptomatic treatment to include Promethazine DM for her cough and fluticasone for her sinus symptoms.  Patient was instructed to remain home until she has been fever free for at least 24 hours.  Patient also instructed to get plenty rest and increase fluids.  Patient education was provided. Patient verbalized understanding of information provided and agrees with plan of care (POC), all questions answered. The patient is advised to call or return to clinic if condition does not see an improvement in symptoms, or to seek the care of the closest emergency department if condition worsens with the above plan.   1. Flu-like symptoms  - POCT Influenza A/B  2. Influenza A  - oseltamivir (TAMIFLU) 75 MG capsule; Take 1 capsule (75 mg total) by mouth 2 (two) times daily for 5 days.  Dispense: 10 capsule; Refill: 0 - promethazine-dextromethorphan (PROMETHAZINE-DM) 6.25-15 MG/5ML syrup; Take 5 mLs by mouth 4 (four) times daily as needed for up to 7 days.  Dispense: 140 mL; Refill: 0 - fluticasone (FLONASE) 50 MCG/ACT nasal spray; Place 2 sprays into both nostrils daily for 10 days.  Dispense: 16 g; Refill: 0 -Take medication as prescribed. -Ibuprofen or Tylenol for pain, fever, or  general discomfort. -Increase fluids. -Get plenty of rest. -Sleep elevated on at least 2 pillows at bedtime to help with cough. -Use a humidifier or vaporizer when at home and during sleep. -May use a teaspoon of honey or over-the-counter cough drops to help with cough. -May use normal saline nasal spray to help with nasal congestion throughout the day. -Remain home until you have been fever-free for at least 24 hours. -Follow-up with your regular doctor if your symptoms do not improve.

## 2018-03-09 NOTE — Patient Instructions (Signed)
Influenza, Adult -Take medication as prescribed. -Ibuprofen or Tylenol for pain, fever, or general discomfort. -Increase fluids. -Get plenty of rest. -Sleep elevated on at least 2 pillows at bedtime to help with cough. -Use a humidifier or vaporizer when at home and during sleep. -May use a teaspoon of honey or over-the-counter cough drops to help with cough. -May use normal saline nasal spray to help with nasal congestion throughout the day. -Remain home until you have been fever-free for at least 24 hours. -Follow-up with your regular doctor if your symptoms do not improve.  fluenza, more commonly known as "the flu," is a viral infection that mainly affects the respiratory tract. The respiratory tract includes organs that help you breathe, such as the lungs, nose, and throat. The flu causes many symptoms similar to the common cold along with high fever and body aches. The flu spreads easily from person to person (is contagious). Getting a flu shot (influenza vaccination) every year is the best way to prevent the flu. What are the causes? This condition is caused by the influenza virus. You can get the virus by:  Breathing in droplets that are in the air from an infected person's cough or sneeze.  Touching something that has been exposed to the virus (has been contaminated) and then touching your mouth, nose, or eyes. What increases the risk? The following factors may make you more likely to get the flu:  Not washing or sanitizing your hands often.  Having close contact with many people during cold and flu season.  Touching your mouth, eyes, or nose without first washing or sanitizing your hands.  Not getting a yearly (annual) flu shot. You may have a higher risk for the flu, including serious problems such as a lung infection (pneumonia), if you:  Are older than 65.  Are pregnant.  Have a weakened disease-fighting system (immune system). You may have a weakened immune system if  you: ? Have HIV or AIDS. ? Are undergoing chemotherapy. ? Are taking medicines that reduce (suppress) the activity of your immune system.  Have a long-term (chronic) illness, such as heart disease, kidney disease, diabetes, or lung disease.  Have a liver disorder.  Are severely overweight (morbidly obese).  Have anemia. This is a condition that affects your red blood cells.  Have asthma. What are the signs or symptoms? Symptoms of this condition usually begin suddenly and last 4-14 days. They may include:  Fever and chills.  Headaches, body aches, or muscle aches.  Sore throat.  Cough.  Runny or stuffy (congested) nose.  Chest discomfort.  Poor appetite.  Weakness or fatigue.  Dizziness.  Nausea or vomiting. How is this diagnosed? This condition may be diagnosed based on:  Your symptoms and medical history.  A physical exam.  Swabbing your nose or throat and testing the fluid for the influenza virus. How is this treated? If the flu is diagnosed early, you can be treated with medicine that can help reduce how severe the illness is and how long it lasts (antiviral medicine). This may be given by mouth (orally) or through an IV. Taking care of yourself at home can help relieve symptoms. Your health care provider may recommend:  Taking over-the-counter medicines.  Drinking plenty of fluids. In many cases, the flu goes away on its own. If you have severe symptoms or complications, you may be treated in a hospital. Follow these instructions at home: Activity  Rest as needed and get plenty of sleep.  Stay home  from work or school as told by your health care provider. Unless you are visiting your health care provider, avoid leaving home until your fever has been gone for 24 hours without taking medicine. Eating and drinking  Take an oral rehydration solution (ORS). This is a drink that is sold at pharmacies and retail stores.  Drink enough fluid to keep your  urine pale yellow.  Drink clear fluids in small amounts as you are able. Clear fluids include water, ice chips, diluted fruit juice, and low-calorie sports drinks.  Eat bland, easy-to-digest foods in small amounts as you are able. These foods include bananas, applesauce, rice, lean meats, toast, and crackers.  Avoid drinking fluids that contain a lot of sugar or caffeine, such as energy drinks, regular sports drinks, and soda.  Avoid alcohol.  Avoid spicy or fatty foods. General instructions      Take over-the-counter and prescription medicines only as told by your health care provider.  Use a cool mist humidifier to add humidity to the air in your home. This can make it easier to breathe.  Cover your mouth and nose when you cough or sneeze.  Wash your hands with soap and water often, especially after you cough or sneeze. If soap and water are not available, use alcohol-based hand sanitizer.  Keep all follow-up visits as told by your health care provider. This is important. How is this prevented?   Get an annual flu shot. You may get the flu shot in late summer, fall, or winter. Ask your health care provider when you should get your flu shot.  Avoid contact with people who are sick during cold and flu season. This is generally fall and winter. Contact a health care provider if:  You develop new symptoms.  You have: ? Chest pain. ? Diarrhea. ? A fever.  Your cough gets worse.  You produce more mucus.  You feel nauseous or you vomit. Get help right away if:  You develop shortness of breath or difficulty breathing.  Your skin or nails turn a bluish color.  You have severe pain or stiffness in your neck.  You develop a sudden headache or sudden pain in your face or ear.  You cannot eat or drink without vomiting. Summary  Influenza, more commonly known as "the flu," is a viral infection that primarily affects your respiratory tract.  Symptoms of the flu usually  begin suddenly and last 4-14 days.  Getting an annual flu shot is the best way to prevent getting the flu.  Stay home from work or school as told by your health care provider. Unless you are visiting your health care provider, avoid leaving home until your fever has been gone for 24 hours without taking medicine.  Keep all follow-up visits as told by your health care provider. This is important. This information is not intended to replace advice given to you by your health care provider. Make sure you discuss any questions you have with your health care provider. Document Released: 01/10/2000 Document Revised: 06/30/2017 Document Reviewed: 06/30/2017 Elsevier Interactive Patient Education  2019 [employer].

## 2018-03-11 ENCOUNTER — Telehealth: Payer: Self-pay

## 2018-03-11 NOTE — Telephone Encounter (Signed)
Patient states she still feel tired and she passed the Flu to her kids, but she is improving.

## 2018-03-16 ENCOUNTER — Telehealth: Payer: Self-pay

## 2018-05-21 NOTE — Telephone Encounter (Signed)
Phone call

## 2018-10-04 MED FILL — FLUCONAZOLE 150 MG TABLET: 150 | 2 days supply | Qty: 2 | Fill #0

## 2019-03-20 MED FILL — VALACYCLOVIR HCL 500 MG TAB: 500 | 10 days supply | Qty: 20 | Fill #0

## 2019-03-23 MED FILL — VIT D3-50 50,000 UNITS CAPS: 1.25 MG | 84 days supply | Qty: 12 | Fill #0

## 2019-05-17 MED FILL — CYCLOBENZAPRINE HCL 5 MG TA: 5 | 15 days supply | Qty: 30 | Fill #0

## 2019-05-17 MED FILL — METHYLPREDNISOLONE 4 MG TAB: 4 | 6 days supply | Qty: 21 | Fill #0

## 2019-07-20 MED FILL — metroNIDAZOLE 500 MG TABS: 500 | 7 days supply | Qty: 14 | Fill #0

## 2019-08-04 ENCOUNTER — Encounter: Payer: Self-pay | Admitting: Plastic Surgery

## 2019-08-04 ENCOUNTER — Ambulatory Visit (INDEPENDENT_AMBULATORY_CARE_PROVIDER_SITE_OTHER): Payer: No Typology Code available for payment source | Admitting: Plastic Surgery

## 2019-08-04 ENCOUNTER — Other Ambulatory Visit: Payer: Self-pay

## 2019-08-04 DIAGNOSIS — N62 Hypertrophy of breast: Secondary | ICD-10-CM | POA: Insufficient documentation

## 2019-08-04 DIAGNOSIS — M549 Dorsalgia, unspecified: Secondary | ICD-10-CM | POA: Insufficient documentation

## 2019-08-04 DIAGNOSIS — G8929 Other chronic pain: Secondary | ICD-10-CM | POA: Diagnosis not present

## 2019-08-04 DIAGNOSIS — M546 Pain in thoracic spine: Secondary | ICD-10-CM

## 2019-08-04 DIAGNOSIS — M542 Cervicalgia: Secondary | ICD-10-CM | POA: Diagnosis not present

## 2019-08-04 NOTE — Progress Notes (Signed)
Patient ID: [name], female    DOB: 09-30-1985, 34 y.o.   MRN: [id number]   Chief Complaint  Patient presents with  . Consult  . Breast Problem    Mammary Hyperplasia: The patient is a 34 y.o. female with a history of mammary hyperplasia for several years.  She has extremely large breasts causing symptoms that include the following: Back pain in the upper and lower back, including neck pain. She pulls or pins her bra straps to provide better lift and relief of the pressure and pain. She notices relief by holding her breast up manually.  Her shoulder straps cause grooves and pain and pressure that requires padding for relief. Pain medication is sometimes required with motrin and tylenol.  Activities that are hindered by enlarged breasts include: exercise and running.  Difficult time moving patients at work.  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  Her breasts are extremely large and fairly symmetric.  She has hyperpigmentation of the inframammary area on both sides.  The sternal to nipple distance on the right is 30 cm and the left is 30 cm.  The IMF distance is 14 cm.  She is 5 feet 2 inches tall and weighs 170 pounds.  Preoperative bra size = 38 DDD cup.  She would like to be around a C/D cup.  I did inform her that most likely it would be more of a C cup.  The estimated excess breast tissue to be removed at the time of surgery = 400 grams on the left and 400 grams on the right.  Mammogram history: 2017.  She had some scar tissue and no other concerns.  She does not have a family history of breast cancer.  She does not have diabetes and is not a smoker.  She has been to a chiropractor on and off in the past but it has been a couple of years since her last visit.   Review of Systems  Constitutional: Negative.   HENT: Negative.   Eyes: Negative.   Respiratory: Negative.   Cardiovascular: Negative for chest pain.  Gastrointestinal: Negative.  Negative for abdominal pain.  Endocrine:  Negative.   Genitourinary: Negative.   Musculoskeletal: Positive for back pain and neck pain. Negative for neck stiffness.  Skin: Positive for color change and rash. Negative for pallor.  Hematological: Negative.   Psychiatric/Behavioral: Negative.     Past Medical History:  Diagnosis Date  . Abnormal Pap smear   . Anemia   . BV (bacterial vaginosis)   . Cyst   . 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 Headache(784.0)    otc med prn  . History of blood transfusion 05/2011   at [facility]  . History of kidney stones    passed stone no surgery required  . History of renal stone   . HSV infection   . Lactating mother 07/04/2011  . Migraine   . Ovarian cyst   . Panic attack    no meds  . S/P laparoscopic assisted vaginal hysterectomy (LAVH) 05/12/2017  . Seasonal allergies     Past Surgical History:  Procedure Laterality Date  . ABDOMINAL HYSTERECTOMY    . CESAREAN SECTION  03/2009  . CESAREAN SECTION  06/24/2011   Procedure: CESAREAN SECTION;  Surgeon: Esmeralda Arthur, MD;  Location: [facility];  Service: Gynecology;  Laterality: N/A;  . LAPAROSCOPIC ASSISTED VAGINAL HYSTERECTOMY  05/12/2017   Procedure: LAPAROSCOPIC ASSISTED VAGINAL HYSTERECTOMY;  Surgeon: Sherian Rein, MD;  Location: [facility];  Service: Gynecology;;  . LAPAROSCOPIC LYSIS OF ADHESIONS  05/12/2017   Procedure: EXTENSIVE LAPAROSCOPIC LYSIS OF ADHESIONS;  Surgeon: Sherian Rein, MD;  Location: [facility];  Service: Gynecology;;  . LAPAROTOMY  06/25/2011   Procedure: EXPLORATORY LAPAROTOMY; Repair ileus  Surgeon: Purcell Nails, MD;  Location: [facility];  Service: Gynecology;  Laterality: N/A;   . WISDOM TOOTH EXTRACTION  2008, 02/2010      Current Outpatient Medications:  .  diphenhydrAMINE (BENADRYL) 25 MG tablet, Take 2 tablets (50 mg total) by mouth every 4 (four) hours. (Patient not taking: Reported on 10/17/2017), Disp: 60 tablet, Rfl: 0 .  fluticasone (FLONASE) 50 MCG/ACT nasal spray, Place 2 sprays into  both nostrils daily for 10 days., Disp: 16 g, Rfl: 0 .  ibuprofen (ADVIL,MOTRIN) 800 MG tablet, Take 800 mg by mouth every 8 (eight) hours as needed for moderate pain., Disp: , Rfl:  .  PE-Doxylamine-DM-GG-APAP (TYLENOL COLD & FLU DAY/NIGHT PO), Take by mouth., Disp: , Rfl:  .  predniSONE (DELTASONE) 20 MG tablet, 2 tabs po daily x 4 days (Patient not taking: Reported on 10/17/2017), Disp: 8 tablet, Rfl: 0 .  Pseudoeph-Doxylamine-DM-APAP (NYQUIL PO), Take by mouth., Disp: , Rfl:  .  zonisamide (ZONEGRAN) 25 MG capsule, Take 100 mg by mouth daily., Disp: , Rfl: 1   Objective:   Vitals:   08/04/19 0930  BP: 119/80  Pulse: 77  Temp: (!) 97.5 F (36.4 C)  SpO2: 98%    Physical Exam Vitals and nursing note reviewed.  Constitutional:      Appearance: Normal appearance.  HENT:     Head: Normocephalic and atraumatic.  Eyes:     Extraocular Movements: Extraocular movements intact.  Cardiovascular:     Rate and Rhythm: Normal rate.     Pulses: Normal pulses.  Pulmonary:     Effort: Pulmonary effort is normal.  Abdominal:     General: Abdomen is flat. There is no distension.     Tenderness: There is no abdominal tenderness.  Skin:    General: Skin is warm.     Capillary Refill: Capillary refill takes less than 2 seconds.  Neurological:     General: No focal deficit present.     Mental Status: She is alert and oriented to person, place, and time.  Psychiatric:        Mood and Affect: Mood normal.        Behavior: Behavior normal.        Thought Content: Thought content normal.     Assessment & Plan:  Chronic bilateral thoracic back pain  Neck pain  Symptomatic mammary hypertrophy  The patient is a good candidate for bilateral breast reduction.  Possible liposuction laterally.   I will send her for physical therapy.  We discussed the risks and complications which include difficulty healing, risk of needing wound dressings.  Also change in nipple areola sensation and an  inability to breast-feed in the future. Pictures were obtained of the patient and placed in the chart with the patient's or guardian's permission.   Alena Bills Carrissa Taitano, DO

## 2019-08-07 MED FILL — VALACYCLOVIR HCL 500 MG TAB: 500 | 10 days supply | Qty: 20 | Fill #1

## 2019-08-14 MED FILL — FLUCONAZOLE 150 MG TABS: 150 | 2 days supply | Qty: 2 | Fill #0

## 2019-08-21 ENCOUNTER — Ambulatory Visit: Payer: No Typology Code available for payment source | Attending: Plastic Surgery | Admitting: Physical Therapy

## 2019-08-21 ENCOUNTER — Other Ambulatory Visit: Payer: Self-pay

## 2019-08-21 DIAGNOSIS — M6281 Muscle weakness (generalized): Secondary | ICD-10-CM | POA: Diagnosis present

## 2019-08-21 DIAGNOSIS — G8929 Other chronic pain: Secondary | ICD-10-CM | POA: Insufficient documentation

## 2019-08-21 DIAGNOSIS — M5441 Lumbago with sciatica, right side: Secondary | ICD-10-CM | POA: Diagnosis present

## 2019-08-21 DIAGNOSIS — M542 Cervicalgia: Secondary | ICD-10-CM | POA: Diagnosis present

## 2019-08-21 DIAGNOSIS — M62830 Muscle spasm of back: Secondary | ICD-10-CM | POA: Insufficient documentation

## 2019-08-21 NOTE — Addendum Note (Signed)
Addended by: Alease Medina L on: 08/21/2019 06:16 PM   Modules accepted: Orders

## 2019-08-21 NOTE — Therapy (Signed)
[facility] [address] [place], [place], [address] Phone: [phone]   Fax:  [phone]  Physical Therapy Evaluation  Patient Details  Name: [name] MRN: [id number] Date of Birth: [birth year] Referring Provider (PT): Peggye Form, DO   Encounter Date: 08/21/2019   PT End of Session - 08/21/19 1603    Visit Number 1    Number of Visits 6    PT Start Time 1612    PT Stop Time 1700    PT Time Calculation (min) 48 min    Activity Tolerance Patient tolerated treatment well    Behavior During Therapy [facility] for tasks assessed/performed           Past Medical History:  Diagnosis Date  . Abnormal Pap smear   . Anemia   . BV (bacterial vaginosis)   . Cyst   . 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 Headache(784.0)    otc med prn  . History of blood transfusion 05/2011   at [facility]  . History of kidney stones    passed stone no surgery required  . History of renal stone   . HSV infection   . Lactating mother 07/04/2011  . Migraine   . Ovarian cyst   . Panic attack    no meds  . S/P laparoscopic assisted vaginal hysterectomy (LAVH) 05/12/2017  . Seasonal allergies     Past Surgical History:  Procedure Laterality Date  . ABDOMINAL HYSTERECTOMY    . CESAREAN SECTION  03/2009  . CESAREAN SECTION  06/24/2011   Procedure: CESAREAN SECTION;  Surgeon: Esmeralda Arthur, MD;  Location: [facility];  Service: Gynecology;  Laterality: N/A;  . LAPAROSCOPIC ASSISTED VAGINAL HYSTERECTOMY  05/12/2017   Procedure: LAPAROSCOPIC ASSISTED VAGINAL HYSTERECTOMY;  Surgeon: Sherian Rein, MD;  Location: [facility];  Service: Gynecology;;  . LAPAROSCOPIC LYSIS OF ADHESIONS  05/12/2017   Procedure: EXTENSIVE LAPAROSCOPIC LYSIS OF ADHESIONS;  Surgeon: Sherian Rein, MD;  Location: [facility];  Service: Gynecology;;  . LAPAROTOMY  06/25/2011   Procedure: EXPLORATORY LAPAROTOMY; Repair ileus  Surgeon: Purcell Nails, MD;   Location: [facility];  Service: Gynecology;  Laterality: N/A;   . WISDOM TOOTH EXTRACTION  2008, 02/2010    There were no vitals filed for this visit.    Subjective Assessment - 08/21/19 1612    Subjective Pt reports she has been trying to get insurance approval for her breast reduction surgery. Pt reports neck pain and low back pain. Reports pain has worsened after child birth (>5 years ago). Pt reports pain shoots down more into her R leg. Pt states she has tried a [employer] who provided e-stim and this did not have any affect on her back pain. She reports working out at [employer] with a trainer and has tried stretches for her low back with limited results.    Limitations Lifting;Standing;Walking;Sitting    How long can you sit comfortably? At least 30 min to 1 hr    How long can you stand comfortably? At least 1 hr    How long can you walk comfortably? At least 45 min to 1 hr    Currently in Pain? 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    Multiple Pain Sites Yes    Pain Score 7    Pain Location Neck    Pain Orientation Lower    Pain Type Chronic pain              [facility] PT Assessment - 08/21/19 0001      Assessment   Medical Diagnosis N62 (ICD-10-CM) - Symptomatic mammary hypertrophy    Referring Provider (PT) Alena Bills Dillingham, DO    Prior Therapy None      Balance Screen   Has the patient fallen in the past 6 months No      Home Environment   Living Environment Private residence    Type of Home Apartment    Home Access Stairs to enter    Entrance Stairs-Number of Steps 3 flights      Prior Function   Level of Independence Independent      Posture/Postural Control   Posture Comments Increased lumbar  lordosis and thoracic kyphosis, posteriorly tilted bilat inonminate      AROM   Cervical Flexion WFL    Cervical Extension WFL    Cervical - Right Side Bend WFL    Cervical - Left Side Bend WFL    Cervical - Right Rotation WFL    Cervical - Left Rotation WFL    Lumbar Flexion 25%    Lumbar Extension 10%    Lumbar - Right Side Bend WFL    Lumbar - Left Side Bend Pain increased but WFL    Lumbar - Right Rotation Pain increased but WFL    Lumbar - Left Rotation Pain increased but WFL      Strength   Overall Strength Within functional limits for tasks performed    Overall Strength Comments grossly 5/5 for bilat UEs and LEs    Right Hip Flexion 4+/5    Right Hip Extension 3+/5    Right Hip ABduction 3+/5    Left Hip Flexion 4+/5    Left Hip Extension 4/5    Left Hip ABduction 3+/5      Flexibility   Hamstrings 115 on L, 105 on R    Piriformis Pulls on low back      FABER test   findings Positive      Slump test   Findings Positive      Prone Knee Bend Test   Findings Positive    Comment bilat      Straight Leg Raise   Findings Positive    Comment bilat      Pelvic Dictraction   Findings Positive    Comment bilat      Pelvic Compression   Findings Positive      Gaenslen's test   Findings Positive    Side  Right                      Objective measurements completed on examination: See above findings.               PT Education - 08/21/19 1806    Education Details Discussed core strengthening for her back and pelvis. Discussed with pt that if she's tried stretching and foam rolling, PT will heavily focus on strengthening.    Person(s) Educated Patient    Methods Explanation;Demonstration;Handout;Verbal cues;Tactile cues    Comprehension Verbalized understanding;Returned demonstration;Verbal cues required;Tactile cues required;Need further instruction  PT Short Term Goals - 08/21/19 1807      PT SHORT TERM GOAL #1    Title Pt will be independent with her initial HEP    Time 3    Period Weeks    Status New    Target Date 09/11/19             PT Long Term Goals - 08/21/19 1808      PT LONG TERM GOAL #1   Title Pt will report decrease in LBP to be </=6/10 in general    Baseline 10/10 pain    Time 6    Period Weeks    Status New    Target Date 10/02/19      PT LONG TERM GOAL #2   Title Pt will have improved lumbar motion to </=50% limitation for improved mobility for bending tasks    Baseline Trunk flexes ~25%, extension ~10%    Time 6    Period Weeks    Status New    Target Date 10/02/19      PT LONG TERM GOAL #3   Title Pt will be independent with advanced HEP    Time 6    Period Weeks    Status New    Target Date 10/02/19      PT LONG TERM GOAL #4   Title Pt will report reduced neck pain to </=4/10 in general    Baseline 7/10 neck pain    Time 6    Period Weeks    Status New    Target Date 10/02/19                  Plan - 08/21/19 1757    Clinical Impression Statement Pt is a 34 y/o F presenting to clinic due to complaint of lumbosacral and lower cervical pain. Special testing demonstrates that pt likely has a mixed etiology of lumbar and SI pelvic pain. Pt presents with hypermobile lumbar spine with multiple muscle spasms along QL, paraspinals, and PSIS, decreased core strength and hip stability. Pt is tender to palpation and mobilization of lumbopelvic region with poor transversus abdominis contraction. PT is suspicious that pt has bilateral posteriorly rotated inominates but this may require further assessment. Pt would benefit from therapy to address these issues and maximize her level of function.    Personal Factors and Comorbidities Age;Comorbidity 1;Past/Current Experience;Profession;Time since onset of injury/illness/exacerbation    Comorbidities hysterectomy    Examination-Activity Limitations Bend;Carry;Sit;Squat;Stairs;Lift;Stand;Locomotion Level     Examination-Participation Restrictions Psychologist, forensic;Yard Work    [employer] Potential Fair    PT Frequency 1x / week    PT Duration 6 weeks    PT Treatment/Interventions Biofeedback;ADLs/Self Care Home Management;Electrical Stimulation;Iontophoresis 4mg /ml Dexamethasone;Moist Heat;Traction;Ultrasound;Gait training;Stair training;Functional mobility training;Therapeutic activities;Therapeutic exercise;Balance training;Neuromuscular re-education;Patient/family education;Manual techniques;Passive range of motion;Energy conservation;Dry needling;Taping;Spinal Manipulations;Joint Manipulations    PT Next Visit Plan Assess response to HEP. Continue to progress core strengthening and hip stabilization. Provide manual therapy if pt able to tolerate it. Consider METs if pelvis/SI appear off set.    PT Home Exercise Plan Access Code: [email or web address]    Consulted and Agree with Plan of Care Patient           Patient will benefit from skilled therapeutic intervention in order to improve the following deficits and impairments:  Decreased coordination, Increased fascial restricitons, Increased muscle spasms, Pain, Improper body mechanics, Decreased mobility, Decreased strength, Postural dysfunction  Visit Diagnosis: Chronic bilateral low back pain with right-sided sciatica  Cervicalgia  Muscle  weakness (generalized)  Muscle spasm of back     Problem List Patient Active Problem List   Diagnosis Date Noted  . Back pain 08/04/2019  . Neck pain 08/04/2019  . Symptomatic mammary hypertrophy 08/04/2019  . Postoperative fever 07/02/2017  . S/P laparoscopic assisted vaginal hysterectomy (LAVH) 05/12/2017  . 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 Hordeolum externum of right lower eyelid 05/22/2016  . Abscess of right axilla 12/01/2012  . Lactating mother 07/04/2011  . Postpartum hypertension 07/04/2011  . Postoperative ileus (HCC) 07/03/2011  . Post-op bleeding - s/p exploratory  laparotomy  06/27/2011  . Status post repeat low transverse cesarean section 06/24/2011  . Pregnant state, incidental 05/21/2011  . H/O cesarean section 05/21/2011  . Overweight(278.02) 05/21/2011  .  H/O Renal stones 11/06/2010  . Previous  C-section plans repeat  11/06/2010  . Migraines 06/06/2010    Bernadean Saling April Ma L Zaiah Credeur PT, DPT 08/21/2019, 6:14 PM  [facility] [address] [place], [place], [address] Phone: [phone]   Fax:  [phone]  Name: [name] MRN: [id number] Date of Birth: 11/14/1985

## 2019-08-21 NOTE — Patient Instructions (Signed)
Access Code: [email or web address] URL: [email or web address] Date: 08/21/2019 Prepared by: Vernon Prey April Kirstie Peri  Exercises Supine March with Posterior Pelvic Tilt - 1 x daily - 7 x weekly - 3 sets - 10 reps Supine Figure 4 Piriformis Stretch - 1 x daily - 7 x weekly - 3 sets - 30 sec hold Hooklying Clamshell with Resistance - 1 x daily - 7 x weekly - 3 sets - 10 reps Supine Bridge - 1 x daily - 7 x weekly - 3 sets - 10 reps Supine Hip Adduction Isometric with Ball - 1 x daily - 7 x weekly - 3 sets - 10 reps - 3 sec hold

## 2019-08-31 ENCOUNTER — Ambulatory Visit: Payer: No Typology Code available for payment source | Admitting: Physical Therapy

## 2019-09-05 ENCOUNTER — Ambulatory Visit: Payer: No Typology Code available for payment source | Attending: Plastic Surgery | Admitting: Physical Therapy

## 2019-09-05 ENCOUNTER — Other Ambulatory Visit: Payer: Self-pay

## 2019-09-05 DIAGNOSIS — G8929 Other chronic pain: Secondary | ICD-10-CM | POA: Diagnosis present

## 2019-09-05 DIAGNOSIS — M542 Cervicalgia: Secondary | ICD-10-CM | POA: Insufficient documentation

## 2019-09-05 DIAGNOSIS — M5441 Lumbago with sciatica, right side: Secondary | ICD-10-CM | POA: Insufficient documentation

## 2019-09-05 DIAGNOSIS — M6281 Muscle weakness (generalized): Secondary | ICD-10-CM | POA: Diagnosis present

## 2019-09-05 DIAGNOSIS — M62830 Muscle spasm of back: Secondary | ICD-10-CM | POA: Insufficient documentation

## 2019-09-05 NOTE — Therapy (Addendum)
[place], [place], [address] Phone: [phone]   Fax:  [phone]  Physical Therapy Treatment and Discharge  Patient Details  Name: [name] MRN: [id number] Date of Birth: 1985-10-11 Referring Provider (PT): Wallace Going, DO   Encounter Date: 09/05/2019   PT End of Session - 09/05/19 1556    Visit Number 2    Number of Visits 6    PT Start Time 1535    PT Stop Time 8325    PT Time Calculation (min) 43 min           Past Medical History:  Diagnosis Date  . Abnormal Pap smear   . Anemia   . BV (bacterial vaginosis)   . Cyst   . 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 Headache(784.0)    otc med prn  . History of blood transfusion 05/2011   at [facility]  . History of kidney stones    passed stone no surgery required  . History of renal stone   . HSV infection   . Lactating mother 07/04/2011  . Migraine   . Ovarian cyst   . Panic attack    no meds  . S/P laparoscopic assisted vaginal hysterectomy (LAVH) 05/12/2017  . Seasonal allergies     Past Surgical History:  Procedure Laterality Date  . ABDOMINAL HYSTERECTOMY    . CESAREAN SECTION  03/2009  . CESAREAN SECTION  06/24/2011   Procedure: CESAREAN SECTION;  Surgeon: Alwyn Pea, MD;  Location: [facility];  Service: Gynecology;  Laterality: N/A;  . LAPAROSCOPIC ASSISTED VAGINAL HYSTERECTOMY  05/12/2017   Procedure: LAPAROSCOPIC ASSISTED VAGINAL HYSTERECTOMY;  Surgeon: Janyth Contes, MD;  Location: [facility];  Service: Gynecology;;  . LAPAROSCOPIC LYSIS OF ADHESIONS  05/12/2017   Procedure: EXTENSIVE LAPAROSCOPIC LYSIS OF ADHESIONS;  Surgeon: Janyth Contes, MD;  Location: [facility];  Service: Gynecology;;  . LAPAROTOMY  06/25/2011   Procedure: EXPLORATORY LAPAROTOMY; Repair ileus  Surgeon: Delice Lesch, MD;  Location: [facility];  Service: Gynecology;  Laterality: N/A;   . WISDOM TOOTH EXTRACTION  2008, 02/2010     There were no vitals filed for this visit.   Subjective Assessment - 09/05/19 1539    Subjective Pt states that the stretches have been helping and that she does feel as if the bridges has been releasing some tension.    Limitations Lifting;Standing;Walking;Sitting    How long can you sit comfortably? At least 30 min to 1 hr    How long can you stand comfortably? At least 1 hr    How long can you walk comfortably? At least 45 min to 1 hr    Currently in Pain? Yes    Pain Score 6     Pain Onset More than a month ago    Pain Score 7    Pain Location Neck    Pain Orientation Lower                             [facility] Adult PT Treatment/Exercise - 09/05/19 0001      Neck Exercises: Seated   Neck Retraction 5 reps;5 secs    Other Seated Exercise scapular retraction x 10      Lumbar Exercises: Stretches   Single Knee to Chest Stretch Right;Left;30 seconds    Lower Trunk Rotation 5 reps;10 seconds    Figure 4 Stretch 30 seconds      Lumbar  Exercises: Standing   Other Standing Lumbar Exercises PPT in standing x 10      Lumbar Exercises: Supine   Pelvic Tilt 10 reps    Pelvic Tilt Limitations with TSA activation    Dead Bug 10 reps    Bridge 20 reps    Other Supine Lumbar Exercises double knee bend on Pball 2x 10                    PT Short Term Goals - 08/21/19 1807      PT SHORT TERM GOAL #1   Title Pt will be independent with her initial HEP    Time 3    Period Weeks    Status New    Target Date 09/11/19             PT Long Term Goals - 08/21/19 1808      PT LONG TERM GOAL #1   Title Pt will report decrease in LBP to be </=6/10 in general    Baseline 10/10 pain    Time 6    Period Weeks    Status New    Target Date 10/02/19      PT LONG TERM GOAL #2   Title Pt will have improved lumbar motion to </=50% limitation for improved mobility for bending tasks    Baseline Trunk flexes ~25%, extension ~10%    Time 6    Period Weeks     Status New    Target Date 10/02/19      PT LONG TERM GOAL #3   Title Pt will be independent with advanced HEP    Time 6    Period Weeks    Status New    Target Date 10/02/19      PT LONG TERM GOAL #4   Title Pt will report reduced neck pain to </=4/10 in general    Baseline 7/10 neck pain    Time 6    Period Weeks    Status New    Target Date 10/02/19                 Plan - 09/05/19 2231    Clinical Impression Statement Pt presents to clinic with some improvement in her lumbar spine. Treatment focused on continued core and hip strengthening, postural stabilization, and providing treatment for pt's neck pain. Pt with increased R cervical paraspinal, upper trap, and levator spasm. PT addressed this with gentle manual therapy and educated pt on using tennis ball for self trigger point release/massage. Initiated neck and scapular retraction. Pt with good tolerance to treatment.    Personal Factors and Comorbidities Age;Comorbidity 1;Past/Current Experience;Profession;Time since onset of injury/illness/exacerbation    Comorbidities hysterectomy    Examination-Activity Limitations Bend;Carry;Sit;Squat;Stairs;Lift;Stand;Locomotion Level    Examination-Participation Restrictions Art gallery manager;Yard Work    [employer] Fair    PT Frequency 1x / week    PT Duration 6 weeks    PT Treatment/Interventions Biofeedback;ADLs/Self Care Home Management;Electrical Stimulation;Iontophoresis 3m/ml Dexamethasone;Moist Heat;Traction;Ultrasound;Gait training;Stair training;Functional mobility training;Therapeutic activities;Therapeutic exercise;Balance training;Neuromuscular re-education;Patient/family education;Manual techniques;Passive range of motion;Energy conservation;Dry needling;Taping;Spinal Manipulations;Joint Manipulations    PT Next Visit Plan Assess response to HEP. Continue to progress core strengthening and hip stabilization. Continue neck and scapular  strengthening/postural stabilization. Provide manual therapy if pt able to tolerate it. Consider METs if pelvis/SI appear off set.    PT Home Exercise Plan Access Code: [email or web address]    [email or web address] Agree with Plan of Care Patient  PHYSICAL THERAPY DISCHARGE SUMMARY  Visits from Start of Care: 2  Current functional level related to goals / functional outcomes: See above   Remaining deficits: See above   Education / Equipment: See above  Plan:                                                    Patient goals were not met. Patient is being discharged due to not returning since the last visit.  ?????        Patient will benefit from skilled therapeutic intervention in order to improve the following deficits and impairments:  Decreased coordination, Increased fascial restricitons, Increased muscle spasms, Pain, Improper body mechanics, Decreased mobility, Decreased strength, Postural dysfunction  Visit Diagnosis: Chronic bilateral low back pain with right-sided sciatica  Cervicalgia  Muscle weakness (generalized)  Muscle spasm of back     Problem List Patient Active Problem List   Diagnosis Date Noted  . Back pain 08/04/2019  . Neck pain 08/04/2019  . Symptomatic mammary hypertrophy 08/04/2019  . Postoperative fever 07/02/2017  . S/P laparoscopic assisted vaginal hysterectomy (LAVH) 05/12/2017  . 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 Hordeolum externum of right lower eyelid 05/22/2016  . Abscess of right axilla 12/01/2012  . Lactating mother 07/04/2011  . Postpartum hypertension 07/04/2011  . Postoperative ileus ([facility]) 07/03/2011  . Post-op bleeding - s/p exploratory laparotomy  06/27/2011  . Status post repeat low transverse cesarean section 06/24/2011  . Pregnant state, incidental 05/21/2011  . H/O cesarean section 05/21/2011  . Overweight(278.02) 05/21/2011  .  H/O Renal stones 11/06/2010  . Previous  C-section plans repeat  11/06/2010  .  Migraines 06/06/2010    Ouida Abeyta April Ma L Maria Coin PT, DPT 09/05/2019, 10:36 PM  [facility] April Gordy Levan, PT, DPT 11/02/2019, 3:53 PM  [facility] [address] [place], [place], [address] Phone: [phone]   Fax:  [phone]  Name: [name] MRN: [id number] Date of Birth: 08/18/1985

## 2019-09-13 ENCOUNTER — Ambulatory Visit: Payer: No Typology Code available for payment source | Admitting: Physical Therapy

## 2019-09-13 MED FILL — metroNIDAZOLE 500 MG TABS: 500 | 7 days supply | Qty: 14 | Fill #0

## 2019-09-13 MED FILL — NITROFURANTOIN MONO-MCR 100: 100 | 7 days supply | Qty: 14 | Fill #0

## 2019-09-20 ENCOUNTER — Ambulatory Visit: Payer: No Typology Code available for payment source | Admitting: Physical Therapy

## 2019-09-27 ENCOUNTER — Ambulatory Visit: Payer: No Typology Code available for payment source | Attending: Plastic Surgery | Admitting: Physical Therapy

## 2019-11-14 ENCOUNTER — Encounter: Payer: No Typology Code available for payment source | Admitting: Surgical

## 2019-11-27 ENCOUNTER — Other Ambulatory Visit (HOSPITAL_COMMUNITY): Payer: No Typology Code available for payment source

## 2019-11-29 ENCOUNTER — Encounter (HOSPITAL_BASED_OUTPATIENT_CLINIC_OR_DEPARTMENT_OTHER): Payer: Self-pay

## 2019-11-29 ENCOUNTER — Ambulatory Visit (HOSPITAL_BASED_OUTPATIENT_CLINIC_OR_DEPARTMENT_OTHER): Admit: 2019-11-29 | Payer: No Typology Code available for payment source | Admitting: Plastic Surgery

## 2019-11-29 SURGERY — BREAST REDUCTION WITH LIPOSUCTION
Anesthesia: General | Site: Breast | Laterality: Bilateral

## 2019-12-05 ENCOUNTER — Encounter: Payer: No Typology Code available for payment source | Admitting: Plastic Surgery

## 2019-12-08 ENCOUNTER — Encounter: Payer: No Typology Code available for payment source | Admitting: Plastic Surgery

## 2019-12-19 ENCOUNTER — Encounter: Payer: No Typology Code available for payment source | Admitting: Surgical

## 2019-12-28 ENCOUNTER — Other Ambulatory Visit (HOSPITAL_COMMUNITY): Payer: Self-pay | Admitting: Obstetrics and Gynecology

## 2019-12-29 MED FILL — VALACYCLOVIR HCL 500 MG TAB: 500 | 10 days supply | Qty: 20 | Fill #0

## 2020-01-22 ENCOUNTER — Other Ambulatory Visit: Payer: Self-pay

## 2020-01-22 ENCOUNTER — Ambulatory Visit (HOSPITAL_COMMUNITY)
Admission: EM | Admit: 2020-01-22 | Discharge: 2020-01-22 | Disposition: A | Discharge status: Home / Self Care | Payer: No Typology Code available for payment source

## 2020-01-30 ENCOUNTER — Encounter: Payer: No Typology Code available for payment source | Admitting: Plastic Surgery

## 2020-02-02 DIAGNOSIS — Z03818 Encounter for observation for suspected exposure to other biological agents ruled out: Secondary | ICD-10-CM | POA: Diagnosis not present

## 2020-02-19 ENCOUNTER — Other Ambulatory Visit (HOSPITAL_COMMUNITY): Payer: Self-pay | Admitting: Obstetrics and Gynecology

## 2020-02-19 DIAGNOSIS — Z1389 Encounter for screening for other disorder: Secondary | ICD-10-CM | POA: Diagnosis not present

## 2020-02-19 DIAGNOSIS — Z113 Encounter for screening for infections with a predominantly sexual mode of transmission: Secondary | ICD-10-CM | POA: Diagnosis not present

## 2020-02-19 DIAGNOSIS — Z01419 Encounter for gynecological examination (general) (routine) without abnormal findings: Secondary | ICD-10-CM | POA: Diagnosis not present

## 2020-02-19 DIAGNOSIS — Z6829 Body mass index (BMI) 29.0-29.9, adult: Secondary | ICD-10-CM | POA: Diagnosis not present

## 2020-02-19 DIAGNOSIS — N760 Acute vaginitis: Secondary | ICD-10-CM | POA: Diagnosis not present

## 2020-02-19 DIAGNOSIS — R319 Hematuria, unspecified: Secondary | ICD-10-CM | POA: Diagnosis not present

## 2020-02-19 DIAGNOSIS — R102 Pelvic and perineal pain: Secondary | ICD-10-CM | POA: Diagnosis not present

## 2020-02-19 DIAGNOSIS — Z130 Encounter for screening for diseases of the blood and blood-forming organs and certain disorders involving the immune mechanism: Secondary | ICD-10-CM | POA: Diagnosis not present

## 2020-02-19 DIAGNOSIS — N898 Other specified noninflammatory disorders of vagina: Secondary | ICD-10-CM | POA: Diagnosis not present

## 2020-02-19 DIAGNOSIS — Z202 Contact with and (suspected) exposure to infections with a predominantly sexual mode of transmission: Secondary | ICD-10-CM | POA: Diagnosis not present

## 2020-02-19 MED FILL — metroNIDAZOLE 500 MG TABS: 500 | 7 days supply | Qty: 14 | Fill #0

## 2020-02-28 DIAGNOSIS — R102 Pelvic and perineal pain: Secondary | ICD-10-CM | POA: Diagnosis not present

## 2020-03-13 ENCOUNTER — Other Ambulatory Visit (HOSPITAL_COMMUNITY): Payer: Self-pay | Admitting: Endodontics

## 2020-03-13 MED FILL — IBUPROFEN 600 MG TABLET: 600 | 2 days supply | Qty: 10 | Fill #0

## 2020-03-13 MED FILL — AMOXICILLIN 500 MG CAPSULE: 500 | 7 days supply | Qty: 28 | Fill #0

## 2020-05-09 ENCOUNTER — Ambulatory Visit (INDEPENDENT_AMBULATORY_CARE_PROVIDER_SITE_OTHER): Payer: 59 | Admitting: Plastic Surgery

## 2020-05-09 ENCOUNTER — Other Ambulatory Visit: Payer: Self-pay

## 2020-05-09 ENCOUNTER — Encounter: Payer: Self-pay | Admitting: Plastic Surgery

## 2020-05-09 VITALS — BP 115/80 | HR 65 | Ht 62.0 in | Wt 159.8 lb

## 2020-05-09 DIAGNOSIS — N62 Hypertrophy of breast: Secondary | ICD-10-CM | POA: Diagnosis not present

## 2020-05-09 DIAGNOSIS — M546 Pain in thoracic spine: Secondary | ICD-10-CM

## 2020-05-09 DIAGNOSIS — M4004 Postural kyphosis, thoracic region: Secondary | ICD-10-CM | POA: Diagnosis not present

## 2020-05-09 DIAGNOSIS — M545 Low back pain, unspecified: Secondary | ICD-10-CM | POA: Diagnosis not present

## 2020-05-09 NOTE — Progress Notes (Signed)
Referring Provider Irena Reichmann, DO [address] [place],  [place] [address]   CC:  Chief Complaint  Patient presents with  . Advice Only      [name] is an 35 y.o. female.  HPI: Patient presents to discuss breast reduction.  She had years of back pain, neck pain and shoulder grooving related to her large breasts.  She tried over-the-counter medications, warm packs, cold packs and supportive bras with little relief.  She also gets rashes beneath her breasts have been refractory to over-the-counter treatments.  She is currently triple D and wants to be around to see.  There is no family history of breast cancer.  She has had breast biopsies in the past that were just benign scar tissue.  This was done on the left side.  She does not smoke and is not a diabetic.  She has been to physical therapy before and completed this back in October.  Allergies  Allergen Reactions  . Azo [Phenazopyridine] Shortness Of Breath, Swelling and Rash    Lips swell  . Sulfamethoxazole Hives and Itching    Outpatient Encounter Medications as of 05/09/2020  Medication Sig  . amoxicillin (AMOXIL) 500 MG capsule TAKE 1 CAPSULE BY MOUTH 4 TIMES DAILY.  . diphenhydrAMINE (BENADRYL) 25 MG tablet Take 2 tablets (50 mg total) by mouth every 4 (four) hours. (Patient not taking: Reported on 10/17/2017)  . fluticasone (FLONASE) 50 MCG/ACT nasal spray Place 2 sprays into both nostrils daily for 10 days.  Marland Kitchen ibuprofen (ADVIL) 600 MG tablet TAKE 1 TABLET BY MOUTH EVERY 4 TO 6 HOURS.  Marland Kitchen ibuprofen (ADVIL,MOTRIN) 800 MG tablet Take 800 mg by mouth every 8 (eight) hours as needed for moderate pain.  . metroNIDAZOLE (FLAGYL) 500 MG tablet TAKE 1 TABLET EVERY 12 HOURS BY MOUTH FOR 7 DAYS  . PE-Doxylamine-DM-GG-APAP (TYLENOL COLD & FLU DAY/NIGHT PO) Take by mouth.  . predniSONE (DELTASONE) 20 MG tablet 2 tabs po daily x 4 days (Patient not taking: Reported on 10/17/2017)  . Pseudoeph-Doxylamine-DM-APAP  (NYQUIL PO) Take by mouth.  . valACYclovir (VALTREX) 500 MG tablet TAKE 1 TABLET BY MOUTH 2 TIMES DAILY FOR 3 DAYS  . zonisamide (ZONEGRAN) 25 MG capsule Take 100 mg by mouth daily.   No facility-administered encounter medications on file as of 05/09/2020.     Past Medical History:  Diagnosis Date  . Abnormal Pap smear   . Anemia   . BV (bacterial vaginosis)   . Cyst   . 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 Headache(784.0)    otc med prn  . History of blood transfusion 05/2011   at [facility]  . History of kidney stones    passed stone no surgery required  . History of renal stone   . HSV infection   . Lactating mother 07/04/2011  . Migraine   . Ovarian cyst   . Panic attack    no meds  . S/P laparoscopic assisted vaginal hysterectomy (LAVH) 05/12/2017  . Seasonal allergies     Past Surgical History:  Procedure Laterality Date  . ABDOMINAL HYSTERECTOMY    . CESAREAN SECTION  03/2009  . CESAREAN SECTION  06/24/2011   Procedure: CESAREAN SECTION;  Surgeon: Esmeralda Arthur, MD;  Location: [facility];  Service: Gynecology;  Laterality: N/A;  . LAPAROSCOPIC ASSISTED VAGINAL HYSTERECTOMY  05/12/2017   Procedure: LAPAROSCOPIC ASSISTED VAGINAL HYSTERECTOMY;  Surgeon: Sherian Rein, MD;  Location: [facility];  Service: Gynecology;;  . LAPAROSCOPIC LYSIS OF  ADHESIONS  05/12/2017   Procedure: EXTENSIVE LAPAROSCOPIC LYSIS OF ADHESIONS;  Surgeon: Sherian Rein, MD;  Location: [facility];  Service: Gynecology;;  . LAPAROTOMY  06/25/2011   Procedure: EXPLORATORY LAPAROTOMY; Repair ileus  Surgeon: Purcell Nails, MD;  Location: [facility];  Service: Gynecology;  Laterality: N/A;   . WISDOM TOOTH EXTRACTION  2008, 02/2010    Family History  Problem Relation Age of Onset  . Hypertension Mother   . Thrombophlebitis Mother   . Anemia Mother   . Heart disease Father   . Hypertension Father   . Diabetes Father   . Kidney disease Father   . Sickle cell trait Sister   . Asperger's syndrome  Brother   . Lupus Maternal Aunt   . Lupus Paternal Aunt   . Heart attack Maternal Grandmother   . Thrombophlebitis Maternal Grandmother   . Transient ischemic attack Maternal Grandmother   . Heart disease Maternal Grandmother        MI  . Alzheimer's disease Maternal Grandfather   . Bipolar disorder Maternal Grandfather   . Heart murmur Maternal Grandfather   . Mental illness Maternal Grandfather   . Hypotension Neg Hx   . Anesthesia problems Neg Hx   . Malignant hyperthermia Neg Hx   . Pseudochol deficiency Neg Hx     Social History   Social History Narrative  . Not on file     Review of Systems General: Denies fevers, chills, weight loss CV: Denies chest pain, shortness of breath, palpitations  Physical Exam Vitals with BMI 05/09/2020 08/04/2019 03/09/2018  Height 5\' 2"  5\' 2"  -  Weight 159 lbs 13 oz 170 lbs 3 oz 173 lbs 5 oz  BMI 29.22 31.12 -  Systolic 115 119  Diastolic 80 80 75  Pulse 65 77 92  Some encounter information is confidential and restricted. Go to Review Flowsheets activity to see all data.    General:  No acute distress,  Alert and oriented, Non-Toxic, Normal speech and affect Breast: She has grade 3 ptosis.  Sternal notch to nipple is 31 cm bilaterally.  Nipple to fold is 12 cm bilaterally.  I do not see any obvious scars or masses.  Assessment/Plan The patient has bilateral symptomatic macromastia.  She is a good candidate for a breast reduction.  She is interested in pursuing surgical treatment.  She has tried supportive garments and fitted bras with no relief.  The details of breast reduction surgery were discussed.  I explained the procedure in detail along the with the expected scars.  The risks were discussed in detail and include bleeding, infection, damage to surrounding structures, need for additional procedures, nipple loss, change in nipple sensation, persistent pain, contour irregularities and asymmetries.  I explained that breast feeding is  often not possible after breast reduction surgery.  We discussed the expected postoperative course with an overall recovery period of about 1 month.  She demonstrated full understanding of all risks.  We discussed her personal risk factors.  I anticipate approximately 420g of tissue removed from each side.   05/09/2020, 10:51 AM

## 2020-05-09 NOTE — Addendum Note (Signed)
Addended by: Verdie Shire on: 05/09/2020 11:29 AM   Modules accepted: Orders

## 2020-06-13 ENCOUNTER — Ambulatory Visit: Payer: 59 | Admitting: Plastic Surgery

## 2020-06-26 ENCOUNTER — Other Ambulatory Visit (HOSPITAL_COMMUNITY): Payer: Self-pay

## 2020-06-26 MED ORDER — NYSTATIN 100000 UNIT/GM EX POWD
1.0000 "application " | Freq: Two times a day (BID) | CUTANEOUS | 5 refills | Status: DC
  Filled 2020-06-26 – 2020-08-09 (×2): qty 15, 8d supply, fill #0

## 2020-06-26 MED ORDER — CLOTRIMAZOLE-BETAMETHASONE 1-0.05 % EX CREA
1.0000 "application " | TOPICAL_CREAM | Freq: Two times a day (BID) | CUTANEOUS | 1 refills | Status: DC
  Filled 2020-06-26 – 2020-08-09 (×2): qty 60, 30d supply, fill #0

## 2020-07-01 ENCOUNTER — Other Ambulatory Visit (HOSPITAL_COMMUNITY): Payer: Self-pay

## 2020-07-03 ENCOUNTER — Other Ambulatory Visit: Payer: Self-pay

## 2020-07-03 ENCOUNTER — Ambulatory Visit (INDEPENDENT_AMBULATORY_CARE_PROVIDER_SITE_OTHER): Payer: 59 | Admitting: Plastic Surgery

## 2020-07-03 DIAGNOSIS — R1033 Periumbilical pain: Secondary | ICD-10-CM | POA: Diagnosis not present

## 2020-07-03 NOTE — Progress Notes (Signed)
Referring Provider Irena Reichmann, DO [address] [place],  [place] [address]   CC:  Chief Complaint  Patient presents with  . Follow-up      [name] is an 35 y.o. female.  HPI: Patient presents to discuss her abdomen.  She is bothered by overhanging abdominal skin has been present since after her pregnancies.  She gets intermittent skin irritation beneath the skin that is worse in the summertime.  This has been refractory to nystatin powder.  Only prior abdominal operation is a hysterectomy performed in 2019.  She does not smoke and is not a diabetic.  I have seen her previously for breast reduction and she is interested in doing this at the same time as her breast reduction procedure.  Allergies  Allergen Reactions  . Azo [Phenazopyridine] Shortness Of Breath, Swelling and Rash    Lips swell  . Sulfamethoxazole Hives and Itching    Outpatient Encounter Medications as of 07/03/2020  Medication Sig  . clotrimazole-betamethasone (LOTRISONE) cream Apply 1 application topically 2 times daily  . nystatin (MYCOSTATIN/NYSTOP) powder Apply 1 application topically 2 times daily   No facility-administered encounter medications on file as of 07/03/2020.     Past Medical History:  Diagnosis Date  . Abnormal Pap smear   . Anemia   . BV (bacterial vaginosis)   . Cyst   . 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 Headache(784.0)    otc med prn  . History of blood transfusion 05/2011   at [facility]  . History of kidney stones    passed stone no surgery required  . History of renal stone   . HSV infection   . Lactating mother 07/04/2011  . Migraine   . Ovarian cyst   . Panic attack    no meds  . S/P laparoscopic assisted vaginal hysterectomy (LAVH) 05/12/2017  . Seasonal allergies     Past Surgical History:  Procedure Laterality Date  . ABDOMINAL HYSTERECTOMY    . CESAREAN SECTION  03/2009  . CESAREAN SECTION  06/24/2011   Procedure: CESAREAN SECTION;   Surgeon: Esmeralda Arthur, MD;  Location: [facility];  Service: Gynecology;  Laterality: N/A;  . LAPAROSCOPIC ASSISTED VAGINAL HYSTERECTOMY  05/12/2017   Procedure: LAPAROSCOPIC ASSISTED VAGINAL HYSTERECTOMY;  Surgeon: Sherian Rein, MD;  Location: [facility];  Service: Gynecology;;  . LAPAROSCOPIC LYSIS OF ADHESIONS  05/12/2017   Procedure: EXTENSIVE LAPAROSCOPIC LYSIS OF ADHESIONS;  Surgeon: Sherian Rein, MD;  Location: [facility];  Service: Gynecology;;  . LAPAROTOMY  06/25/2011   Procedure: EXPLORATORY LAPAROTOMY; Repair ileus  Surgeon: Purcell Nails, MD;  Location: [facility];  Service: Gynecology;  Laterality: N/A;   . WISDOM TOOTH EXTRACTION  2008, 02/2010    Family History  Problem Relation Age of Onset  . Hypertension Mother   . Thrombophlebitis Mother   . Anemia Mother   . Heart disease Father   . Hypertension Father   . Diabetes Father   . Kidney disease Father   . Sickle cell trait Sister   . Asperger's syndrome Brother   . Lupus Maternal Aunt   . Lupus Paternal Aunt   . Heart attack Maternal Grandmother   . Thrombophlebitis Maternal Grandmother   . Transient ischemic attack Maternal Grandmother   . Heart disease Maternal Grandmother        MI  . Alzheimer's disease Maternal Grandfather   . Bipolar disorder Maternal Grandfather   . Heart murmur Maternal Grandfather   . Mental illness  Maternal Grandfather   . Hypotension Neg Hx   . Anesthesia problems Neg Hx   . Malignant hyperthermia Neg Hx   . Pseudochol deficiency Neg Hx     Social History   Social History Narrative  . Not on file     Review of Systems General: Denies fevers, chills, weight loss CV: Denies chest pain, shortness of breath, palpitations  Physical Exam Vitals with BMI 05/09/2020 08/04/2019 03/09/2018  Height 5\' 2"  5\' 2"  -  Weight 159 lbs 13 oz 170 lbs 3 oz 173 lbs 5 oz  BMI 29.22 31.12 -  Systolic 115 119  Diastolic 80 80 75  Pulse 65 77 92  Some encounter information is confidential  and restricted. Go to Review Flowsheets activity to see all data.    General:  No acute distress,  Alert and oriented, Non-Toxic, Normal speech and affect Abdomen: Abdomen is soft nontender.  She has some periumbilical scarring.  There is some tenderness around the umbilicus and it is possible that there is a hernia defect in this location.  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  She does have an overhanging pannus with moderate to severe skin laxity.  Assessment/Plan Patient is a good Candidate for infraumbilical panniculectomy combined with upper abdominal liposuction and plication.  I will plan to send her for a CT scan just to make sure that there is not a significant hernia there given her prior abdominal operations.  We discussed the details of the panniculectomy procedure and the risk that include bleeding, infection, damage to surrounding structures and need for additional procedures.  We discussed the location and the orientation of the scars.  We discussed the expected postoperative process.  We discussed the need for drains.  We discussed that this would add to her recovery time after breast reduction alone but ultimately I think it safe to perform both these operations at the same time.  All her questions were answered we will plan to move forward.  07/03/2020, 5:02 PM

## 2020-07-04 ENCOUNTER — Other Ambulatory Visit (HOSPITAL_COMMUNITY): Payer: Self-pay

## 2020-07-19 ENCOUNTER — Ambulatory Visit (HOSPITAL_COMMUNITY): Payer: 59

## 2020-07-22 ENCOUNTER — Other Ambulatory Visit: Payer: Self-pay

## 2020-07-22 ENCOUNTER — Ambulatory Visit (HOSPITAL_COMMUNITY)
Admission: RE | Admit: 2020-07-22 | Discharge: 2020-07-22 | Disposition: A | Discharge status: Home / Self Care | Payer: 59 | Source: Ambulatory Visit | Attending: Plastic Surgery | Admitting: Plastic Surgery

## 2020-07-22 ENCOUNTER — Ambulatory Visit (HOSPITAL_COMMUNITY): Admission: RE | Admit: 2020-07-22 | Payer: 59 | Source: Ambulatory Visit | Admitting: Plastic Surgery

## 2020-07-22 DIAGNOSIS — R109 Unspecified abdominal pain: Secondary | ICD-10-CM | POA: Diagnosis not present

## 2020-07-22 DIAGNOSIS — R1033 Periumbilical pain: Secondary | ICD-10-CM | POA: Insufficient documentation

## 2020-07-22 DIAGNOSIS — M461 Sacroiliitis, not elsewhere classified: Secondary | ICD-10-CM | POA: Diagnosis not present

## 2020-07-22 DIAGNOSIS — N83292 Other ovarian cyst, left side: Secondary | ICD-10-CM | POA: Diagnosis not present

## 2020-07-22 DIAGNOSIS — N838 Other noninflammatory disorders of ovary, fallopian tube and broad ligament: Secondary | ICD-10-CM | POA: Diagnosis not present

## 2020-07-23 ENCOUNTER — Telehealth: Payer: Self-pay

## 2020-07-23 NOTE — Telephone Encounter (Signed)
Patient said she would like for Dr. Arita Miss to explain the results of her CT.  Please call.

## 2020-07-24 ENCOUNTER — Telehealth: Payer: Self-pay

## 2020-07-24 NOTE — Telephone Encounter (Signed)
Call returned to pt to discuss her CT Scan findings per report: cysts present in left ovary- they suggest an pelvic ultrasound- per Dr. Arita Miss referral was made by our office staff to schedule this. Patient did want to discuss the other findings- such as  Pelvic wall diastasis & sacroiliitis I informed her that the diastasis is a separation & sacroiliitis is inflammation of the sacroiliac joints I informed her that the ultrasound will allow Radiology to visualize all of the above. She does report that she has continued to have intermittent abdominal pain since her partial lap hysterectomy in 2019. Patient understands and agrees with the above information. I informed her that Dr. Arita Miss will receive the results of the ultrasound for review when completed. She does have an appointment with Susy Frizzle, [place] in July for the planned breast reduction procedure with Dr. Arita Miss in August.

## 2020-08-08 ENCOUNTER — Other Ambulatory Visit (HOSPITAL_COMMUNITY): Payer: Self-pay

## 2020-08-08 MED ORDER — BORIC ACID VAGINAL 600 MG VA SUPP: 600.0000 mg | Freq: Every day | VAGINAL | 1 refills | Status: AC

## 2020-08-09 ENCOUNTER — Other Ambulatory Visit (HOSPITAL_COMMUNITY): Payer: Self-pay

## 2020-08-09 DIAGNOSIS — N898 Other specified noninflammatory disorders of vagina: Secondary | ICD-10-CM | POA: Diagnosis not present

## 2020-08-09 DIAGNOSIS — N83202 Unspecified ovarian cyst, left side: Secondary | ICD-10-CM | POA: Diagnosis not present

## 2020-08-09 DIAGNOSIS — N7011 Chronic salpingitis: Secondary | ICD-10-CM | POA: Diagnosis not present

## 2020-08-09 DIAGNOSIS — R102 Pelvic and perineal pain: Secondary | ICD-10-CM | POA: Diagnosis not present

## 2020-08-09 MED ORDER — METRONIDAZOLE 500 MG PO TABS
500.0000 mg | ORAL_TABLET | Freq: Two times a day (BID) | ORAL | 0 refills | Status: AC
  Filled 2020-08-09: qty 18, 9d supply, fill #0

## 2020-08-09 MED ORDER — DOXYCYCLINE HYCLATE 100 MG PO CAPS
100.0000 mg | ORAL_CAPSULE | Freq: Two times a day (BID) | ORAL | 0 refills | Status: AC
  Filled 2020-08-09: qty 20, 10d supply, fill #0

## 2020-08-13 ENCOUNTER — Encounter: Payer: 59 | Admitting: Surgical

## 2020-09-06 ENCOUNTER — Encounter: Payer: 59 | Admitting: Surgical

## 2020-09-09 ENCOUNTER — Other Ambulatory Visit (HOSPITAL_COMMUNITY): Payer: Self-pay

## 2020-09-09 MED ORDER — VALACYCLOVIR HCL 500 MG PO TABS
500.0000 mg | ORAL_TABLET | Freq: Two times a day (BID) | ORAL | 1 refills | Status: DC
  Filled 2020-09-09: qty 20, 10d supply, fill #0

## 2020-09-12 ENCOUNTER — Encounter: Payer: 59 | Admitting: Plastic Surgery

## 2020-09-17 ENCOUNTER — Other Ambulatory Visit (HOSPITAL_COMMUNITY): Payer: Self-pay

## 2020-09-17 DIAGNOSIS — M62838 Other muscle spasm: Secondary | ICD-10-CM | POA: Diagnosis not present

## 2020-09-17 MED ORDER — CYCLOBENZAPRINE HCL 5 MG PO TABS
5.0000 mg | ORAL_TABLET | Freq: Two times a day (BID) | ORAL | 1 refills | Status: DC | PRN
  Filled 2020-09-17: qty 30, 10d supply, fill #0

## 2020-09-17 MED ORDER — PREDNISONE 10 MG PO TABS
ORAL_TABLET | ORAL | 0 refills | Status: DC
  Filled 2020-09-17: qty 10, 7d supply, fill #0

## 2020-09-19 ENCOUNTER — Other Ambulatory Visit (HOSPITAL_COMMUNITY): Payer: Self-pay

## 2020-09-19 MED ORDER — BACLOFEN 20 MG PO TABS
20.0000 mg | ORAL_TABLET | Freq: Three times a day (TID) | ORAL | 0 refills | Status: DC | PRN
  Filled 2020-09-19: qty 30, 10d supply, fill #0

## 2020-09-20 ENCOUNTER — Other Ambulatory Visit (HOSPITAL_COMMUNITY): Payer: Self-pay

## 2020-09-20 MED ORDER — QUICKVUE AT-HOME COVID-19 TEST VI KIT
PACK | 0 refills | Status: AC
  Filled 2020-09-20: qty 4, 4d supply, fill #0
  Filled 2020-10-07: qty 2, 2d supply, fill #0
  Filled 2020-10-07: qty 4, 4d supply, fill #0

## 2020-09-23 DIAGNOSIS — N83291 Other ovarian cyst, right side: Secondary | ICD-10-CM | POA: Diagnosis not present

## 2020-10-02 ENCOUNTER — Other Ambulatory Visit (HOSPITAL_COMMUNITY): Payer: Self-pay

## 2020-10-07 ENCOUNTER — Other Ambulatory Visit (HOSPITAL_COMMUNITY): Payer: Self-pay

## 2020-10-10 ENCOUNTER — Encounter: Payer: 59 | Admitting: Surgical

## 2020-10-16 ENCOUNTER — Encounter: Payer: 59 | Admitting: Surgical

## 2020-10-29 ENCOUNTER — Ambulatory Visit (HOSPITAL_BASED_OUTPATIENT_CLINIC_OR_DEPARTMENT_OTHER): Admit: 2020-10-29 | Payer: 59 | Admitting: Plastic Surgery

## 2020-10-29 ENCOUNTER — Encounter (HOSPITAL_BASED_OUTPATIENT_CLINIC_OR_DEPARTMENT_OTHER): Payer: Self-pay

## 2020-10-29 SURGERY — MAMMOPLASTY, REDUCTION
Anesthesia: General | Site: Breast | Laterality: Bilateral

## 2020-11-06 ENCOUNTER — Encounter: Payer: 59 | Admitting: Plastic Surgery

## 2020-11-14 ENCOUNTER — Encounter: Payer: 59 | Admitting: Surgical

## 2021-01-15 DIAGNOSIS — F4323 Adjustment disorder with mixed anxiety and depressed mood: Secondary | ICD-10-CM | POA: Diagnosis not present

## 2021-01-24 DIAGNOSIS — F4323 Adjustment disorder with mixed anxiety and depressed mood: Secondary | ICD-10-CM | POA: Diagnosis not present

## 2021-01-31 DIAGNOSIS — F4323 Adjustment disorder with mixed anxiety and depressed mood: Secondary | ICD-10-CM | POA: Diagnosis not present

## 2021-02-07 DIAGNOSIS — F4323 Adjustment disorder with mixed anxiety and depressed mood: Secondary | ICD-10-CM | POA: Diagnosis not present

## 2021-02-14 DIAGNOSIS — F4323 Adjustment disorder with mixed anxiety and depressed mood: Secondary | ICD-10-CM | POA: Diagnosis not present

## 2021-02-20 DIAGNOSIS — Z6830 Body mass index (BMI) 30.0-30.9, adult: Secondary | ICD-10-CM | POA: Diagnosis not present

## 2021-02-20 DIAGNOSIS — Z1389 Encounter for screening for other disorder: Secondary | ICD-10-CM | POA: Diagnosis not present

## 2021-02-20 DIAGNOSIS — Z113 Encounter for screening for infections with a predominantly sexual mode of transmission: Secondary | ICD-10-CM | POA: Diagnosis not present

## 2021-02-20 DIAGNOSIS — Z202 Contact with and (suspected) exposure to infections with a predominantly sexual mode of transmission: Secondary | ICD-10-CM | POA: Diagnosis not present

## 2021-02-20 DIAGNOSIS — E559 Vitamin D deficiency, unspecified: Secondary | ICD-10-CM | POA: Diagnosis not present

## 2021-02-20 DIAGNOSIS — E049 Nontoxic goiter, unspecified: Secondary | ICD-10-CM | POA: Diagnosis not present

## 2021-02-20 DIAGNOSIS — Z130 Encounter for screening for diseases of the blood and blood-forming organs and certain disorders involving the immune mechanism: Secondary | ICD-10-CM | POA: Diagnosis not present

## 2021-02-20 DIAGNOSIS — Z01419 Encounter for gynecological examination (general) (routine) without abnormal findings: Secondary | ICD-10-CM | POA: Diagnosis not present

## 2021-02-21 ENCOUNTER — Other Ambulatory Visit (HOSPITAL_COMMUNITY): Payer: Self-pay

## 2021-02-21 DIAGNOSIS — F4323 Adjustment disorder with mixed anxiety and depressed mood: Secondary | ICD-10-CM | POA: Diagnosis not present

## 2021-02-21 MED ORDER — CHOLECALCIFEROL 1.25 MG (50000 UT) PO CAPS
50000.0000 [IU] | ORAL_CAPSULE | ORAL | 1 refills | Status: AC
  Filled 2021-02-21: qty 12, 84d supply, fill #0
  Filled 2021-07-07: qty 12, 84d supply, fill #1

## 2021-02-24 NOTE — Progress Notes (Signed)
Patient ID: [name], female    DOB: [birth year], 36 y.o.   MRN: [id number]  Chief Complaint  Patient presents with   Pre-op Exam      ICD-10-CM   1. Macromastia  N62        History of Present Illness: [name] is a 36 y.o.  female  with a history of macromastia.  She presents for preoperative evaluation for upcoming procedure, bilateral breast reduction, scheduled for 03/18/2021 with Dr. Claudia Desanctis.  The patient has not had problems with anesthesia.  She is a non-smoker.  Denies personal or family history of blood clots or clotting disorder.  No recent hospitalizations or traumas.  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  She will hold her vitamin D supplement 1 week prior to surgery.  Sulfa allergy, but no other known drug allergies.  Denies any history of keloiding or hypertrophic scarring.  Summary of Previous Visit: Patient was seen for initial consult for her macromastia 05/09/2020.  She complained of chronic back and neck discomfort as well as intermittent inframammary rashes.  She reported that she was a DDD and would like to be a C cup.  Denies any family history of breast cancer.  She personally had previous breast biopsies in the past that were benign scar tissue.  STN 31 cm bilaterally.  Job: ICU nurse tech.  Discussed between 4 to 6 weeks FMLA.  PMH Significant for: Symptomatic mammary hypertrophy, H/O cesarean section, laparoscopic hysterectomy, postoperative ileus.   Past Medical History: Allergies: Allergies  Allergen Reactions   Azo [Phenazopyridine] Shortness Of Breath, Swelling and Rash    Lips swell   Sulfamethoxazole Hives and Itching    Current Medications:  Current Outpatient Medications:    Cholecalciferol 1.25 MG (50000 UT) capsule, Take 1 capsule (50,000 Units total) by mouth every 7 (seven) days., Disp: 12 capsule, Rfl: 1   baclofen (LIORESAL) 20 MG tablet, Take 1 tablet (20 mg total) by mouth every 8  (eight) hours as needed for 10 days, Disp: 30 tablet, Rfl: 0   Boric Acid Vaginal 600 MG SUPP, Place 1 capsule  vaginally at bedtime for 10 days, then 2 to 3 times weekly for 3 months (Patient not taking: Reported on 02/26/2021), Disp: 20 suppository, Rfl: 1   clotrimazole-betamethasone (LOTRISONE) cream, Apply 1 application topically 2 times daily, Disp: 60 g, Rfl: 1   COVID-19 At Home Antigen Test (QUICKVUE AT-HOME COVID-19 TEST) KIT, Use as directed., Disp: 4 each, Rfl: 0   cyclobenzaprine (FLEXERIL) 5 MG tablet, Take 1-2 tablets (5-10 mg total) by mouth 2 (two) times daily as needed for tight muscles, Disp: 30 tablet, Rfl: 1   nystatin (MYCOSTATIN/NYSTOP) powder, Apply 1 application topically 2 times daily, Disp: 15 g, Rfl: 5   predniSONE (DELTASONE) 10 MG tablet, Take 2 tablets by mouth in the morning with food for 3 days then 1 tablet once daily for 4 days., Disp: 10 tablet, Rfl: 0   valACYclovir (VALTREX) 500 MG tablet, TAKE 1 TABLET BY MOUTH 2 TIMES DAILY FOR 3 DAYS, Disp: 20 tablet, Rfl: 1  Past Medical Problems: Past Medical History:  Diagnosis Date   Abnormal Pap smear    Anemia    BV (bacterial vaginosis)    Cyst    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   Headache(784.0)    otc med prn   History of blood transfusion 05/2011   at [facility]  History of kidney stones    passed stone no surgery required   History of renal stone    HSV infection    Lactating mother 07/04/2011   Migraine    Ovarian cyst    Panic attack    no meds   S/P laparoscopic assisted vaginal hysterectomy (LAVH) 05/12/2017   Seasonal allergies     Past Surgical History: Past Surgical History:  Procedure Laterality Date   ABDOMINAL HYSTERECTOMY     CESAREAN SECTION  03/2009   CESAREAN SECTION  06/24/2011   Procedure: CESAREAN SECTION;  Surgeon: Alwyn Pea, MD;  Location: [facility];  Service: Gynecology;  Laterality: N/A;   LAPAROSCOPIC ASSISTED VAGINAL HYSTERECTOMY  05/12/2017   Procedure: LAPAROSCOPIC  ASSISTED VAGINAL HYSTERECTOMY;  Surgeon: Janyth Contes, MD;  Location: [facility];  Service: Gynecology;;   LAPAROSCOPIC LYSIS OF ADHESIONS  05/12/2017   Procedure: EXTENSIVE LAPAROSCOPIC LYSIS OF ADHESIONS;  Surgeon: Janyth Contes, MD;  Location: [facility];  Service: Gynecology;;   LAPAROTOMY  06/25/2011   Procedure: EXPLORATORY LAPAROTOMY; Repair ileus  Surgeon: Delice Lesch, MD;  Location: [facility];  Service: Gynecology;  Laterality: N/A;    WISDOM TOOTH EXTRACTION  2008, 02/2010    Social History: Social History   Socioeconomic History   Marital status: Divorced    Spouse name: Not on file   Number of children: Not on file   Years of education: Not on file   Highest education level: Not on file  Occupational History   Not on file  Tobacco Use   Smoking status: Never   Smokeless tobacco: Never  Vaping Use   Vaping Use: Never used  Substance and Sexual Activity   Alcohol use: No   Drug use: No   Sexual activity: Yes    Birth control/protection: None  Other Topics Concern   Not on file  Social History Narrative   Not on file   Social Determinants of Health   Financial Resource Strain: Not on file  Food Insecurity: Not on file  Transportation Needs: Not on file  Physical Activity: Not on file  Stress: Not on file  Social Connections: Not on file  Intimate Partner Violence: Not on file    Family History: Family History  Problem Relation Age of Onset   Hypertension Mother    Thrombophlebitis Mother    Anemia Mother    Heart disease Father    Hypertension Father    Diabetes Father    Kidney disease Father    Sickle cell trait Sister    Asperger's syndrome Brother    Lupus Maternal Aunt    Lupus Paternal Aunt    Heart attack Maternal Grandmother    Thrombophlebitis Maternal Grandmother    Transient ischemic attack Maternal Grandmother    Heart disease Maternal Grandmother        MI   Alzheimer's disease Maternal Grandfather    Bipolar disorder  Maternal Grandfather    Heart murmur Maternal Grandfather    Mental illness Maternal Grandfather    Hypotension Neg Hx    Anesthesia problems Neg Hx    Malignant hyperthermia Neg Hx    Pseudochol deficiency Neg Hx     Review of Systems: ROS Denies recent fevers, chills, chest pain, difficulty breathing.  Physical Exam: Vital Signs BP 120/80 (BP Location: Left Arm, Patient Position: Sitting, Cuff Size: Large)    Pulse 65    Ht 5' 2"  (1.575 m)    Wt 164 lb (74.4 kg)  LMP 04/20/2017 (Exact Date)    SpO2 99%    BMI 30.00 kg/m   Physical Exam Constitutional:      General: Not in acute distress.    Appearance: Normal appearance. Not ill-appearing.  HENT:     Head: Normocephalic and atraumatic.  Eyes:     Pupils: Pupils are equal, round. Cardiovascular:     Rate and Rhythm: Normal rate.    Pulses: Normal pulses.  Pulmonary:     Effort: No respiratory distress or increased work of breathing.  Speaks in full sentences. Abdominal:     General: Abdomen is flat. No distension.   Musculoskeletal: Normal range of motion. No lower extremity swelling or edema. No varicosities. Skin:    General: Skin is warm and dry.     Findings: No erythema or rash.  Neurological:     Mental Status: Alert and oriented to person, place, and time.  Psychiatric:        Mood and Affect: Mood normal.        Behavior: Behavior normal.    Assessment/Plan: The patient is scheduled for bilateral breast reduction with Dr. Claudia Desanctis.  Risks, benefits, and alternatives of procedure discussed, questions answered and consent obtained.    Smoking Status: Non-smoker.  Last Mammogram: 03/2015; Results: Pathology revealed fibrocystic changes and PASH.  Recommended next mammogram at age 40 unless development of concerning new masses.  Caprini Score: 3; Risk Factors include: Age, BMI greater than 25, and length of planned surgery. Recommendation for mechanical prophylaxis. Encourage early ambulation.   Pictures  obtained: 05/09/2020  Post-op Rx sent to pharmacy: Oxycodone 5 mg, Zofran.  Patient was provided with the General Surgical Risk consent document and Pain Medication Agreement prior to their appointment.  They had adequate time to read through the risk consent documents and Pain Medication Agreement. We also discussed them in person together during this preop appointment. All of their questions were answered to their satisfaction.  Recommended calling if they have any further questions.  Risk consent form and Pain Medication Agreement to be scanned into patient's chart.  The risk that can be encountered with breast reduction were discussed and include the following but not limited to these:  Breast asymmetry, fluid accumulation, firmness of the breast, inability to breast feed, loss of nipple or areola, skin loss, decrease or no nipple sensation, fat necrosis of the breast tissue, bleeding, infection, healing delay.  There are risks of anesthesia, changes to skin sensation and injury to nerves or blood vessels.  The muscle can be temporarily or permanently injured.  You may have an allergic reaction to tape, suture, glue, blood products which can result in skin discoloration, swelling, pain, skin lesions, poor healing.  Any of these can lead to the need for revisonal surgery or stage procedures.  A reduction has potential to interfere with diagnostic procedures.  Nipple or breast piercing can increase risks of infection.  This procedure is best done when the breast is fully developed.  Changes in the breast will continue to occur over time.  Pregnancy can alter the outcomes of previous breast reduction surgery, weight gain and weigh loss can also effect the long term appearance.     Electronically signed by: Krista Blue, PA-C 02/26/2021 3:30 PM

## 2021-02-24 NOTE — H&P (View-Only) (Signed)
Patient ID: [name], female    DOB: [birth year], 36 y.o.   MRN: [id number]  Chief Complaint  Patient presents with   Pre-op Exam      ICD-10-CM   1. Macromastia  N62        History of Present Illness: [name] is a 36 y.o.  female  with a history of macromastia.  She presents for preoperative evaluation for upcoming procedure, bilateral breast reduction, scheduled for 03/18/2021 with Dr. Claudia Desanctis.  The patient has not had problems with anesthesia.  She is a non-smoker.  Denies personal or family history of blood clots or clotting disorder.  No recent hospitalizations or traumas.  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  She will hold her vitamin D supplement 1 week prior to surgery.  Sulfa allergy, but no other known drug allergies.  Denies any history of keloiding or hypertrophic scarring.  Summary of Previous Visit: Patient was seen for initial consult for her macromastia 05/09/2020.  She complained of chronic back and neck discomfort as well as intermittent inframammary rashes.  She reported that she was a DDD and would like to be a C cup.  Denies any family history of breast cancer.  She personally had previous breast biopsies in the past that were benign scar tissue.  STN 31 cm bilaterally.  Job: ICU nurse tech.  Discussed between 4 to 6 weeks FMLA.  PMH Significant for: Symptomatic mammary hypertrophy, H/O cesarean section, laparoscopic hysterectomy, postoperative ileus.   Past Medical History: Allergies: Allergies  Allergen Reactions   Azo [Phenazopyridine] Shortness Of Breath, Swelling and Rash    Lips swell   Sulfamethoxazole Hives and Itching    Current Medications:  Current Outpatient Medications:    Cholecalciferol 1.25 MG (50000 UT) capsule, Take 1 capsule (50,000 Units total) by mouth every 7 (seven) days., Disp: 12 capsule, Rfl: 1   baclofen (LIORESAL) 20 MG tablet, Take 1 tablet (20 mg total) by mouth every 8  (eight) hours as needed for 10 days, Disp: 30 tablet, Rfl: 0   Boric Acid Vaginal 600 MG SUPP, Place 1 capsule  vaginally at bedtime for 10 days, then 2 to 3 times weekly for 3 months (Patient not taking: Reported on 02/26/2021), Disp: 20 suppository, Rfl: 1   clotrimazole-betamethasone (LOTRISONE) cream, Apply 1 application topically 2 times daily, Disp: 60 g, Rfl: 1   COVID-19 At Home Antigen Test (QUICKVUE AT-HOME COVID-19 TEST) KIT, Use as directed., Disp: 4 each, Rfl: 0   cyclobenzaprine (FLEXERIL) 5 MG tablet, Take 1-2 tablets (5-10 mg total) by mouth 2 (two) times daily as needed for tight muscles, Disp: 30 tablet, Rfl: 1   nystatin (MYCOSTATIN/NYSTOP) powder, Apply 1 application topically 2 times daily, Disp: 15 g, Rfl: 5   predniSONE (DELTASONE) 10 MG tablet, Take 2 tablets by mouth in the morning with food for 3 days then 1 tablet once daily for 4 days., Disp: 10 tablet, Rfl: 0   valACYclovir (VALTREX) 500 MG tablet, TAKE 1 TABLET BY MOUTH 2 TIMES DAILY FOR 3 DAYS, Disp: 20 tablet, Rfl: 1  Past Medical Problems: Past Medical History:  Diagnosis Date   Abnormal Pap smear    Anemia    BV (bacterial vaginosis)    Cyst    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   Headache(784.0)    otc med prn   History of blood transfusion 05/2011   at [facility]  History of kidney stones    passed stone no surgery required   History of renal stone    HSV infection    Lactating mother 07/04/2011   Migraine    Ovarian cyst    Panic attack    no meds   S/P laparoscopic assisted vaginal hysterectomy (LAVH) 05/12/2017   Seasonal allergies     Past Surgical History: Past Surgical History:  Procedure Laterality Date   ABDOMINAL HYSTERECTOMY     CESAREAN SECTION  03/2009   CESAREAN SECTION  06/24/2011   Procedure: CESAREAN SECTION;  Surgeon: Alwyn Pea, MD;  Location: [facility];  Service: Gynecology;  Laterality: N/A;   LAPAROSCOPIC ASSISTED VAGINAL HYSTERECTOMY  05/12/2017   Procedure: LAPAROSCOPIC  ASSISTED VAGINAL HYSTERECTOMY;  Surgeon: Janyth Contes, MD;  Location: [facility];  Service: Gynecology;;   LAPAROSCOPIC LYSIS OF ADHESIONS  05/12/2017   Procedure: EXTENSIVE LAPAROSCOPIC LYSIS OF ADHESIONS;  Surgeon: Janyth Contes, MD;  Location: [facility];  Service: Gynecology;;   LAPAROTOMY  06/25/2011   Procedure: EXPLORATORY LAPAROTOMY; Repair ileus  Surgeon: Delice Lesch, MD;  Location: [facility];  Service: Gynecology;  Laterality: N/A;    WISDOM TOOTH EXTRACTION  2008, 02/2010    Social History: Social History   Socioeconomic History   Marital status: Divorced    Spouse name: Not on file   Number of children: Not on file   Years of education: Not on file   Highest education level: Not on file  Occupational History   Not on file  Tobacco Use   Smoking status: Never   Smokeless tobacco: Never  Vaping Use   Vaping Use: Never used  Substance and Sexual Activity   Alcohol use: No   Drug use: No   Sexual activity: Yes    Birth control/protection: None  Other Topics Concern   Not on file  Social History Narrative   Not on file   Social Determinants of Health   Financial Resource Strain: Not on file  Food Insecurity: Not on file  Transportation Needs: Not on file  Physical Activity: Not on file  Stress: Not on file  Social Connections: Not on file  Intimate Partner Violence: Not on file    Family History: Family History  Problem Relation Age of Onset   Hypertension Mother    Thrombophlebitis Mother    Anemia Mother    Heart disease Father    Hypertension Father    Diabetes Father    Kidney disease Father    Sickle cell trait Sister    Asperger's syndrome Brother    Lupus Maternal Aunt    Lupus Paternal Aunt    Heart attack Maternal Grandmother    Thrombophlebitis Maternal Grandmother    Transient ischemic attack Maternal Grandmother    Heart disease Maternal Grandmother        MI   Alzheimer's disease Maternal Grandfather    Bipolar disorder  Maternal Grandfather    Heart murmur Maternal Grandfather    Mental illness Maternal Grandfather    Hypotension Neg Hx    Anesthesia problems Neg Hx    Malignant hyperthermia Neg Hx    Pseudochol deficiency Neg Hx     Review of Systems: ROS Denies recent fevers, chills, chest pain, difficulty breathing.  Physical Exam: Vital Signs BP 120/80 (BP Location: Left Arm, Patient Position: Sitting, Cuff Size: Large)    Pulse 65    Ht 5' 2"  (1.575 m)    Wt 164 lb (74.4 kg)  LMP 04/20/2017 (Exact Date)    SpO2 99%    BMI 30.00 kg/m   Physical Exam Constitutional:      General: Not in acute distress.    Appearance: Normal appearance. Not ill-appearing.  HENT:     Head: Normocephalic and atraumatic.  Eyes:     Pupils: Pupils are equal, round. Cardiovascular:     Rate and Rhythm: Normal rate.    Pulses: Normal pulses.  Pulmonary:     Effort: No respiratory distress or increased work of breathing.  Speaks in full sentences. Abdominal:     General: Abdomen is flat. No distension.   Musculoskeletal: Normal range of motion. No lower extremity swelling or edema. No varicosities. Skin:    General: Skin is warm and dry.     Findings: No erythema or rash.  Neurological:     Mental Status: Alert and oriented to person, place, and time.  Psychiatric:        Mood and Affect: Mood normal.        Behavior: Behavior normal.    Assessment/Plan: The patient is scheduled for bilateral breast reduction with Dr. Claudia Desanctis.  Risks, benefits, and alternatives of procedure discussed, questions answered and consent obtained.    Smoking Status: Non-smoker.  Last Mammogram: 03/2015; Results: Pathology revealed fibrocystic changes and PASH.  Recommended next mammogram at age 35 unless development of concerning new masses.  Caprini Score: 3; Risk Factors include: Age, BMI greater than 25, and length of planned surgery. Recommendation for mechanical prophylaxis. Encourage early ambulation.   Pictures  obtained: 05/09/2020  Post-op Rx sent to pharmacy: Oxycodone 5 mg, Zofran.  Patient was provided with the General Surgical Risk consent document and Pain Medication Agreement prior to their appointment.  They had adequate time to read through the risk consent documents and Pain Medication Agreement. We also discussed them in person together during this preop appointment. All of their questions were answered to their satisfaction.  Recommended calling if they have any further questions.  Risk consent form and Pain Medication Agreement to be scanned into patient's chart.  The risk that can be encountered with breast reduction were discussed and include the following but not limited to these:  Breast asymmetry, fluid accumulation, firmness of the breast, inability to breast feed, loss of nipple or areola, skin loss, decrease or no nipple sensation, fat necrosis of the breast tissue, bleeding, infection, healing delay.  There are risks of anesthesia, changes to skin sensation and injury to nerves or blood vessels.  The muscle can be temporarily or permanently injured.  You may have an allergic reaction to tape, suture, glue, blood products which can result in skin discoloration, swelling, pain, skin lesions, poor healing.  Any of these can lead to the need for revisonal surgery or stage procedures.  A reduction has potential to interfere with diagnostic procedures.  Nipple or breast piercing can increase risks of infection.  This procedure is best done when the breast is fully developed.  Changes in the breast will continue to occur over time.  Pregnancy can alter the outcomes of previous breast reduction surgery, weight gain and weigh loss can also effect the long term appearance.     Electronically signed by: Krista Blue, PA-C 02/26/2021 3:30 PM

## 2021-02-26 ENCOUNTER — Other Ambulatory Visit: Payer: Self-pay

## 2021-02-26 ENCOUNTER — Encounter: Payer: Self-pay | Admitting: Physician Assistant

## 2021-02-26 ENCOUNTER — Ambulatory Visit (INDEPENDENT_AMBULATORY_CARE_PROVIDER_SITE_OTHER): Payer: 59 | Admitting: Physician Assistant

## 2021-02-26 ENCOUNTER — Other Ambulatory Visit (HOSPITAL_COMMUNITY): Payer: Self-pay

## 2021-02-26 VITALS — BP 120/80 | HR 65 | Ht 62.0 in | Wt 164.0 lb

## 2021-02-26 DIAGNOSIS — N62 Hypertrophy of breast: Secondary | ICD-10-CM

## 2021-02-26 MED ORDER — ONDANSETRON 4 MG PO TBDP
4.0000 mg | ORAL_TABLET | Freq: Three times a day (TID) | ORAL | 0 refills | Status: DC | PRN
  Filled 2021-02-26 – 2021-03-06 (×2): qty 20, 7d supply, fill #0

## 2021-02-26 MED ORDER — OXYCODONE HCL 5 MG PO TABS
5.0000 mg | ORAL_TABLET | Freq: Four times a day (QID) | ORAL | 0 refills | Status: AC | PRN
  Filled 2021-02-26 – 2021-03-06 (×2): qty 20, 5d supply, fill #0

## 2021-02-28 DIAGNOSIS — F4323 Adjustment disorder with mixed anxiety and depressed mood: Secondary | ICD-10-CM | POA: Diagnosis not present

## 2021-03-06 ENCOUNTER — Other Ambulatory Visit (HOSPITAL_COMMUNITY): Payer: Self-pay

## 2021-03-07 ENCOUNTER — Other Ambulatory Visit: Payer: Self-pay

## 2021-03-07 ENCOUNTER — Encounter (HOSPITAL_BASED_OUTPATIENT_CLINIC_OR_DEPARTMENT_OTHER): Payer: Self-pay | Admitting: Plastic Surgery

## 2021-03-07 DIAGNOSIS — F4323 Adjustment disorder with mixed anxiety and depressed mood: Secondary | ICD-10-CM | POA: Diagnosis not present

## 2021-03-14 DIAGNOSIS — F4323 Adjustment disorder with mixed anxiety and depressed mood: Secondary | ICD-10-CM | POA: Diagnosis not present

## 2021-03-14 NOTE — Progress Notes (Signed)
Surgical soap given with instructions, pt verbalized understanding.  

## 2021-03-18 ENCOUNTER — Other Ambulatory Visit: Payer: Self-pay

## 2021-03-18 ENCOUNTER — Ambulatory Visit (HOSPITAL_BASED_OUTPATIENT_CLINIC_OR_DEPARTMENT_OTHER)
Admission: RE | Admit: 2021-03-18 | Discharge: 2021-03-18 | Disposition: A | Discharge status: Home / Self Care | Payer: 59 | Attending: Plastic Surgery | Admitting: Plastic Surgery

## 2021-03-18 ENCOUNTER — Ambulatory Visit (HOSPITAL_BASED_OUTPATIENT_CLINIC_OR_DEPARTMENT_OTHER): Payer: 59 | Admitting: Certified Registered"

## 2021-03-18 ENCOUNTER — Encounter (HOSPITAL_BASED_OUTPATIENT_CLINIC_OR_DEPARTMENT_OTHER): Payer: Self-pay | Admitting: Plastic Surgery

## 2021-03-18 ENCOUNTER — Encounter (HOSPITAL_BASED_OUTPATIENT_CLINIC_OR_DEPARTMENT_OTHER)
Admission: RE | Disposition: A | Discharge status: Home / Self Care | Payer: Self-pay | Source: Home / Self Care | Attending: Plastic Surgery

## 2021-03-18 DIAGNOSIS — N6012 Diffuse cystic mastopathy of left breast: Secondary | ICD-10-CM | POA: Diagnosis not present

## 2021-03-18 DIAGNOSIS — M545 Low back pain, unspecified: Secondary | ICD-10-CM

## 2021-03-18 DIAGNOSIS — Z90710 Acquired absence of both cervix and uterus: Secondary | ICD-10-CM | POA: Insufficient documentation

## 2021-03-18 DIAGNOSIS — M546 Pain in thoracic spine: Secondary | ICD-10-CM

## 2021-03-18 DIAGNOSIS — N62 Hypertrophy of breast: Secondary | ICD-10-CM | POA: Insufficient documentation

## 2021-03-18 DIAGNOSIS — Z882 Allergy status to sulfonamides status: Secondary | ICD-10-CM | POA: Insufficient documentation

## 2021-03-18 HISTORY — PX: BREAST REDUCTION SURGERY: SHX8

## 2021-03-18 SURGERY — MAMMOPLASTY, REDUCTION
Anesthesia: General | Site: Breast | Laterality: Bilateral

## 2021-03-18 MED ORDER — AMISULPRIDE (ANTIEMETIC) 5 MG/2ML IV SOLN: 10.0000 mg | Freq: Once | INTRAVENOUS | Status: DC | PRN

## 2021-03-18 MED ORDER — EPINEPHRINE PF 1 MG/ML IJ SOLN
INTRAMUSCULAR | Status: AC
  Filled 2021-03-18: qty 1

## 2021-03-18 MED ORDER — DEXAMETHASONE SODIUM PHOSPHATE 4 MG/ML IJ SOLN
INTRAMUSCULAR | Status: DC | PRN
  Administered 2021-03-18: 10 mg via INTRAVENOUS

## 2021-03-18 MED ORDER — EPHEDRINE SULFATE (PRESSORS) 50 MG/ML IJ SOLN
INTRAMUSCULAR | Status: DC | PRN
  Administered 2021-03-18: 10 mg via INTRAVENOUS

## 2021-03-18 MED ORDER — CHLORHEXIDINE GLUCONATE CLOTH 2 % EX PADS: 6.0000 | MEDICATED_PAD | Freq: Once | CUTANEOUS | Status: DC

## 2021-03-18 MED ORDER — OXYCODONE HCL 5 MG/5ML PO SOLN: 5.0000 mg | Freq: Once | ORAL | Status: AC | PRN

## 2021-03-18 MED ORDER — PROMETHAZINE HCL 25 MG/ML IJ SOLN: 6.2500 mg | INTRAMUSCULAR | Status: DC | PRN

## 2021-03-18 MED ORDER — LIDOCAINE HCL (CARDIAC) PF 100 MG/5ML IV SOSY
PREFILLED_SYRINGE | INTRAVENOUS | Status: DC | PRN
Start: 2021-03-18 — End: 2021-03-18
  Administered 2021-03-18: 60 mg via INTRAVENOUS

## 2021-03-18 MED ORDER — CEFAZOLIN SODIUM-DEXTROSE 2-4 GM/100ML-% IV SOLN
INTRAVENOUS | Status: AC
  Filled 2021-03-18: qty 100

## 2021-03-18 MED ORDER — HYDROMORPHONE HCL 1 MG/ML IJ SOLN
0.2500 mg | INTRAMUSCULAR | Status: DC | PRN
  Administered 2021-03-18: 0.5 mg via INTRAVENOUS

## 2021-03-18 MED ORDER — ONDANSETRON HCL 4 MG/2ML IJ SOLN
INTRAMUSCULAR | Status: AC
  Filled 2021-03-18: qty 2

## 2021-03-18 MED ORDER — PROPOFOL 10 MG/ML IV BOLUS
INTRAVENOUS | Status: DC | PRN
  Administered 2021-03-18: 200 mg via INTRAVENOUS

## 2021-03-18 MED ORDER — BUPIVACAINE HCL (PF) 0.25 % IJ SOLN
INTRAMUSCULAR | Status: AC
  Filled 2021-03-18: qty 90

## 2021-03-18 MED ORDER — CEFAZOLIN SODIUM-DEXTROSE 2-4 GM/100ML-% IV SOLN
2.0000 g | INTRAVENOUS | Status: AC
  Administered 2021-03-18: 2 g via INTRAVENOUS

## 2021-03-18 MED ORDER — 0.9 % SODIUM CHLORIDE (POUR BTL) OPTIME
TOPICAL | Status: DC | PRN
  Administered 2021-03-18: 200 mL

## 2021-03-18 MED ORDER — MIDAZOLAM HCL 2 MG/2ML IJ SOLN
INTRAMUSCULAR | Status: AC
  Filled 2021-03-18: qty 2

## 2021-03-18 MED ORDER — MEPERIDINE HCL 25 MG/ML IJ SOLN: 6.2500 mg | INTRAMUSCULAR | Status: DC | PRN

## 2021-03-18 MED ORDER — EPINEPHRINE PF 1 MG/ML IJ SOLN
INTRAMUSCULAR | Status: DC | PRN
  Administered 2021-03-18: 1001 mL

## 2021-03-18 MED ORDER — ONDANSETRON HCL 4 MG/2ML IJ SOLN
INTRAMUSCULAR | Status: DC | PRN
  Administered 2021-03-18: 4 mg via INTRAVENOUS

## 2021-03-18 MED ORDER — FENTANYL CITRATE (PF) 100 MCG/2ML IJ SOLN
INTRAMUSCULAR | Status: DC | PRN
  Administered 2021-03-18 (×4): 50 ug via INTRAVENOUS

## 2021-03-18 MED ORDER — MIDAZOLAM HCL 5 MG/5ML IJ SOLN
INTRAMUSCULAR | Status: DC | PRN
  Administered 2021-03-18: 2 mg via INTRAVENOUS

## 2021-03-18 MED ORDER — OXYCODONE HCL 5 MG PO TABS
ORAL_TABLET | ORAL | Status: AC
  Filled 2021-03-18: qty 1

## 2021-03-18 MED ORDER — FENTANYL CITRATE (PF) 100 MCG/2ML IJ SOLN
INTRAMUSCULAR | Status: AC
  Filled 2021-03-18: qty 2

## 2021-03-18 MED ORDER — LACTATED RINGERS IV SOLN: INTRAVENOUS | Status: DC

## 2021-03-18 MED ORDER — HYDROMORPHONE HCL 1 MG/ML IJ SOLN
INTRAMUSCULAR | Status: AC
  Filled 2021-03-18: qty 0.5

## 2021-03-18 MED ORDER — OXYCODONE HCL 5 MG PO TABS
5.0000 mg | ORAL_TABLET | Freq: Once | ORAL | Status: AC | PRN
  Administered 2021-03-18: 5 mg via ORAL

## 2021-03-18 SURGICAL SUPPLY — 60 items
APL PRP STRL LF DISP 70% ISPRP (MISCELLANEOUS) ×2
APL SKNCLS STERI-STRIP NONHPOA (GAUZE/BANDAGES/DRESSINGS) ×2
BENZOIN TINCTURE PRP APPL 2/3 (GAUZE/BANDAGES/DRESSINGS) ×4 IMPLANT
BLADE SURG 10 STRL SS (BLADE) ×4 IMPLANT
BNDG CMPR MED 10X6 ELC LF (GAUZE/BANDAGES/DRESSINGS) ×1
BNDG ELASTIC 6X10 VLCR STRL LF (GAUZE/BANDAGES/DRESSINGS) ×2 IMPLANT
CANISTER SUCT 1200ML W/VALVE (MISCELLANEOUS) ×2 IMPLANT
CHLORAPREP W/TINT 26 (MISCELLANEOUS) ×4 IMPLANT
COVER BACK TABLE 60X90IN (DRAPES) ×2 IMPLANT
COVER MAYO STAND STRL (DRAPES) ×2 IMPLANT
DRAIN PENROSE .5X12 LATEX STL (DRAIN) IMPLANT
DRAPE LAPAROSCOPIC ABDOMINAL (DRAPES) ×2 IMPLANT
DRAPE UTILITY XL STRL (DRAPES) ×2 IMPLANT
DRSG PAD ABDOMINAL 8X10 ST (GAUZE/BANDAGES/DRESSINGS) ×8 IMPLANT
ELECT REM PT RETURN 9FT ADLT (ELECTROSURGICAL) ×2
ELECTRODE REM PT RTRN 9FT ADLT (ELECTROSURGICAL) ×1 IMPLANT
GAUZE SPONGE 4X4 12PLY STRL (GAUZE/BANDAGES/DRESSINGS) ×4 IMPLANT
GLOVE SRG 8 PF TXTR STRL LF DI (GLOVE) ×1 IMPLANT
GLOVE SURG ENC MOIS LTX SZ7.5 (GLOVE) ×2 IMPLANT
GLOVE SURG ENC TEXT LTX SZ7.5 (GLOVE) ×2 IMPLANT
GLOVE SURG POLYISO LF SZ7 (GLOVE) ×1 IMPLANT
GLOVE SURG UNDER POLY LF SZ7 (GLOVE) ×2 IMPLANT
GLOVE SURG UNDER POLY LF SZ8 (GLOVE) ×2
GOWN STRL REUS W/ TWL LRG LVL3 (GOWN DISPOSABLE) ×1 IMPLANT
GOWN STRL REUS W/ TWL XL LVL3 (GOWN DISPOSABLE) ×2 IMPLANT
GOWN STRL REUS W/TWL LRG LVL3 (GOWN DISPOSABLE) ×2
GOWN STRL REUS W/TWL XL LVL3 (GOWN DISPOSABLE) ×4
MARKER SKIN DUAL TIP RULER LAB (MISCELLANEOUS) IMPLANT
NDL FILTER BLUNT 18X1 1/2 (NEEDLE) ×1 IMPLANT
NDL HYPO 25X1 1.5 SAFETY (NEEDLE) IMPLANT
NDL SAFETY ECLIPSE 18X1.5 (NEEDLE) ×1 IMPLANT
NDL SPNL 18GX3.5 QUINCKE PK (NEEDLE) ×1 IMPLANT
NEEDLE FILTER BLUNT 18X 1/2SAF (NEEDLE) ×1
NEEDLE FILTER BLUNT 18X1 1/2 (NEEDLE) ×1 IMPLANT
NEEDLE HYPO 18GX1.5 SHARP (NEEDLE) ×2
NEEDLE HYPO 25X1 1.5 SAFETY (NEEDLE) IMPLANT
NEEDLE SPNL 18GX3.5 QUINCKE PK (NEEDLE) ×2 IMPLANT
NS IRRIG 1000ML POUR BTL (IV SOLUTION) ×2 IMPLANT
PACK BASIN DAY SURGERY FS (CUSTOM PROCEDURE TRAY) ×2 IMPLANT
PENCIL SMOKE EVACUATOR (MISCELLANEOUS) ×2 IMPLANT
SLEEVE SCD COMPRESS KNEE MED (STOCKING) ×2 IMPLANT
SPONGE T-LAP 18X18 ~~LOC~~+RFID (SPONGE) ×6 IMPLANT
STAPLER INSORB 30 2030 C-SECTI (MISCELLANEOUS) ×3 IMPLANT
STAPLER VISISTAT 35W (STAPLE) ×2 IMPLANT
STRIP SUTURE WOUND CLOSURE 1/2 (MISCELLANEOUS) ×6 IMPLANT
SUT MNCRL AB 4-0 PS2 18 (SUTURE) ×8 IMPLANT
SUT PDS 3-0 CT2 (SUTURE) ×4
SUT PDS II 3-0 CT2 27 ABS (SUTURE) ×3 IMPLANT
SUT VIC AB 3-0 PS1 18 (SUTURE)
SUT VIC AB 3-0 PS1 18XBRD (SUTURE) IMPLANT
SUT VLOC 180 P-14 24 (SUTURE) ×5 IMPLANT
SYR 50ML LL SCALE MARK (SYRINGE) ×1 IMPLANT
SYR BULB IRRIG 60ML STRL (SYRINGE) ×2 IMPLANT
SYR CONTROL 10ML LL (SYRINGE) ×1 IMPLANT
TAPE MEASURE VINYL STERILE (MISCELLANEOUS) IMPLANT
TOWEL GREEN STERILE FF (TOWEL DISPOSABLE) ×4 IMPLANT
TUBE CONNECTING 20X1/4 (TUBING) ×2 IMPLANT
TUBING INFILTRATION IT-10001 (TUBING) ×2 IMPLANT
UNDERPAD 30X36 HEAVY ABSORB (UNDERPADS AND DIAPERS) ×4 IMPLANT
YANKAUER SUCT BULB TIP NO VENT (SUCTIONS) ×2 IMPLANT

## 2021-03-18 NOTE — Anesthesia Postprocedure Evaluation (Signed)
Anesthesia Post Note  Patient: [name]  Procedure(s) Performed: MAMMARY REDUCTION  (BREAST) (Bilateral: Breast)     Patient location during evaluation: PACU Anesthesia Type: General Level of consciousness: awake and alert Pain management: pain level controlled Vital Signs Assessment: post-procedure vital signs reviewed and stable Respiratory status: spontaneous breathing, nonlabored ventilation and respiratory function stable Cardiovascular status: blood pressure returned to baseline and stable Postop Assessment: no apparent nausea or vomiting Anesthetic complications: no   No notable events documented.  Last Vitals:  Vitals:   03/18/21 1230 03/18/21 1249  BP: 126/80 (!) 128/96  Pulse: 82 98  Resp: 15 16  Temp:  36.7 C  SpO2: 99% 98%    Last Pain:  Vitals:   03/18/21 1249  TempSrc: Oral  PainSc: 3                  Lynda Rainwater

## 2021-03-18 NOTE — Op Note (Signed)
Operative Note   DATE OF OPERATION: 03/18/2021  LOCATION: [facility]   SURGICAL DEPARTMENT: Plastic Surgery  PREOPERATIVE DIAGNOSES: Bilateral symptomatic macromastia.  POSTOPERATIVE DIAGNOSES:  same  PROCEDURE: Bilateral breast reduction with superomedial pedicle.  SURGEON: Ancil Linsey, MD  ASSISTANT: Evelena Leyden, PA The advanced practice practitioner (APP) assisted throughout the case.  The APP was essential in retraction and counter traction when needed to make the case progress smoothly.  This retraction and assistance made it possible to see the tissue planes for the procedure.  The assistance was needed for hemostasis, tissue re-approximation and closure of the incision site.   ANESTHESIA: General.  COMPLICATIONS: None.   INDICATIONS FOR PROCEDURE:  The patient, [name] is a 36 y.o. female born on 1985/02/05, is here for treatment of bilateral symptomatic macromastia. MRN: [id number]  CONSENT:  Informed consent was obtained directly from the patient. Risks, benefits and alternatives were fully discussed. Specific risks including but not limited to bleeding, infection, hematoma, seroma, scarring, pain, infection, contracture, asymmetry, wound healing problems, and need for further surgery were all discussed. The patient did have an ample opportunity to have questions answered to satisfaction.   DESCRIPTION OF PROCEDURE:  The patient was marked preoperatively for a Wise pattern skin excision.  The patient was taken to the operating room. SCDs were placed and antibiotics were given. General anesthesia was administered.The patient's operative site was prepped and draped in a sterile fashion. A time out was performed and all information was confirmed to be correct.  Right Breast: The breast was infiltrated with tumescent solution to help with hemostasis.  The nipple was marked with a cookie cutter.  A superomedial pedicle was drawn out with the base  of at least 8 cm in size.  A breast tourniquet was then applied and the pedicle was de-epithelialized.  Breast tourniquet was then let down and all incisions were made with a 10 blade.  The pedicle was then isolated down to the chest wall with cautery and the excision was performed removing tissue primarily inferiorly and laterally.  Hemostasis was obtained and the wound was stapled closed.  Left breast:  The breast was infiltrated with tumescent solution to help with hemostasis.  The nipple was marked with a cookie cutter.  A superomedial pedicle was drawn out with the base of at least 8 cm in size.  A breast tourniquet was then applied and the pedicle was de-epithelialized.  Breast tourniquet was then let down and all incisions were made with a 10 blade.  The pedicle was then isolated down to the chest wall with cautery and the excision was performed removing tissue primarily inferiorly and laterally.  Hemostasis was obtained and the wound was stapled closed.  Patient was then set up to check for size and symmetry.  Minor modifications were made.  This resulted in a total of 446g removed from the right side and 559g removed from the left side.  The inframammary incision was closed with a combination of buried in-sorb staples and a running 3-0 Quill suture.  The vertical and periareolar limbs were closed with interrupted buried 4-0 Monocryl and a running 4-0 Quill suture.  Steri-Strips were then applied along with a soft dressing and Ace wrap.  The patient tolerated the procedure well.  There were no complications. The patient was allowed to wake from anesthesia, extubated and taken to the recovery room in satisfactory condition.  I was present for the entire procedure.

## 2021-03-18 NOTE — Interval H&P Note (Signed)
Patient seen and examined. Risks and benefits discussed. Proceed with surgery.

## 2021-03-18 NOTE — Discharge Instructions (Addendum)
Activity: Avoid strenuous activity.  No heavy lifting.  Diet: No restrictions.  Try to optimize nutrition with plenty of fruits and vegetables to improve healing.  Wound Care: Leave ACE wrap for 3 days.  You may then remove and shower normally.  After ACE wrap comes off recommend sports bra for gentle compression.  Avoid any bra with under-wire until cleared by Dr. Arita Miss.  Follow-Up: Scheduled for next week.  Things to watch for:  Call the office if you experience fever, chills, persistent nausea, or significant bleeding.  Mild wound drainage is common after breast reduction surgery and should not be cause for alarm.   Post Anesthesia Home Care Instructions  Activity: Get plenty of rest for the remainder of the day. A responsible individual must stay with you for 24 hours following the procedure.  For the next 24 hours, DO NOT: -Drive a car -Advertising copywriter -Drink alcoholic beverages -Take any medication unless instructed by your physician -Make any legal decisions or sign important papers.  Meals: Start with liquid foods such as gelatin or soup. Progress to regular foods as tolerated. Avoid greasy, spicy, heavy foods. If nausea and/or vomiting occur, drink only clear liquids until the nausea and/or vomiting subsides. Call your physician if vomiting continues.  Special Instructions/Symptoms: Your throat may feel dry or sore from the anesthesia or the breathing tube placed in your throat during surgery. If this causes discomfort, gargle with warm salt water. The discomfort should disappear within 24 hours.  If you had a scopolamine patch placed behind your ear for the management of post- operative nausea and/or vomiting:  1. The medication in the patch is effective for 72 hours, after which it should be removed.  Wrap patch in a tissue and discard in the trash. Wash hands thoroughly with soap and water. 2. You may remove the patch earlier than 72 hours if you experience unpleasant side  effects which may include dry mouth, dizziness or visual disturbances. 3. Avoid touching the patch. Wash your hands with soap and water after contact with the patch.     Oxycodone given today at 12:50pm

## 2021-03-18 NOTE — Transfer of Care (Signed)
Immediate Anesthesia Transfer of Care Note  Patient: [name]  Procedure(s) Performed: MAMMARY REDUCTION  (BREAST) (Bilateral: Breast)  Patient Location: PACU  Anesthesia Type:General  Level of Consciousness: awake  Airway & Oxygen Therapy: Patient Spontanous Breathing and Patient connected to face mask oxygen  Post-op Assessment: Report given to RN and Post -op Vital signs reviewed and stable  Post vital signs: Reviewed and stable  Last Vitals:  Vitals Value Taken Time  BP 139/92 03/18/21 1202  Temp    Pulse 102 03/18/21 1203  Resp 20 03/18/21 1203  SpO2 100 % 03/18/21 1203  Vitals shown include unvalidated device data.  Last Pain:  Vitals:   03/18/21 0842  TempSrc: Oral  PainSc: 0-No pain      Patients Stated Pain Goal: 3 (03/18/21 7096)  Complications: No notable events documented.

## 2021-03-18 NOTE — Anesthesia Preprocedure Evaluation (Signed)
Anesthesia Evaluation  Patient identified by MRN, date of birth, ID band Patient awake    Reviewed: Allergy & Precautions, H&P , NPO status , Patient's Chart, lab work & pertinent test results, reviewed documented beta blocker date and time   Airway Mallampati: II  TM Distance: >3 FB Neck ROM: full    Dental no notable dental hx.    Pulmonary neg pulmonary ROS,    Pulmonary exam normal breath sounds clear to auscultation       Cardiovascular hypertension,  Rhythm:regular Rate:Normal     Neuro/Psych  Headaches, Anxiety negative psych ROS   GI/Hepatic negative GI ROS, Neg liver ROS,   Endo/Other  negative endocrine ROS  Renal/GU negative Renal ROS  negative genitourinary   Musculoskeletal negative musculoskeletal ROS (+)   Abdominal   Peds negative pediatric ROS (+)  Hematology negative hematology ROS (+)   Anesthesia Other Findings   Reproductive/Obstetrics negative OB ROS                             Anesthesia Physical  Anesthesia Plan  ASA: II  Anesthesia Plan: General   Post-op Pain Management:    Induction: Intravenous  PONV Risk Score and Plan: 3 and Treatment may vary due to age or medical condition, Ondansetron, Dexamethasone and Scopolamine patch - Pre-op  Airway Management Planned: LMA  Additional Equipment:   Intra-op Plan:   Post-operative Plan: Extubation in OR  Informed Consent: I have reviewed the patients History and Physical, chart, labs and discussed the procedure including the risks, benefits and alternatives for the proposed anesthesia with the patient or authorized representative who has indicated his/her understanding and acceptance.     Dental Advisory Given  Plan Discussed with: CRNA and Surgeon  Anesthesia Plan Comments: (  )        Anesthesia Quick Evaluation

## 2021-03-18 NOTE — Anesthesia Procedure Notes (Signed)
Procedure Name: LMA Insertion Date/Time: 03/18/2021 10:10 AM Performed by: Tawni Millers, CRNA Pre-anesthesia Checklist: Patient identified, Emergency Drugs available, Suction available and Patient being monitored Patient Re-evaluated:Patient Re-evaluated prior to induction Oxygen Delivery Method: Circle system utilized Preoxygenation: Pre-oxygenation with 100% oxygen Induction Type: IV induction Ventilation: Mask ventilation without difficulty LMA: LMA inserted LMA Size: 3.0 Number of attempts: 1 Airway Equipment and Method: Bite block Placement Confirmation: positive ETCO2 Tube secured with: Tape Dental Injury: Teeth and Oropharynx as per pre-operative assessment

## 2021-03-19 ENCOUNTER — Encounter (HOSPITAL_BASED_OUTPATIENT_CLINIC_OR_DEPARTMENT_OTHER): Payer: Self-pay | Admitting: Plastic Surgery

## 2021-03-19 LAB — SURGICAL PATHOLOGY

## 2021-03-21 DIAGNOSIS — F4323 Adjustment disorder with mixed anxiety and depressed mood: Secondary | ICD-10-CM | POA: Diagnosis not present

## 2021-03-26 ENCOUNTER — Ambulatory Visit (INDEPENDENT_AMBULATORY_CARE_PROVIDER_SITE_OTHER): Payer: 59 | Admitting: Plastic Surgery

## 2021-03-26 ENCOUNTER — Other Ambulatory Visit: Payer: Self-pay

## 2021-03-26 DIAGNOSIS — N62 Hypertrophy of breast: Secondary | ICD-10-CM

## 2021-03-26 NOTE — Progress Notes (Signed)
Patient presents about 1 week out from bilateral breast reduction.  She feels good and is happy.  On exam everything looks to be healing fine with intact incisions and viable nipple areolar complexes.  There is no subcutaneous fluid.  I have asked her to continue compressive garments and avoid strenuous activity.  We will see her again in a few weeks.  

## 2021-03-28 DIAGNOSIS — F4323 Adjustment disorder with mixed anxiety and depressed mood: Secondary | ICD-10-CM | POA: Diagnosis not present

## 2021-04-07 NOTE — Progress Notes (Unsigned)
Patient is a 36 year old female with PMH of macromastia s/p bilateral breast reduction performed 03/18/2021 by Dr. Claudia Desanctis who presents clinic for postoperative follow-up.  She was last seen here in clinic on 03/26/2021.  At that time, exam was reassuring and plan was for continued compressive garments and avoidance of strenuous activity.  Today,

## 2021-04-09 ENCOUNTER — Other Ambulatory Visit: Payer: Self-pay

## 2021-04-09 ENCOUNTER — Ambulatory Visit (INDEPENDENT_AMBULATORY_CARE_PROVIDER_SITE_OTHER): Payer: 59 | Admitting: Physician Assistant

## 2021-04-09 ENCOUNTER — Other Ambulatory Visit (HOSPITAL_COMMUNITY): Payer: Self-pay

## 2021-04-09 DIAGNOSIS — Z9889 Other specified postprocedural states: Secondary | ICD-10-CM

## 2021-04-09 MED ORDER — ONDANSETRON 4 MG PO TBDP
4.0000 mg | ORAL_TABLET | Freq: Three times a day (TID) | ORAL | 0 refills | Status: AC | PRN
  Filled 2021-04-09: qty 20, 7d supply, fill #0

## 2021-04-10 ENCOUNTER — Telehealth: Payer: Self-pay

## 2021-04-10 NOTE — Telephone Encounter (Signed)
Patient called in stating that the [facility] claims manager states that the letter needs to be written to state she is to return on 04/29/21. Fax to Deeann Cree at [phone]. [place] asked for a call back once it has been faxed.  ?

## 2021-04-11 DIAGNOSIS — F4323 Adjustment disorder with mixed anxiety and depressed mood: Secondary | ICD-10-CM | POA: Diagnosis not present

## 2021-04-14 ENCOUNTER — Encounter: Payer: Self-pay | Admitting: Plastic Surgery

## 2021-04-14 NOTE — Telephone Encounter (Signed)
Re-faxed letter with appropriate dates! ?

## 2021-04-15 ENCOUNTER — Other Ambulatory Visit (HOSPITAL_COMMUNITY): Payer: Self-pay

## 2021-04-17 DIAGNOSIS — E663 Overweight: Secondary | ICD-10-CM | POA: Diagnosis not present

## 2021-04-18 DIAGNOSIS — F4323 Adjustment disorder with mixed anxiety and depressed mood: Secondary | ICD-10-CM | POA: Diagnosis not present

## 2021-04-22 NOTE — Progress Notes (Signed)
Patient is a 36 year old female with PMH of macromastia s/p bilateral breast reduction performed 03/18/2021 by Dr. Arita Miss who presents clinic for postoperative follow-up. ? ?Patient was last seen here in clinic on 04/09/2021.  At that time, she was still having some postoperative discomfort, but she was overall pleased with the cosmetic outcome.  Physical exam was entirely reassuring. ? ?Today, patient is doing well.  She states that this morning she woke up and was having some throbbing discomfort on the medial aspect of inframammary incision left breast.  Other than that, she states that everything has been fine, has not been having any pain and her nausea has resolved.  She is interested in scar creams.  She reports that her Steri-Strips still remain firmly intact. ? ?Steri-Strips removed here in clinic without complication or difficulty.  Breasts have excellent shape and symmetry.  No wounds noted on exam.  No areas of drainage.  The area where she complains of a throbbing discomfort that is new is mildly tender to palpation.  There is no erythema or surrounding skin changes.  No fluctuance.  No stark asymmetry compared to contralateral side.   ? ?Suspect that it will improve with NSAIDs.  She had not yet taken anything for her discomfort.  She reports that she had woken up that way so perhaps it was related to how she slept last night.  Doubt infection. ? ?Discussed scar creams.  Patient would likely proceed with Skinuva scar gel.  No specific follow-up warranted, but she is certainly able to call the clinic and return should she have any questions or concerns.  Overall she is very pleased with her outcome and hopes to return later for possible panniculectomy/abdominoplasty. ? ?Picture(s) obtained of the patient and placed in the chart were with the patient's or guardian's permission. ? ?

## 2021-04-23 ENCOUNTER — Encounter: Payer: Self-pay | Admitting: Physician Assistant

## 2021-04-23 ENCOUNTER — Ambulatory Visit (INDEPENDENT_AMBULATORY_CARE_PROVIDER_SITE_OTHER): Payer: 59 | Admitting: Physician Assistant

## 2021-04-23 ENCOUNTER — Other Ambulatory Visit: Payer: Self-pay

## 2021-04-23 DIAGNOSIS — Z719 Counseling, unspecified: Secondary | ICD-10-CM

## 2021-04-23 DIAGNOSIS — Z9889 Other specified postprocedural states: Secondary | ICD-10-CM

## 2021-05-08 ENCOUNTER — Telehealth: Payer: Self-pay | Admitting: Plastic Surgery

## 2021-05-08 NOTE — Telephone Encounter (Signed)
Patient is 6 weeks post OP and patient would like to know if she can take off the surgical bra / compression bra. Please advise.  ?

## 2021-05-08 NOTE — Telephone Encounter (Signed)
Called and spoke with the patient regarding the message below.  Informed the patient that I spoke with Garrett,PA-C and he stated that yes she can take off the compression bra,but switch to a regular bra for now.  Hold off on the under wire bra because of the discomfort she was having with the left breast.  Patient verbalized understanding and agreed.//AB/CMA ?

## 2021-05-09 DIAGNOSIS — F4323 Adjustment disorder with mixed anxiety and depressed mood: Secondary | ICD-10-CM | POA: Diagnosis not present

## 2021-05-12 ENCOUNTER — Other Ambulatory Visit (HOSPITAL_COMMUNITY): Payer: Self-pay

## 2021-05-12 MED ORDER — VALACYCLOVIR HCL 500 MG PO TABS
500.0000 mg | ORAL_TABLET | Freq: Two times a day (BID) | ORAL | 1 refills | Status: AC
  Filled 2021-05-12: qty 30, 15d supply, fill #0
  Filled 2021-07-07: qty 30, 15d supply, fill #1

## 2021-05-16 DIAGNOSIS — F4323 Adjustment disorder with mixed anxiety and depressed mood: Secondary | ICD-10-CM | POA: Diagnosis not present

## 2021-05-20 DIAGNOSIS — F4323 Adjustment disorder with mixed anxiety and depressed mood: Secondary | ICD-10-CM | POA: Diagnosis not present

## 2021-05-27 DIAGNOSIS — F4323 Adjustment disorder with mixed anxiety and depressed mood: Secondary | ICD-10-CM | POA: Diagnosis not present

## 2021-05-28 NOTE — Progress Notes (Signed)
Patient is a 36 year old female here for follow-up after bilateral breast reduction with Dr. Arita Miss on 03/18/2021.  She is 10 weeks postop. ? ?She reports overall she is doing really well, she does have some questions about some ongoing tenderness she is having in bilateral lateral breasts and at the T-junction of the right breast along the inframammary fold.   ? ?Chaperone present on exam ?On exam bilateral NAC's are viable, bilateral breast incisions are intact, healing well.  She has good symmetry.  She does have a small suture protruding through the T-junction of the right breast.  There is no erythema or cellulitic changes of either breast.  No subcutaneous fluid collection noted.  Incisions are well-healed. ? ?Slight tenderness to palpation of bilateral lateral breast, left greater than right.  No overlying skin changes noted.  No fluid collection noted. ? ?Suture was removed from T-junction of the right breast, patient tolerated this well.  Suspect tenderness in this area will now improve with that that has been removed.  Discussed with patient tenderness in the lateral breast will continue to improve over time, no signs of concern on evaluation.  No longer necessary to wear compressive garments 24/7, can begin wearing normal bra in 3 weeks.  Follow-up as needed.  Call with questions or concerns. ?

## 2021-05-29 ENCOUNTER — Ambulatory Visit (INDEPENDENT_AMBULATORY_CARE_PROVIDER_SITE_OTHER): Payer: 59 | Admitting: Surgical

## 2021-05-29 DIAGNOSIS — N62 Hypertrophy of breast: Secondary | ICD-10-CM

## 2021-05-29 DIAGNOSIS — Z9889 Other specified postprocedural states: Secondary | ICD-10-CM

## 2021-06-02 DIAGNOSIS — E559 Vitamin D deficiency, unspecified: Secondary | ICD-10-CM | POA: Diagnosis not present

## 2021-06-03 DIAGNOSIS — F4323 Adjustment disorder with mixed anxiety and depressed mood: Secondary | ICD-10-CM | POA: Diagnosis not present

## 2021-06-13 DIAGNOSIS — F4323 Adjustment disorder with mixed anxiety and depressed mood: Secondary | ICD-10-CM | POA: Diagnosis not present

## 2021-06-20 DIAGNOSIS — F4323 Adjustment disorder with mixed anxiety and depressed mood: Secondary | ICD-10-CM | POA: Diagnosis not present

## 2021-07-04 DIAGNOSIS — F4323 Adjustment disorder with mixed anxiety and depressed mood: Secondary | ICD-10-CM | POA: Diagnosis not present

## 2021-07-07 ENCOUNTER — Other Ambulatory Visit (HOSPITAL_COMMUNITY): Payer: Self-pay

## 2021-07-11 DIAGNOSIS — F4323 Adjustment disorder with mixed anxiety and depressed mood: Secondary | ICD-10-CM | POA: Diagnosis not present

## 2023-07-09 ENCOUNTER — Other Ambulatory Visit: Payer: Self-pay | Admitting: Obstetrics and Gynecology

## 2023-07-09 DIAGNOSIS — R590 Localized enlarged lymph nodes: Secondary | ICD-10-CM

## 2023-07-27 ENCOUNTER — Inpatient Hospital Stay
Admission: RE | Admit: 2023-07-27 | Discharge: 2023-07-27 | Discharge status: Home / Self Care | Payer: Self-pay | Source: Ambulatory Visit | Attending: Obstetrics and Gynecology | Admitting: Obstetrics and Gynecology

## 2023-07-27 ENCOUNTER — Other Ambulatory Visit: Payer: Self-pay | Admitting: Obstetrics and Gynecology

## 2023-07-27 DIAGNOSIS — R590 Localized enlarged lymph nodes: Secondary | ICD-10-CM
